# Patient Record
Sex: Female | Born: 1945 | Race: White | Hispanic: No | Marital: Married | Smoking: Former smoker
Health system: Southern US, Community
[De-identification: ages and names within clinical notes are randomized; demographics above are authoritative.]

## PROBLEM LIST (undated history)

## (undated) DIAGNOSIS — M1990 Unspecified osteoarthritis, unspecified site: Secondary | ICD-10-CM

## (undated) DIAGNOSIS — K5790 Diverticulosis of intestine, part unspecified, without perforation or abscess without bleeding: Secondary | ICD-10-CM

## (undated) DIAGNOSIS — J189 Pneumonia, unspecified organism: Secondary | ICD-10-CM

## (undated) DIAGNOSIS — Z972 Presence of dental prosthetic device (complete) (partial): Secondary | ICD-10-CM

## (undated) DIAGNOSIS — I5189 Other ill-defined heart diseases: Secondary | ICD-10-CM

## (undated) DIAGNOSIS — R7881 Bacteremia: Secondary | ICD-10-CM

## (undated) DIAGNOSIS — K219 Gastro-esophageal reflux disease without esophagitis: Secondary | ICD-10-CM

## (undated) DIAGNOSIS — E119 Type 2 diabetes mellitus without complications: Secondary | ICD-10-CM

## (undated) DIAGNOSIS — E785 Hyperlipidemia, unspecified: Secondary | ICD-10-CM

## (undated) DIAGNOSIS — I2510 Atherosclerotic heart disease of native coronary artery without angina pectoris: Secondary | ICD-10-CM

## (undated) DIAGNOSIS — N3281 Overactive bladder: Secondary | ICD-10-CM

## (undated) DIAGNOSIS — I493 Ventricular premature depolarization: Secondary | ICD-10-CM

## (undated) DIAGNOSIS — I6523 Occlusion and stenosis of bilateral carotid arteries: Secondary | ICD-10-CM

## (undated) DIAGNOSIS — Z8619 Personal history of other infectious and parasitic diseases: Secondary | ICD-10-CM

## (undated) DIAGNOSIS — Z9841 Cataract extraction status, right eye: Secondary | ICD-10-CM

## (undated) DIAGNOSIS — I679 Cerebrovascular disease, unspecified: Secondary | ICD-10-CM

## (undated) DIAGNOSIS — I700 Atherosclerosis of aorta: Secondary | ICD-10-CM

## (undated) DIAGNOSIS — H8110 Benign paroxysmal vertigo, unspecified ear: Secondary | ICD-10-CM

## (undated) DIAGNOSIS — E1140 Type 2 diabetes mellitus with diabetic neuropathy, unspecified: Secondary | ICD-10-CM

## (undated) DIAGNOSIS — I10 Essential (primary) hypertension: Secondary | ICD-10-CM

## (undated) DIAGNOSIS — F32A Depression, unspecified: Secondary | ICD-10-CM

## (undated) DIAGNOSIS — M51369 Other intervertebral disc degeneration, lumbar region without mention of lumbar back pain or lower extremity pain: Secondary | ICD-10-CM

## (undated) DIAGNOSIS — Z7982 Long term (current) use of aspirin: Secondary | ICD-10-CM

## (undated) DIAGNOSIS — B029 Zoster without complications: Secondary | ICD-10-CM

## (undated) DIAGNOSIS — R42 Dizziness and giddiness: Secondary | ICD-10-CM

## (undated) DIAGNOSIS — G9341 Metabolic encephalopathy: Secondary | ICD-10-CM

## (undated) HISTORY — PX: JOINT REPLACEMENT: SHX530

## (undated) HISTORY — PX: TONSILLECTOMY: SUR1361

## (undated) HISTORY — PX: TUBAL LIGATION: SHX77

## (undated) HISTORY — PX: APPENDECTOMY: SHX54

## (undated) HISTORY — PX: BACK SURGERY: SHX140

---

## 2004-05-02 ENCOUNTER — Ambulatory Visit: Payer: Self-pay | Admitting: Obstetrics and Gynecology

## 2004-05-16 ENCOUNTER — Ambulatory Visit: Payer: Self-pay | Admitting: Obstetrics and Gynecology

## 2004-11-20 ENCOUNTER — Ambulatory Visit: Payer: Self-pay | Admitting: Obstetrics and Gynecology

## 2005-05-06 ENCOUNTER — Ambulatory Visit: Payer: Self-pay | Admitting: Obstetrics and Gynecology

## 2006-06-17 ENCOUNTER — Ambulatory Visit: Payer: Self-pay | Admitting: Obstetrics and Gynecology

## 2006-11-24 ENCOUNTER — Ambulatory Visit: Payer: Self-pay | Admitting: Gastroenterology

## 2007-06-19 ENCOUNTER — Ambulatory Visit: Payer: Self-pay | Admitting: Obstetrics and Gynecology

## 2007-06-30 ENCOUNTER — Ambulatory Visit: Payer: Self-pay | Admitting: Obstetrics and Gynecology

## 2008-06-20 ENCOUNTER — Ambulatory Visit: Payer: Self-pay | Admitting: Obstetrics and Gynecology

## 2009-06-21 ENCOUNTER — Ambulatory Visit: Payer: Self-pay | Admitting: Obstetrics and Gynecology

## 2010-04-03 ENCOUNTER — Encounter: Payer: Self-pay | Admitting: Unknown Physician Specialty

## 2010-04-08 ENCOUNTER — Encounter: Payer: Self-pay | Admitting: Unknown Physician Specialty

## 2010-04-16 ENCOUNTER — Ambulatory Visit: Payer: Self-pay | Admitting: Gastroenterology

## 2010-05-08 ENCOUNTER — Encounter: Payer: Self-pay | Admitting: Unknown Physician Specialty

## 2010-06-08 ENCOUNTER — Encounter: Payer: Self-pay | Admitting: Unknown Physician Specialty

## 2010-07-30 ENCOUNTER — Ambulatory Visit: Payer: Self-pay | Admitting: Obstetrics and Gynecology

## 2010-09-19 ENCOUNTER — Emergency Department: Payer: Self-pay | Admitting: Emergency Medicine

## 2010-10-11 ENCOUNTER — Ambulatory Visit: Payer: Self-pay | Admitting: Internal Medicine

## 2010-11-26 ENCOUNTER — Ambulatory Visit: Payer: Self-pay | Admitting: Gastroenterology

## 2010-11-27 ENCOUNTER — Ambulatory Visit: Payer: Self-pay | Admitting: Gastroenterology

## 2011-01-21 ENCOUNTER — Ambulatory Visit: Payer: Self-pay | Admitting: Gastroenterology

## 2011-01-24 LAB — PATHOLOGY REPORT

## 2011-04-22 ENCOUNTER — Ambulatory Visit: Payer: Self-pay | Admitting: Unknown Physician Specialty

## 2011-04-22 LAB — PROTIME-INR: INR: 0.9

## 2011-04-22 LAB — URINALYSIS, COMPLETE
Bacteria: NONE SEEN
Glucose,UR: NEGATIVE mg/dL (ref 0–75)
Ketone: NEGATIVE
Nitrite: NEGATIVE
Protein: NEGATIVE
Specific Gravity: 1.006 (ref 1.003–1.030)
Squamous Epithelial: 1
WBC UR: 4 /HPF (ref 0–5)

## 2011-04-22 LAB — MRSA PCR SCREENING

## 2011-04-22 LAB — BASIC METABOLIC PANEL
Anion Gap: 9 (ref 7–16)
EGFR (Non-African Amer.): >60
Osmolality: 288 (ref 275–301)
Sodium: 143 mmol/L (ref 136–145)

## 2011-04-22 LAB — CBC
MCHC: 33.2 g/dL (ref 32.0–36.0)
RBC: 4.81 10*6/uL (ref 3.80–5.20)
RDW: 13 % (ref 11.5–14.5)

## 2011-04-22 LAB — APTT: Activated PTT: 25.9 secs (ref 23.6–35.9)

## 2011-05-06 ENCOUNTER — Inpatient Hospital Stay: Payer: Self-pay | Admitting: Unknown Physician Specialty

## 2011-05-06 LAB — ELECTROLYTE PANEL: Chloride: 110 mmol/L — ABNORMAL HIGH (ref 98–107)

## 2011-05-07 LAB — BASIC METABOLIC PANEL
Anion Gap: 9 (ref 7–16)
Calcium, Total: 8.1 mg/dL — ABNORMAL LOW (ref 8.5–10.1)
Co2: 26 mmol/L (ref 21–32)
Creatinine: 0.68 mg/dL (ref 0.60–1.30)
EGFR (African American): >60
EGFR (Non-African Amer.): >60
Glucose: 137 mg/dL — ABNORMAL HIGH (ref 65–99)
Osmolality: 276 (ref 275–301)
Potassium: 3.8 mmol/L (ref 3.5–5.1)

## 2011-05-07 LAB — HEMOGLOBIN: HGB: 12.4 g/dL (ref 12.0–16.0)

## 2011-05-08 LAB — HEMOGLOBIN: HGB: 11.1 g/dL — ABNORMAL LOW (ref 12.0–16.0)

## 2011-05-10 ENCOUNTER — Encounter: Payer: Self-pay | Admitting: Internal Medicine

## 2011-07-31 ENCOUNTER — Ambulatory Visit: Payer: Self-pay | Admitting: Obstetrics and Gynecology

## 2012-07-06 ENCOUNTER — Ambulatory Visit: Payer: Self-pay | Admitting: Physical Medicine and Rehabilitation

## 2012-07-31 ENCOUNTER — Ambulatory Visit: Payer: Self-pay | Admitting: Obstetrics and Gynecology

## 2013-05-28 DIAGNOSIS — I10 Essential (primary) hypertension: Secondary | ICD-10-CM | POA: Insufficient documentation

## 2013-08-02 ENCOUNTER — Ambulatory Visit: Payer: Self-pay | Admitting: Obstetrics and Gynecology

## 2013-08-13 DIAGNOSIS — R32 Unspecified urinary incontinence: Secondary | ICD-10-CM | POA: Insufficient documentation

## 2013-08-13 DIAGNOSIS — K219 Gastro-esophageal reflux disease without esophagitis: Secondary | ICD-10-CM | POA: Insufficient documentation

## 2014-05-01 NOTE — Op Note (Signed)
PATIENT NAME:  Madison Cuevas, Madison Cuevas MR#:  [id number] DATE OF BIRTH:  February 17, 1945  DATE OF PROCEDURE:  05/06/2011  PREOPERATIVE DIAGNOSIS: Degenerative arthritis, right knee.   POSTOPERATIVE DIAGNOSIS: Degenerative arthritis, right knee.   OPERATION: Stryker PS total knee replacement on the right.   SURGEON: Alda Berthold., MD   FIRST ASSISTANT: Thompson Grayer, PA-C   ANESTHESIA: Spinal.   HISTORY: The patient had a long history of symptomatic degenerative arthritis of the right knee. She had been refractory to conservative treatment. Due to persistent symptoms despite conservative treatment, she was brought in for total knee replacement on the right.   DESCRIPTION OF PROCEDURE: The patient was taken the Operating Room where satisfactory spinal anesthesia was achieved. A Foley was also inserted.   A tourniquet was applied to her right upper thigh. The right lower extremity was prepped and draped in the usual fashion for a procedure about the knee. The patient incidentally was given 2 grams of Kefzol IV prior to the start of the procedure.   Next, the right lower extremity was exsanguinated and the tourniquet was inflated. I then made a slightly curved longitudinal incision over the anterior aspect of the right knee joint.   Dissection was carried down through the subcutaneous tissue onto the extensor mechanism. I divided the vastus medialis in line with its fibers about a fingerbreadth above the superomedial pole of the patella. The joint was entered. Dissection was carried along the medial border of the patellar and patellar tendon. As the knee was flexed, the patella was everted and dislocated laterally.   Inspection of the knee joint revealed the patient had significant erosion of her medial and lateral femoral condyles and indentations over the medial and lateral tibial plateaus. There were osteophytes about the peripheral aspect of the distal femur and patella and then the  intercondylar notch.   We went in and removed the osteophytes. Meniscal remnants were excised. A 3/8-inch hole was made in the trochlear groove of the distal femur about a centimeter anterior to the insertion of the posterior cruciate ligament onto the distal femur. An intramedullary rod was inserted. On the distal end of the rod was an alignment jig and cutting block.   The distal femoral cutting block was affixed to the femur with smooth pins, and then the intramedullary rod and alignment jig were removed.   We used the +2 setting on the femoral cutting block to remove a little more bone than usual since the patient had about a 15-degree flexion deformity.   Next, we templated the distal femur for a #8 femoral component. We went ahead and made our starting holes for a #9 femoral component so as not to notch the femur anteriorly. Holes were placed in slight external rotation. After holes were made, the #8 femoral cutting block was impacted onto the distal femur, and anterior and posterior cuts were made along with the anterior and posterior chamfer cuts. The intercondylar area was notched out using the appropriate jig. The anterior cruciate ligament and posterior cruciate ligament were removed at this time.   The knee was hyperflexed. A 3/8-inch hole was made in the proximal tibia in the midline of the junction of the anterior one-third and posterior two-thirds. An intramedullary rod was inserted. On the proximal end of the rod was a 0-degree cutting block that was set for a cut 4 mm below the lowest portion of the lateral tibial plateau. After the cutting block was aligned, it was affixed to the  tibia with smooth pins, and then the intramedullary rod and jig were removed. A proximal tibial cut was made without difficulty. We templated the proximal tibia for a #9 tibial baseplate.   Trial components were inserted. We used the #9 tibial baseplate along with a #8 PS femoral component and an 8 mm spacer.    With the trial components in place, the knee almost extended fully. The tourniquet was released at this time. It had been up about an hour. While the tourniquet was down, I went ahead and removed 10 mm of bone from the retropatellar surface. We templated the retropatellar surface for a #9 Tibial component. Three holes were made for the pegs in the component. A trial resurfacing patellar button was inserted, and the patella seemed to track well.   After the tourniquet had been down about 10 minutes, the right lower extremity was re- exsanguinated and the tourniquet was inflated.  The trial components were removed except for the tibial baseplate. We left it in place to help with the alignment of our cruciate cut. The cruciate cut was made in the proximal tibia, and then jet lavage was used to remove blood from the cut cancellous surfaces. I did use the large elevator to slightly elevate the posterior capsular attachment to the distal femur.   The permanent total knee components were then inserted. The #9 tibial baseplate was impacted onto the proximal tibia with methylmethacrylate, and then the #8 PS femoral component was impacted onto the distal femur with methylmethacrylate. Excess glue was removed.   I then glued the #9 resurfacing retropatellar button to the retropatellar surface. Once again excess glue was removed. An 8mm polyethylene trial component was inserted, and then the knee was carried through a range of motion. It seemed to move well and was quite stable.   The trial 8 mm tibial spacer was removed, and then a permanent #9, 8 mm thick PS tibial insert was impacted onto the tibial baseplate. The knee was extended. The tourniquet was released. The tourniquet was up about 40 to 50 minutes the second time. Bleeding was controlled with coagulation cautery. The wound was irrigated again with GU irrigant. Two Autovac tubes were inserted in the depths of the wound. The extensor mechanism was closed  with #1 Ethibond and #1 Vicryl sutures. The subcutaneous was closed with 2-0 Vicryl and the skin with skin staples. Betadine was applied to the wound, followed by sterile dressing. I did apply four TENS pads about the knee. A Polar Care cooling pad was applied, followed by a knee immobilizer.   The patient was then transferred to her stretcher bed. She was taken to the recovery room in satisfactory condition. Blood loss was about 200 mL.   SUMMARY OF IMPLANTS USED:  We used a #8 Stryker PS femoral component, a #9 Stryker tibial baseplate, a #9 tibial polyethylene insert, and a #9 resurfacing patellar component.  ____________________________ Alda BertholdHarold B. Epiphany Seltzer Jr., MD hbk:cbb D: 05/06/2011 15:37:35 ET T: 05/06/2011 16:16:53 ET JOB#: [id number]  cc: Alda BertholdHarold B. Fredric Slabach Jr., MD, <Dictator> Randon GoldsmithHAROLD B Cable Fearn, JR MD ELECTRONICALLY SIGNED 05/11/2011 14:08

## 2014-05-01 NOTE — Discharge Summary (Signed)
PATIENT NAME:  Madison Cuevas, SHOFF MR#:  [id number] DATE OF BIRTH:  [birth year]  DATE OF ADMISSION:  05/06/2011 DATE OF DISCHARGE:  05/09/2011  ADMITTING DIAGNOSIS: Status post right total knee replacement for degenerative arthritis.   DISCHARGE DIAGNOSES:  1. Status post right total knee replacement for degenerative arthritis.  2. Hypoxia, improved. Chest x-ray negative.  3. Nausea, improved.  ATTENDING: Erin Sons, MD - [facility].   PROCEDURES: On 05/06/2011 the patient underwent right total knee replacement by Dr. Gavin Potters and assistant was Thompson Grayer, PA-C.   ANESTHESIA: Spinal.   ESTIMATED BLOOD LOSS: 200 mL.   OPERATIVE FINDINGS: Severe degenerative arthritis.   DRAINS: Autovac.  IMPLANTS: Stryker, Scorpio.   COMPLICATIONS: No complications occurred.   PATIENT HISTORY: Ms. Madison Cuevas is a 69 year old who has struggled with right knee pain for quite some time. She has had therapeutic injections and tried physical therapy. She continues to have knee pain and stairs are difficult for her. Synvisc I did not really help her significantly. She has been seeing [place] for around two years now concerning her right knee. X-rays of her knee revealed lateral joint space collapse with subchondral sclerosis and osteophyte formation. She also has significant osteophyte formation in the patellofemoral region.   ALLERGIES AND ADVERSE REACTIONS: None.   PAST MEDICAL HISTORY:  1. Hyperlipidemia. 2. Prediabetic.  3. Chickenpox.  4. Measles. 5. Mumps. 6. Arthritis.   PHYSICAL EXAMINATION: HEART: Regular rate and rhythm. LUNGS: Clear to auscultation. RIGHT KNEE: Slight flexion contracture with flexion to around 105 degrees. Collateral ligaments are stable. There is some valgus deformity to the knee. She has distal nerve vascular components of the leg grossly intact.   HOSPITAL COURSE: The patient underwent the aforementioned procedure by Dr. Gavin Potters on 05/06/2011 without  complication and was transferred to the postanesthesia care unit and then the orthopedic floor in stable condition. The patient at first did have some nausea but this seemed to improve. The patient was treated with Lovenox, TED hose, and compression boots for deep vein thrombosis prophylaxis. The patient did have some tachycardia while here but this was par for the course for her. On postoperative day two, her Foley catheter was removed but did have to be reinserted for some urinary recent retention. This would be removed again on postoperative day three and she was able to urinate then. She also passed some stool on postoperative day three. Her hemoglobin ended up being 11.1 and was not low enough to need any transfusion. She had undergone dressing change of her right knee on postoperative day three and was found to have staples intact and no purulent drainage. She did pass some stool on postoperative day three. At first she had to be on some nasal cannula oxygen because of low saturations and chest x-ray was checked, but it was read by Dr. Celesta Aver showing no acute changes. Her nasal cannula oxygen could be discontinued. She would work with physical therapy on multiple occasions while here and with assistance bend her knee to 103 degrees. She also had ambulated 95 feet on her last day here.   CONDITION AT DISCHARGE: Stable.   DISPOSITION: [facility].   DISCHARGE MEDICATIONS:  1. Dulcolax suppository 10 mg rectal daily as needed for constipation.  2. Milk of Magnesia 60 mL oral daily as needed for constipation.  3. Nucynta 75 mg oral every six hours as needed for pain.  4. Tylenol 1000 mg every six hours as needed for pain or fever.  5. Celebrex  200 mg per oral twice a day. When she goes home this can be discontinued and she can restart her meloxicam. 6. Progesterone 200 mg oral daily.  7. Lovenox 40 mg subcutaneous injection daily, stop in 14 days from 05/09/2011.  8. Zocor 20 mg oral at  bedtime. 9. Protonix 40 mg oral daily. 10. Detrol LA 4 mg oral daily. This may be held if she has any difficulty urinating. 11. Colace 100 mg oral twice a day.   DISCHARGE INSTRUCTIONS AND FOLLOW-UP:  1. Physical therapy and occupational therapy consult. The patient is weight-bearing as tolerated on the surgical leg.  2. TENS unit is to be applied to the right knee for pain relief. Also apply Polar Care unit.  3. She is to wear TED hose thigh-high during the day for a month after surgery.  4. Regular diet.  5. Change right knee dressing in sterile fashion as needed for drainage.  6. The patient is to follow up with Dr. Erin SonsHarold Kernodle on 05/21/2011 at 10:45 a.m. for right knee staple removal. Please call our office with any questions. ____________________________ Letta MoynahanJonathan R. MeridianPrentice, GeorgiaPA jrp:slb D: 05/09/2011 16:46:09 ET T: 05/09/2011 17:24:37 ET JOB#: [id number]  cc: Letta MoynahanJonathan R. Clyde CanterburyPrentice, GeorgiaPA, <Dictator> Letta MoynahanJONATHAN R Jojo Geving PA ELECTRONICALLY SIGNED 05/10/2011 7:59

## 2014-05-05 DIAGNOSIS — M51369 Other intervertebral disc degeneration, lumbar region without mention of lumbar back pain or lower extremity pain: Secondary | ICD-10-CM | POA: Insufficient documentation

## 2014-05-05 DIAGNOSIS — M5416 Radiculopathy, lumbar region: Secondary | ICD-10-CM | POA: Insufficient documentation

## 2014-08-18 ENCOUNTER — Other Ambulatory Visit: Payer: Self-pay | Admitting: Obstetrics and Gynecology

## 2014-08-18 DIAGNOSIS — Z1231 Encounter for screening mammogram for malignant neoplasm of breast: Secondary | ICD-10-CM

## 2014-08-19 ENCOUNTER — Other Ambulatory Visit: Payer: Self-pay | Admitting: Obstetrics and Gynecology

## 2014-08-19 ENCOUNTER — Ambulatory Visit
Admission: RE | Admit: 2014-08-19 | Discharge: 2014-08-19 | Disposition: A | Discharge status: Home / Self Care | Payer: Medicare PPO | Source: Ambulatory Visit | Attending: Obstetrics and Gynecology | Admitting: Obstetrics and Gynecology

## 2014-08-19 DIAGNOSIS — Z1231 Encounter for screening mammogram for malignant neoplasm of breast: Secondary | ICD-10-CM

## 2015-08-17 ENCOUNTER — Encounter: Payer: Self-pay | Admitting: *Deleted

## 2015-08-21 ENCOUNTER — Ambulatory Visit
Admission: RE | Admit: 2015-08-21 | Discharge: 2015-08-21 | Disposition: A | Discharge status: Home / Self Care | Payer: Medicare Other | Source: Ambulatory Visit | Attending: Gastroenterology | Admitting: Gastroenterology

## 2015-08-21 ENCOUNTER — Ambulatory Visit: Payer: Medicare Other | Admitting: Anesthesiology

## 2015-08-21 ENCOUNTER — Encounter: Payer: Self-pay | Admitting: *Deleted

## 2015-08-21 ENCOUNTER — Encounter
Admission: RE | Disposition: A | Discharge status: Home / Self Care | Payer: Self-pay | Source: Ambulatory Visit | Attending: Gastroenterology

## 2015-08-21 DIAGNOSIS — E119 Type 2 diabetes mellitus without complications: Secondary | ICD-10-CM | POA: Insufficient documentation

## 2015-08-21 DIAGNOSIS — E785 Hyperlipidemia, unspecified: Secondary | ICD-10-CM | POA: Insufficient documentation

## 2015-08-21 DIAGNOSIS — I10 Essential (primary) hypertension: Secondary | ICD-10-CM | POA: Diagnosis not present

## 2015-08-21 DIAGNOSIS — Z87891 Personal history of nicotine dependence: Secondary | ICD-10-CM | POA: Diagnosis not present

## 2015-08-21 DIAGNOSIS — K219 Gastro-esophageal reflux disease without esophagitis: Secondary | ICD-10-CM | POA: Diagnosis not present

## 2015-08-21 DIAGNOSIS — Z7984 Long term (current) use of oral hypoglycemic drugs: Secondary | ICD-10-CM | POA: Insufficient documentation

## 2015-08-21 DIAGNOSIS — M1990 Unspecified osteoarthritis, unspecified site: Secondary | ICD-10-CM | POA: Insufficient documentation

## 2015-08-21 DIAGNOSIS — Z79899 Other long term (current) drug therapy: Secondary | ICD-10-CM | POA: Insufficient documentation

## 2015-08-21 DIAGNOSIS — K5730 Diverticulosis of large intestine without perforation or abscess without bleeding: Secondary | ICD-10-CM | POA: Diagnosis not present

## 2015-08-21 DIAGNOSIS — Z86010 Personal history of colonic polyps: Secondary | ICD-10-CM | POA: Diagnosis present

## 2015-08-21 HISTORY — PX: COLONOSCOPY WITH PROPOFOL: SHX5780

## 2015-08-21 HISTORY — DX: Unspecified osteoarthritis, unspecified site: M19.90

## 2015-08-21 HISTORY — DX: Type 2 diabetes mellitus without complications: E11.9

## 2015-08-21 HISTORY — DX: Zoster without complications: B02.9

## 2015-08-21 HISTORY — DX: Hyperlipidemia, unspecified: E78.5

## 2015-08-21 HISTORY — DX: Essential (primary) hypertension: I10

## 2015-08-21 HISTORY — DX: Personal history of other infectious and parasitic diseases: Z86.19

## 2015-08-21 LAB — GLUCOSE, CAPILLARY: GLUCOSE-CAPILLARY: 219 mg/dL — AB (ref 65–99)

## 2015-08-21 SURGERY — COLONOSCOPY WITH PROPOFOL
Anesthesia: General

## 2015-08-21 MED ORDER — SODIUM CHLORIDE 0.9 % IV SOLN
INTRAVENOUS | Status: DC
  Administered 2015-08-21: 08:00:00 via INTRAVENOUS

## 2015-08-21 MED ORDER — MIDAZOLAM HCL 5 MG/5ML IJ SOLN
INTRAMUSCULAR | Status: DC | PRN
  Administered 2015-08-21: 1 mg via INTRAVENOUS

## 2015-08-21 MED ORDER — PROPOFOL 10 MG/ML IV BOLUS
INTRAVENOUS | Status: DC | PRN
  Administered 2015-08-21: 20 mg via INTRAVENOUS
  Administered 2015-08-21: 50 mg via INTRAVENOUS
  Administered 2015-08-21: 30 mg via INTRAVENOUS

## 2015-08-21 MED ORDER — SODIUM CHLORIDE 0.9 % IV SOLN
INTRAVENOUS | Status: DC
  Administered 2015-08-21: 1000 mL via INTRAVENOUS

## 2015-08-21 MED ORDER — PROPOFOL 500 MG/50ML IV EMUL
INTRAVENOUS | Status: DC | PRN
  Administered 2015-08-21: 140 ug/kg/min via INTRAVENOUS

## 2015-08-21 MED ORDER — FENTANYL CITRATE (PF) 100 MCG/2ML IJ SOLN
25.0000 ug | INTRAMUSCULAR | Status: DC | PRN
  Administered 2015-08-21: 50 ug via INTRAVENOUS

## 2015-08-21 MED ORDER — ONDANSETRON HCL 4 MG/2ML IJ SOLN: 4.0000 mg | Freq: Once | INTRAMUSCULAR | Status: DC | PRN

## 2015-08-21 NOTE — H&P (Signed)
Outpatient short stay form Pre-procedure 08/21/2015 7:40 AM Madison DeemMartin U Kallum Jorgensen MD  Primary Physician: Dr Kandyce RudMarcus Babaoff  Reason for visit:  Colonoscopy  History of present illness:  Patient is a 70 year old female presenting today for colonoscopy. She has personal history of adenomatous colon polyps. She tolerated her prep well. She takes no aspirin or blood thinning agents.    Current Facility-Administered Medications:  .  0.9 %  sodium chloride infusion, , Intravenous, Continuous, Madison DeemMartin U Youcef Klas, MD, Last Rate: 20 mL/hr at 08/21/15 0726, 1,000 mL at 08/21/15 0726 .  0.9 %  sodium chloride infusion, , Intravenous, Continuous, Madison DeemMartin U Merrin Mcvicker, MD .  fentaNYL (SUBLIMAZE) injection 25 mcg, 25 mcg, Intravenous, Q5 min PRN, Yves DillPaul Carroll, MD .  ondansetron [facility](ZOFRAN) injection 4 mg, 4 mg, Intravenous, Once PRN, Yves DillPaul Carroll, MD  Prescriptions Prior to Admission  Medication Sig Dispense Refill Last Dose  . Calcium-Vitamin D (CALTRATE 600 PLUS-VIT D PO) Take 1 tablet by mouth 2 (two) times daily.     Marland Kitchen. glipiZIDE (GLUCOTROL XL) 5 MG 24 hr tablet Take 5 mg by mouth daily with breakfast.     . glucosamine-chondroitin 500-400 MG tablet Take 1 tablet by mouth 3 (three) times daily.     Marland Kitchen. losartan (COZAAR) 50 MG tablet Take 50 mg by mouth daily.     . metFORMIN (GLUCOPHAGE) 500 MG tablet Take by mouth 2 (two) times daily with a meal.     . MULTIPLE VITAMIN PO Take 1 tablet by mouth daily.     Marland Kitchen. omega-3 fish oil (MAXEPA) 1000 MG CAPS capsule Take 1 capsule by mouth daily.     . pantoprazole (PROTONIX) 40 MG tablet Take 40 mg by mouth 2 (two) times daily.     . progesterone (PROMETRIUM) 100 MG capsule Take 100 mg by mouth daily.     . simvastatin (ZOCOR) 20 MG tablet Take 20 mg by mouth daily.     Marland Kitchen. tolterodine (DETROL LA) 4 MG 24 hr capsule Take 4 mg by mouth daily.     Marland Kitchen. venlafaxine (EFFEXOR) 75 MG tablet Take 75 mg by mouth daily.     . [DISCONTINUED] Triamcinolone Acetonide (TRIAMCINOLONE 0.1 %  CREAM : EUCERIN) CREA Apply 1 application topically 2 (two) times daily.        No Known Allergies   Past Medical History:  Diagnosis Date  . Diabetes mellitus without complication (HCC)   . Herpes zoster   . History of chicken pox   . Hyperlipidemia   . Hypertension   . Osteoarthrosis     Review of systems:      Physical Exam    Heart and lungs: Regular rate and rhythm without rub or gallop, lungs are bilaterally clear.    HEENT: Normocephalic atraumatic eyes are anicteric    Other:     Pertinant exam for procedure: Soft nontender nondistended bowel sounds positive normoactive. I have discussed the risks benefits and complications of procedures to include not limited to bleeding, infection, perforation and the risk of sedation and the patient wishes to proceed.    Planned proceedures: Colonoscopy and indicated procedures.    Madison DeemMartin U Destinie Thornsberry, MD Gastroenterology 08/21/2015  7:40 AM

## 2015-08-21 NOTE — Transfer of Care (Signed)
Immediate Anesthesia Transfer of Care Note  Patient: Madison CannerLinda G Cuevas  Procedure(s) Performed: Procedure(s): COLONOSCOPY WITH PROPOFOL (N/A)  Patient Location: PACU  Anesthesia Type:General  Level of Consciousness: sedated  Airway & Oxygen Therapy: Patient Spontanous Breathing and Patient connected to nasal cannula oxygen  Post-op Assessment: Report given to RN and Post -op Vital signs reviewed and stable  Post vital signs: Reviewed and stable  Last Vitals:  Vitals:   08/21/15 0709  BP: 138/78  Pulse: 88  Resp: 18  Temp: 36.4 C    Last Pain:  Vitals:   08/21/15 0709  TempSrc: Tympanic         Complications: No apparent anesthesia complications

## 2015-08-21 NOTE — Op Note (Signed)
[facility] Gastroenterology Patient Name: Madison RhineLinda Lao Procedure Date: 08/21/2015 7:44 AM MRN: [id number] Account #: [id number] Date of Birth: 1945-10-03 Admit Type: Outpatient Age: [id number] Room[facility] END[facility] 3 Gender: Female Note Status: Finalized Procedure:            Colonoscopy Indications:          Personal history of colonic polyps Providers:            Christena DeemMartin U. Khristina Janota, MD Referring MD:         Hassell HalimMarcus E. Babaoff MD (Referring MD) Medicines:            Monitored Anesthesia Care Complications:        No immediate complications. Procedure:            Pre-Anesthesia Assessment:                       - ASA Grade Assessment: II - A patient with mild                        systemic disease.                       After obtaining informed consent, the colonoscope was                        passed under direct vision. Throughout the procedure,                        the patient's blood pressure, pulse, and oxygen                        saturations were monitored continuously. The                        Colonoscope was introduced through the anus and                        advanced to the the cecum, identified by appendiceal                        orifice and ileocecal valve. The colonoscopy was                        performed with moderate difficulty. The quality of the                        bowel preparation was good. Findings:      Multiple small to medium-mouthed diverticula were found in the sigmoid       colon, descending colon, transverse colon and ascending colon.      The retroflexed view of the distal rectum and anal verge was normal and       showed no anal or rectal abnormalities.      The digital rectal exam was normal. Impression:           - Diverticulosis in the sigmoid colon, in the                        descending colon, in the transverse colon and in the  ascending colon.                       - The distal rectum and anal  verge are normal on                        retroflexion view.                       - No specimens collected. Recommendation:       - Discharge patient to home.                       - Repeat colonoscopy in 5 years for surveillance. Procedure Code(s):    --- Professional ---                       [phone], Colonoscopy, flexible; diagnostic, including                        collection of specimen(s) by brushing or washing, when                        performed (separate procedure) Diagnosis Code(s):    --- Professional ---                       Z86.010, Personal history of colonic polyps                       K57.30, Diverticulosis of large intestine without                        perforation or abscess without bleeding CPT copyright 2016 American Medical Association. All rights reserved. The codes documented in this report are preliminary and upon coder review may  be revised to meet current compliance requirements. Christena DeemMartin U Najma Bozarth, MD 08/21/2015 8:11:07 AM This report has been signed electronically. Number of Addenda: 0 Note Initiated On: 08/21/2015 7:44 AM Scope Withdrawal Time: 0 hours 8 minutes 4 seconds  Total Procedure Duration: 0 hours 17 minutes 26 seconds       [facility]

## 2015-08-21 NOTE — Anesthesia Preprocedure Evaluation (Addendum)
Anesthesia Evaluation  Patient identified by MRN, date of birth, ID band Patient awake    Reviewed: Allergy & Precautions, NPO status , Patient's Chart, lab work & pertinent test results  Airway Mallampati: III  TM Distance: <3 FB     Dental  (+) Partial Lower   Pulmonary former smoker,    Pulmonary exam normal        Cardiovascular hypertension, Pt. on medications Normal cardiovascular exam     Neuro/Psych negative neurological ROS  negative psych ROS   GI/Hepatic GERD  Medicated and Controlled,Hx of colon polyps   Endo/Other  diabetes, Well Controlled, Type 2, Oral Hypoglycemic Agents  Renal/GU   negative genitourinary   Musculoskeletal  (+) Arthritis ,   Abdominal Normal abdominal exam  (+)   Peds negative pediatric ROS (+)  Hematology negative hematology ROS (+)   Anesthesia Other Findings   Reproductive/Obstetrics                            Anesthesia Physical Anesthesia Plan  ASA: II  Anesthesia Plan: General   Post-op Pain Management:    Induction: Intravenous  Airway Management Planned: Nasal Cannula  Additional Equipment:   Intra-op Plan:   Post-operative Plan:   Informed Consent: I have reviewed the patients History and Physical, chart, labs and discussed the procedure including the risks, benefits and alternatives for the proposed anesthesia with the patient or authorized representative who has indicated his/her understanding and acceptance.   Dental advisory given  Plan Discussed with: CRNA and Surgeon  Anesthesia Plan Comments:         Anesthesia Quick Evaluation

## 2015-08-21 NOTE — Anesthesia Postprocedure Evaluation (Signed)
Anesthesia Post Note  Patient: Madison CannerLinda G Cuevas  Procedure(s) Performed: Procedure(s) (LRB): COLONOSCOPY WITH PROPOFOL (N/A)  Patient location during evaluation: PACU Anesthesia Type: General Level of consciousness: awake and alert and oriented Pain management: pain level controlled Vital Signs Assessment: post-procedure vital signs reviewed and stable Respiratory status: spontaneous breathing Cardiovascular status: blood pressure returned to baseline Anesthetic complications: no    Last Vitals:  Vitals:   08/21/15 0709 08/21/15 0817  BP: 138/78 108/60  Pulse: 88 80  Resp: 18 12  Temp: 36.4 C (!) 35.9 C    Last Pain:  Vitals:   08/21/15 0817  TempSrc: Tympanic                 Shelaine Frie

## 2015-08-22 ENCOUNTER — Encounter: Payer: Self-pay | Admitting: Gastroenterology

## 2015-09-12 ENCOUNTER — Other Ambulatory Visit: Payer: Self-pay | Admitting: Family Medicine

## 2015-09-12 DIAGNOSIS — F325 Major depressive disorder, single episode, in full remission: Secondary | ICD-10-CM | POA: Insufficient documentation

## 2015-09-12 DIAGNOSIS — Z1231 Encounter for screening mammogram for malignant neoplasm of breast: Secondary | ICD-10-CM

## 2015-09-28 ENCOUNTER — Ambulatory Visit
Admission: RE | Admit: 2015-09-28 | Discharge: 2015-09-28 | Disposition: A | Discharge status: Home / Self Care | Payer: Medicare Other | Source: Ambulatory Visit | Attending: Family Medicine | Admitting: Family Medicine

## 2015-09-28 DIAGNOSIS — Z1231 Encounter for screening mammogram for malignant neoplasm of breast: Secondary | ICD-10-CM

## 2016-08-21 ENCOUNTER — Other Ambulatory Visit: Payer: Self-pay | Admitting: Family Medicine

## 2016-08-21 DIAGNOSIS — Z1231 Encounter for screening mammogram for malignant neoplasm of breast: Secondary | ICD-10-CM

## 2016-10-08 ENCOUNTER — Ambulatory Visit
Admission: RE | Admit: 2016-10-08 | Discharge: 2016-10-08 | Disposition: A | Discharge status: Home / Self Care | Payer: Medicare Other | Source: Ambulatory Visit | Attending: Family Medicine | Admitting: Family Medicine

## 2016-10-08 DIAGNOSIS — Z1231 Encounter for screening mammogram for malignant neoplasm of breast: Secondary | ICD-10-CM | POA: Insufficient documentation

## 2017-03-18 DIAGNOSIS — E1142 Type 2 diabetes mellitus with diabetic polyneuropathy: Secondary | ICD-10-CM | POA: Insufficient documentation

## 2017-09-04 ENCOUNTER — Other Ambulatory Visit: Payer: Self-pay | Admitting: Family Medicine

## 2017-09-04 DIAGNOSIS — Z1231 Encounter for screening mammogram for malignant neoplasm of breast: Secondary | ICD-10-CM

## 2017-10-09 ENCOUNTER — Ambulatory Visit
Admission: RE | Admit: 2017-10-09 | Discharge: 2017-10-09 | Disposition: A | Discharge status: Home / Self Care | Payer: Medicare Other | Source: Ambulatory Visit | Attending: Family Medicine | Admitting: Family Medicine

## 2017-10-09 DIAGNOSIS — Z1231 Encounter for screening mammogram for malignant neoplasm of breast: Secondary | ICD-10-CM | POA: Insufficient documentation

## 2018-07-01 ENCOUNTER — Other Ambulatory Visit: Payer: Self-pay | Admitting: Family Medicine

## 2018-07-01 DIAGNOSIS — Z1231 Encounter for screening mammogram for malignant neoplasm of breast: Secondary | ICD-10-CM

## 2018-09-03 ENCOUNTER — Encounter: Payer: Self-pay | Admitting: *Deleted

## 2018-09-03 ENCOUNTER — Other Ambulatory Visit: Payer: Self-pay

## 2018-09-04 ENCOUNTER — Other Ambulatory Visit
Admission: RE | Admit: 2018-09-04 | Discharge: 2018-09-04 | Disposition: A | Discharge status: Home / Self Care | Payer: Medicare Other | Source: Ambulatory Visit | Attending: Ophthalmology | Admitting: Ophthalmology

## 2018-09-04 DIAGNOSIS — Z01812 Encounter for preprocedural laboratory examination: Secondary | ICD-10-CM | POA: Diagnosis present

## 2018-09-04 DIAGNOSIS — Z20828 Contact with and (suspected) exposure to other viral communicable diseases: Secondary | ICD-10-CM | POA: Diagnosis not present

## 2018-09-05 LAB — SARS CORONAVIRUS 2 (TAT 6-24 HRS): SARS Coronavirus 2: NEGATIVE

## 2018-09-07 NOTE — Discharge Instructions (Signed)

## 2018-09-09 ENCOUNTER — Ambulatory Visit
Admission: RE | Admit: 2018-09-09 | Discharge: 2018-09-09 | Disposition: A | Discharge status: Home / Self Care | Payer: Medicare Other | Attending: Ophthalmology | Admitting: Ophthalmology

## 2018-09-09 ENCOUNTER — Encounter
Admission: RE | Disposition: A | Discharge status: Home / Self Care | Payer: Self-pay | Source: Home / Self Care | Attending: Ophthalmology

## 2018-09-09 ENCOUNTER — Ambulatory Visit: Payer: Medicare Other | Admitting: Anesthesiology

## 2018-09-09 DIAGNOSIS — F419 Anxiety disorder, unspecified: Secondary | ICD-10-CM | POA: Diagnosis not present

## 2018-09-09 DIAGNOSIS — Z7984 Long term (current) use of oral hypoglycemic drugs: Secondary | ICD-10-CM | POA: Insufficient documentation

## 2018-09-09 DIAGNOSIS — E7800 Pure hypercholesterolemia, unspecified: Secondary | ICD-10-CM | POA: Diagnosis not present

## 2018-09-09 DIAGNOSIS — E1140 Type 2 diabetes mellitus with diabetic neuropathy, unspecified: Secondary | ICD-10-CM | POA: Diagnosis not present

## 2018-09-09 DIAGNOSIS — Z87891 Personal history of nicotine dependence: Secondary | ICD-10-CM | POA: Diagnosis not present

## 2018-09-09 DIAGNOSIS — Z96659 Presence of unspecified artificial knee joint: Secondary | ICD-10-CM | POA: Diagnosis not present

## 2018-09-09 DIAGNOSIS — Z79899 Other long term (current) drug therapy: Secondary | ICD-10-CM | POA: Insufficient documentation

## 2018-09-09 DIAGNOSIS — K219 Gastro-esophageal reflux disease without esophagitis: Secondary | ICD-10-CM | POA: Diagnosis not present

## 2018-09-09 DIAGNOSIS — H2511 Age-related nuclear cataract, right eye: Secondary | ICD-10-CM | POA: Insufficient documentation

## 2018-09-09 DIAGNOSIS — E1136 Type 2 diabetes mellitus with diabetic cataract: Secondary | ICD-10-CM | POA: Insufficient documentation

## 2018-09-09 DIAGNOSIS — I10 Essential (primary) hypertension: Secondary | ICD-10-CM | POA: Insufficient documentation

## 2018-09-09 HISTORY — DX: Presence of dental prosthetic device (complete) (partial): Z97.2

## 2018-09-09 HISTORY — PX: CATARACT EXTRACTION W/PHACO: SHX586

## 2018-09-09 HISTORY — DX: Gastro-esophageal reflux disease without esophagitis: K21.9

## 2018-09-09 LAB — GLUCOSE, CAPILLARY: Glucose-Capillary: 178 mg/dL — ABNORMAL HIGH (ref 70–99)

## 2018-09-09 SURGERY — PHACOEMULSIFICATION, CATARACT, WITH IOL INSERTION
Anesthesia: Monitor Anesthesia Care | Site: Eye | Laterality: Right

## 2018-09-09 MED ORDER — EPINEPHRINE PF 1 MG/ML IJ SOLN
INTRAOCULAR | Status: DC | PRN
  Administered 2018-09-09: 10:00:00 62 mL via OPHTHALMIC

## 2018-09-09 MED ORDER — TETRACAINE HCL 0.5 % OP SOLN
1.0000 [drp] | OPHTHALMIC | Status: DC | PRN
  Administered 2018-09-09 (×3): 1 [drp] via OPHTHALMIC

## 2018-09-09 MED ORDER — MOXIFLOXACIN HCL 0.5 % OP SOLN
1.0000 [drp] | OPHTHALMIC | Status: DC | PRN
  Administered 2018-09-09 (×3): 1 [drp] via OPHTHALMIC

## 2018-09-09 MED ORDER — LIDOCAINE HCL (PF) 2 % IJ SOLN
INTRAOCULAR | Status: DC | PRN
  Administered 2018-09-09: 1 mL

## 2018-09-09 MED ORDER — ONDANSETRON HCL 4 MG/2ML IJ SOLN
INTRAMUSCULAR | Status: DC | PRN
  Administered 2018-09-09: 4 mg via INTRAVENOUS

## 2018-09-09 MED ORDER — ARMC OPHTHALMIC DILATING DROPS
1.0000 "application " | OPHTHALMIC | Status: DC | PRN
  Administered 2018-09-09 (×3): 1 via OPHTHALMIC

## 2018-09-09 MED ORDER — BRIMONIDINE TARTRATE-TIMOLOL 0.2-0.5 % OP SOLN
OPHTHALMIC | Status: DC | PRN
  Administered 2018-09-09: 1 [drp] via OPHTHALMIC

## 2018-09-09 MED ORDER — FENTANYL CITRATE (PF) 100 MCG/2ML IJ SOLN
INTRAMUSCULAR | Status: DC | PRN
  Administered 2018-09-09 (×2): 50 ug via INTRAVENOUS

## 2018-09-09 MED ORDER — NA HYALUR & NA CHOND-NA HYALUR 0.4-0.35 ML IO KIT
PACK | INTRAOCULAR | Status: DC | PRN
  Administered 2018-09-09: 1 mL via INTRAOCULAR

## 2018-09-09 MED ORDER — CEFUROXIME OPHTHALMIC INJECTION 1 MG/0.1 ML
INJECTION | OPHTHALMIC | Status: DC | PRN
  Administered 2018-09-09: 0.1 mL via INTRACAMERAL

## 2018-09-09 MED ORDER — MIDAZOLAM HCL 2 MG/2ML IJ SOLN
INTRAMUSCULAR | Status: DC | PRN
  Administered 2018-09-09 (×2): 1 mg via INTRAVENOUS

## 2018-09-09 SURGICAL SUPPLY — 16 items
CANNULA ANT/CHMB 27G (MISCELLANEOUS) ×1 IMPLANT
CANNULA ANT/CHMB 27GA (MISCELLANEOUS) ×4 IMPLANT
GLOVE SURG LX 7.5 STRW (GLOVE) ×1
GLOVE SURG LX STRL 7.5 STRW (GLOVE) ×1 IMPLANT
GLOVE SURG TRIUMPH 8.0 PF LTX (GLOVE) ×2 IMPLANT
GOWN STRL REUS W/ TWL LRG LVL3 (GOWN DISPOSABLE) ×2 IMPLANT
GOWN STRL REUS W/TWL LRG LVL3 (GOWN DISPOSABLE) ×2
LENS IOL TECNIS ITEC 16.0 (Intraocular Lens) ×1 IMPLANT
MARKER SKIN DUAL TIP RULER LAB (MISCELLANEOUS) ×2 IMPLANT
PACK CATARACT BRASINGTON (MISCELLANEOUS) ×2 IMPLANT
PACK EYE AFTER SURG (MISCELLANEOUS) ×2 IMPLANT
PACK OPTHALMIC (MISCELLANEOUS) ×2 IMPLANT
SYR 3ML LL SCALE MARK (SYRINGE) ×2 IMPLANT
SYR TB 1ML LUER SLIP (SYRINGE) ×2 IMPLANT
WATER STERILE IRR 500ML POUR (IV SOLUTION) ×2 IMPLANT
WIPE NON LINTING 3.25X3.25 (MISCELLANEOUS) ×2 IMPLANT

## 2018-09-09 NOTE — H&P (Signed)
The History and Physical notes are on paper, have been signed, and are to be scanned. The patient remains stable.  She has recovered from a recent cellulitis of her leg treated with PO abx.  and unchanged from the H&P.   Previous H&P reviewed, patient examined, and there are no changes.  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  I attest that the following are true and accurate to the best of my knowledge: 1. The patient's impairment of visual function is believed not to be correctable with a tolerable change in glasses or contact lenses. 2. Cataract (in the operative eye) is believed to be significantly contributing to the patient's visual impairment. 3. The patient desires surgical correction; the risks, benefits, and alternatives have been explained, and questions have been answered to the patients satisfaction.  A reasonable expectation exists that cataract surgery will significantly improve both the visual and functional status of the patient.  Jorge Retz 09/09/2018 9:10 AM

## 2018-09-09 NOTE — Anesthesia Preprocedure Evaluation (Signed)
Anesthesia Evaluation  Patient identified by MRN, date of birth, ID band Patient awake    History of Anesthesia Complications Negative for: history of anesthetic complications  Airway Mallampati: I  TM Distance: >3 FB Neck ROM: Full    Dental no notable dental hx.    Pulmonary neg pulmonary ROS, former smoker,    Pulmonary exam normal        Cardiovascular hypertension, Pt. on medications Normal cardiovascular exam     Neuro/Psych negative neurological ROS     GI/Hepatic GERD  Medicated,  Endo/Other  diabetes, Type 2  Renal/GU      Musculoskeletal negative musculoskeletal ROS (+)   Abdominal   Peds  Hematology negative hematology ROS (+)   Anesthesia Other Findings   Reproductive/Obstetrics                             Anesthesia Physical Anesthesia Plan  ASA: II  Anesthesia Plan: MAC   Post-op Pain Management:    Induction: Intravenous  PONV Risk Score and Plan: 2 and TIVA and Midazolam  Airway Management Planned: Nasal Cannula  Additional Equipment:   Intra-op Plan:   Post-operative Plan:   Informed Consent: I have reviewed the patients History and Physical, chart, labs and discussed the procedure including the risks, benefits and alternatives for the proposed anesthesia with the patient or authorized representative who has indicated his/her understanding and acceptance.       Plan Discussed with:   Anesthesia Plan Comments:         Anesthesia Quick Evaluation

## 2018-09-09 NOTE — Transfer of Care (Signed)
Immediate Anesthesia Transfer of Care Note  Patient: Madison Cuevas  Procedure(s) Performed: CATARACT EXTRACTION PHACO AND INTRAOCULAR LENS PLACEMENT (IOC)  RIGHT DIABETIC  01:01.5  16.2%  9.99 (Right Eye)  Patient Location: PACU  Anesthesia Type: MAC  Level of Consciousness: awake, alert  and patient cooperative  Airway and Oxygen Therapy: Patient Spontanous Breathing and Patient connected to supplemental oxygen  Post-op Assessment: Post-op Vital signs reviewed, Patient's Cardiovascular Status Stable, Respiratory Function Stable, Patent Airway and No signs of Nausea or vomiting  Post-op Vital Signs: Reviewed and stable  Complications: No apparent anesthesia complications

## 2018-09-09 NOTE — Anesthesia Postprocedure Evaluation (Signed)
Anesthesia Post Note  Patient: Madison Cuevas  Procedure(s) Performed: CATARACT EXTRACTION PHACO AND INTRAOCULAR LENS PLACEMENT (IOC)  RIGHT DIABETIC  01:01.5  16.2%  9.99 (Right Eye)  Patient location during evaluation: PACU Anesthesia Type: MAC Level of consciousness: awake and alert Pain management: pain level controlled Vital Signs Assessment: post-procedure vital signs reviewed and stable Respiratory status: spontaneous breathing, nonlabored ventilation, respiratory function stable and patient connected to nasal cannula oxygen Cardiovascular status: stable and blood pressure returned to baseline Postop Assessment: no apparent nausea or vomiting Anesthetic complications: no    Adele Barthel Joaopedro Eschbach

## 2018-09-09 NOTE — Anesthesia Procedure Notes (Signed)
Procedure Name: MAC Performed by: Izetta Dakin, CRNA Pre-anesthesia Checklist: Timeout performed, Patient being monitored, Suction available, Emergency Drugs available and Patient identified Patient Re-evaluated:Patient Re-evaluated prior to induction Oxygen Delivery Method: Nasal cannula

## 2018-09-09 NOTE — Op Note (Signed)
LOCATION:  [place]   PREOPERATIVE DIAGNOSIS:    Nuclear sclerotic cataract right eye. H25.11   POSTOPERATIVE DIAGNOSIS:  Nuclear sclerotic cataract right eye.     PROCEDURE:  Phacoemusification with posterior chamber intraocular lens placement of the right eye   LENS:   Implant Name Type Inv. Item Serial No. Manufacturer Lot No. LRB No. Used Action  LENS IOL DIOP 16.0 - [id number] Intraocular Lens LENS IOL DIOP 16.0 [id number] AMO  Right 1 Implanted      ULTRASOUND TIME: 16 % of 1 minutes, 2 seconds.  CDE 10.0   SURGEON:  Wyonia Hough, MD   ANESTHESIA:  Topical with tetracaine drops and 2% Xylocaine jelly, augmented with 1% preservative-free intracameral lidocaine.    COMPLICATIONS:  None.   DESCRIPTION OF PROCEDURE:  The patient was identified in the holding room and transported to the operating room and placed in the supine position under the operating microscope.  The right eye was identified as the operative eye and it was prepped and draped in the usual sterile ophthalmic fashion.   A 1 millimeter clear-corneal paracentesis was made at the 12:00 position.  0.5 ml of preservative-free 1% lidocaine was injected into the anterior chamber. The anterior chamber was filled with Viscoat viscoelastic.  A 2.4 millimeter keratome was used to make a near-clear corneal incision at the 9:00 position.  A curvilinear capsulorrhexis was made with a cystotome and capsulorrhexis forceps.  Balanced salt solution was used to hydrodissect and hydrodelineate the nucleus.   Phacoemulsification was then used in stop and chop fashion to remove the lens nucleus and epinucleus.  The remaining cortex was then removed using the irrigation and aspiration handpiece. Provisc was then placed into the capsular bag to distend it for lens placement.  A lens was then injected into the capsular bag.  The remaining viscoelastic was aspirated.   Wounds were hydrated with balanced salt solution.   The anterior chamber was inflated to a physiologic pressure with balanced salt solution.  No wound leaks were noted. Cefuroxime 0.1 ml of a 10mg /ml solution was injected into the anterior chamber for a dose of 1 mg of intracameral antibiotic at the completion of the case.   Timolol and Brimonidine drops were applied to the eye.  The patient was taken to the recovery room in stable condition without complications of anesthesia or surgery.   Simren Popson 09/09/2018, 9:59 AM

## 2018-09-10 ENCOUNTER — Encounter: Payer: Self-pay | Admitting: Ophthalmology

## 2018-10-12 ENCOUNTER — Ambulatory Visit
Admission: RE | Admit: 2018-10-12 | Discharge: 2018-10-12 | Disposition: A | Discharge status: Home / Self Care | Payer: Medicare Other | Source: Ambulatory Visit | Attending: Family Medicine | Admitting: Family Medicine

## 2018-10-12 DIAGNOSIS — Z1231 Encounter for screening mammogram for malignant neoplasm of breast: Secondary | ICD-10-CM | POA: Diagnosis present

## 2018-12-18 ENCOUNTER — Other Ambulatory Visit: Payer: Self-pay

## 2018-12-18 DIAGNOSIS — Z20822 Contact with and (suspected) exposure to covid-19: Secondary | ICD-10-CM

## 2018-12-19 LAB — NOVEL CORONAVIRUS, NAA: SARS-CoV-2, NAA: NOT DETECTED

## 2018-12-22 ENCOUNTER — Telehealth: Payer: Self-pay | Admitting: General Practice

## 2018-12-22 NOTE — Telephone Encounter (Signed)
Pt called in for covid result.  Advised of negative result.

## 2019-01-28 ENCOUNTER — Ambulatory Visit: Payer: Medicare Other | Attending: Internal Medicine

## 2019-01-28 DIAGNOSIS — Z23 Encounter for immunization: Secondary | ICD-10-CM | POA: Insufficient documentation

## 2019-01-28 NOTE — Progress Notes (Signed)
   Covid-19 Vaccination Clinic  Name:  Madison Cuevas    MRN: [id number] DOB: 07-08-1945  01/28/2019  Madison Cuevas was observed post Covid-19 immunization for 15 minutes without incidence. She was provided with Vaccine Information Sheet and instruction to access the V-Safe system.   Madison Cuevas was instructed to call 911 with any severe reactions post vaccine: Marland Kitchen Difficulty breathing  . Swelling of your face and throat  . A fast heartbeat  . A bad rash all over your body  . Dizziness and weakness    Immunizations Administered    Name Date Dose VIS Date Route   Pfizer COVID-19 Vaccine 01/28/2019  5:50 PM 0.3 mL 12/18/2018 Intramuscular   Manufacturer: [employer], [employer]   Lot: [id number]   NDC: [address]-1

## 2019-02-18 ENCOUNTER — Ambulatory Visit: Payer: Medicare Other | Attending: Internal Medicine

## 2019-02-18 DIAGNOSIS — Z23 Encounter for immunization: Secondary | ICD-10-CM | POA: Insufficient documentation

## 2019-02-18 NOTE — Progress Notes (Signed)
   Covid-19 Vaccination Clinic  Name:  Madison Cuevas    MRN: [id number] DOB: [birth year]  02/18/2019  [name] was observed post Covid-19 immunization for 15 minutes without incidence. She was provided with Vaccine Information Sheet and instruction to access the V-Safe system.   [name] was instructed to call 911 with any severe reactions post vaccine: Marland Kitchen Difficulty breathing  . Swelling of your face and throat  . A fast heartbeat  . A bad rash all over your body  . Dizziness and weakness    Immunizations Administered    Name Date Dose VIS Date Route   Pfizer COVID-19 Vaccine 02/18/2019  9:12 AM 0.3 mL 12/18/2018 Intramuscular   Manufacturer: [employer], [employer]   Lot: [id number]   NDC: [address]-4

## 2019-09-08 ENCOUNTER — Other Ambulatory Visit: Payer: Self-pay | Admitting: Family Medicine

## 2019-09-08 DIAGNOSIS — Z1231 Encounter for screening mammogram for malignant neoplasm of breast: Secondary | ICD-10-CM

## 2019-10-15 ENCOUNTER — Other Ambulatory Visit: Payer: Self-pay

## 2019-10-15 ENCOUNTER — Ambulatory Visit
Admission: RE | Admit: 2019-10-15 | Discharge: 2019-10-15 | Disposition: A | Discharge status: Home / Self Care | Payer: Medicare PPO | Source: Ambulatory Visit | Attending: Family Medicine | Admitting: Family Medicine

## 2019-10-15 DIAGNOSIS — Z1231 Encounter for screening mammogram for malignant neoplasm of breast: Secondary | ICD-10-CM | POA: Insufficient documentation

## 2019-10-31 DIAGNOSIS — M1712 Unilateral primary osteoarthritis, left knee: Principal | ICD-10-CM | POA: Insufficient documentation

## 2020-05-23 ENCOUNTER — Emergency Department: Payer: Medicare PPO

## 2020-05-23 ENCOUNTER — Emergency Department
Admission: EM | Admit: 2020-05-23 | Discharge: 2020-05-23 | Disposition: A | Discharge status: Home / Self Care | Payer: Medicare PPO | Attending: Emergency Medicine | Admitting: Emergency Medicine

## 2020-05-23 ENCOUNTER — Other Ambulatory Visit: Payer: Self-pay

## 2020-05-23 DIAGNOSIS — R1010 Upper abdominal pain, unspecified: Secondary | ICD-10-CM | POA: Diagnosis not present

## 2020-05-23 DIAGNOSIS — E119 Type 2 diabetes mellitus without complications: Secondary | ICD-10-CM | POA: Diagnosis not present

## 2020-05-23 DIAGNOSIS — R112 Nausea with vomiting, unspecified: Secondary | ICD-10-CM | POA: Diagnosis not present

## 2020-05-23 DIAGNOSIS — Z87891 Personal history of nicotine dependence: Secondary | ICD-10-CM | POA: Diagnosis not present

## 2020-05-23 DIAGNOSIS — Z79899 Other long term (current) drug therapy: Secondary | ICD-10-CM | POA: Insufficient documentation

## 2020-05-23 DIAGNOSIS — Z7984 Long term (current) use of oral hypoglycemic drugs: Secondary | ICD-10-CM | POA: Diagnosis not present

## 2020-05-23 DIAGNOSIS — R197 Diarrhea, unspecified: Secondary | ICD-10-CM | POA: Diagnosis not present

## 2020-05-23 DIAGNOSIS — Z96651 Presence of right artificial knee joint: Secondary | ICD-10-CM | POA: Insufficient documentation

## 2020-05-23 DIAGNOSIS — I10 Essential (primary) hypertension: Secondary | ICD-10-CM | POA: Diagnosis not present

## 2020-05-23 LAB — COMPREHENSIVE METABOLIC PANEL
ALT: 10 U/L (ref 0–44)
AST: 20 U/L (ref 15–41)
Albumin: 3.6 g/dL (ref 3.5–5.0)
Alkaline Phosphatase: 42 U/L (ref 38–126)
Anion gap: 10 (ref 5–15)
BUN: 15 mg/dL (ref 8–23)
CO2: 25 mmol/L (ref 22–32)
Calcium: 9 mg/dL (ref 8.9–10.3)
Chloride: 102 mmol/L (ref 98–111)
Creatinine, Ser: 1.27 mg/dL — ABNORMAL HIGH (ref 0.44–1.00)
GFR, Estimated: 44 mL/min — ABNORMAL LOW (ref >60)
Glucose, Bld: 303 mg/dL — ABNORMAL HIGH (ref 70–99)
Potassium: 4 mmol/L (ref 3.5–5.1)
Sodium: 137 mmol/L (ref 135–145)
Total Bilirubin: 0.8 mg/dL (ref 0.3–1.2)
Total Protein: 6.6 g/dL (ref 6.5–8.1)

## 2020-05-23 LAB — URINALYSIS, COMPLETE (UACMP) WITH MICROSCOPIC
Bacteria, UA: NONE SEEN
Bilirubin Urine: NEGATIVE
Glucose, UA: >=500 mg/dL — AB
Hgb urine dipstick: NEGATIVE
Ketones, ur: NEGATIVE mg/dL
Nitrite: NEGATIVE
Protein, ur: NEGATIVE mg/dL
Specific Gravity, Urine: 1.01 (ref 1.005–1.030)
pH: 5 (ref 5.0–8.0)

## 2020-05-23 LAB — CBC
HCT: 45.7 % (ref 36.0–46.0)
Hemoglobin: 15.5 g/dL — ABNORMAL HIGH (ref 12.0–15.0)
MCH: 30.4 pg (ref 26.0–34.0)
MCHC: 33.9 g/dL (ref 30.0–36.0)
MCV: 89.6 fL (ref 80.0–100.0)
Platelets: 253 10*3/uL (ref 150–400)
RBC: 5.1 MIL/uL (ref 3.87–5.11)
RDW: 13.2 % (ref 11.5–15.5)
WBC: 7.8 10*3/uL (ref 4.0–10.5)
nRBC: 0 % (ref 0.0–0.2)

## 2020-05-23 LAB — LIPASE, BLOOD: Lipase: 24 U/L (ref 11–51)

## 2020-05-23 MED ORDER — SODIUM CHLORIDE 0.9 % IV BOLUS
1000.0000 mL | Freq: Once | INTRAVENOUS | Status: AC
  Administered 2020-05-23: 1000 mL via INTRAVENOUS

## 2020-05-23 MED ORDER — PANTOPRAZOLE SODIUM 40 MG PO TBEC: 40.0000 mg | DELAYED_RELEASE_TABLET | Freq: Every day | ORAL | 1 refills | Status: AC

## 2020-05-23 MED ORDER — ONDANSETRON HCL 4 MG/2ML IJ SOLN
4.0000 mg | Freq: Once | INTRAMUSCULAR | Status: AC
  Administered 2020-05-23: 4 mg via INTRAVENOUS
  Filled 2020-05-23: qty 2

## 2020-05-23 MED ORDER — ONDANSETRON 4 MG PO TBDP: 4.0000 mg | ORAL_TABLET | Freq: Three times a day (TID) | ORAL | 0 refills | Status: DC | PRN

## 2020-05-23 MED ORDER — KETOROLAC TROMETHAMINE 30 MG/ML IJ SOLN
15.0000 mg | Freq: Once | INTRAMUSCULAR | Status: AC
  Administered 2020-05-23: 15 mg via INTRAVENOUS
  Filled 2020-05-23: qty 1

## 2020-05-23 NOTE — Discharge Instructions (Signed)
Please take your medications as prescribed.  Please call the number provided for GI medicine to arrange a follow-up appointment.

## 2020-05-23 NOTE — ED Provider Notes (Signed)
[facility]  Time seen: 12:28 PM  I have reviewed the triage vital signs and the nursing notes.   HISTORY  Chief Complaint Abdominal Pain   HPI Madison Cuevas is a 75 y.o. female with a past medical history of diabetes, gastric reflux, hypertension, hyperlipidemia who presents to the emergency department for upper abdominal discomfort nausea vomiting diarrhea.  According to the patient over the past 2 days she has been nauseated frequent episodes of vomiting and diarrhea.  States she has not had much of an appetite due to the nausea.  Patient states mild discomfort across the upper abdomen.  States this is the third episode since March of similar symptoms.  She was told she had diminished function in her gallbladder but it was still functioning.  Denies any fever.   Past Medical History:  Diagnosis Date  . Diabetes mellitus without complication (HCC)   . GERD (gastroesophageal reflux disease)   . Herpes zoster   . History of chicken pox   . Hyperlipidemia   . Hypertension    pt denies.  meds are for kidney protection  . Osteoarthrosis   . Wears dentures    lower partial    There are no problems to display for this patient.   Past Surgical History:  Procedure Laterality Date  . APPENDECTOMY    . BACK SURGERY    . CATARACT EXTRACTION W/PHACO Right 09/09/2018   Procedure: CATARACT EXTRACTION PHACO AND INTRAOCULAR LENS PLACEMENT (IOC)  RIGHT DIABETIC  01:01.5  16.2%  9.99;  Surgeon: Lockie Mola, MD;  [facility];  Service: Ophthalmology;  Laterality: Right;  Diabetic - oral meds  . COLONOSCOPY WITH PROPOFOL N/A 08/21/2015   Procedure: COLONOSCOPY WITH PROPOFOL;  Surgeon: Christena Deem, MD;  Location: [facility] ENDOSCOPY;  Service: Endoscopy;  Laterality: N/A;  . JOINT REPLACEMENT Right    knee  . TONSILLECTOMY    . TUBAL LIGATION      Prior to Admission medications   Medication Sig  Start Date End Date Taking? Authorizing Provider  Calcium-Vitamin D (CALTRATE 600 PLUS-VIT D PO) Take 1 tablet by mouth 2 (two) times daily.    [provider]  CEPHALEXIN PO Take by mouth.    [provider]  glipiZIDE (GLUCOTROL XL) 5 MG 24 hr tablet Take 5 mg by mouth daily with breakfast.    [provider]  losartan (COZAAR) 50 MG tablet Take 50 mg by mouth daily.    [provider]  metFORMIN (GLUCOPHAGE) 500 MG tablet Take by mouth 2 (two) times daily with a meal.    [provider]  montelukast (SINGULAIR) 10 MG tablet Take 10 mg by mouth daily as needed.    [provider]  MULTIPLE VITAMIN PO Take 1 tablet by mouth daily.    [provider]  MUPIROCIN EX Apply topically.    [provider]  pantoprazole (PROTONIX) 40 MG tablet Take 40 mg by mouth 2 (two) times daily as needed.     [provider]  progesterone (PROMETRIUM) 100 MG capsule Take 100 mg by mouth daily.    [provider]  psyllium (METAMUCIL) 58.6 % packet Take 1 packet by mouth daily.    [provider]  simvastatin (ZOCOR) 20 MG tablet Take 20 mg by mouth daily.    [provider]  sitaGLIPtin (JANUVIA) 100 MG tablet Take 100 mg by mouth daily.    [provider]  tolterodine (DETROL LA) 4 MG 24 hr capsule Take 2 mg by mouth 2 (two) times daily.     [provider]  venlafaxine (EFFEXOR) 75 MG tablet Take 75 mg by mouth daily.    [provider]    Allergies  Allergen Reactions  . Adhesive [Tape] Rash    With extended use    Family History  Problem Relation Age of Onset  . Breast cancer Cousin     Social History Social History   Tobacco Use  . Smoking status: Former Smoker    Quit date: 1998    Years since quitting: 24.3  . Smokeless tobacco: Never Used  Vaping Use  . Vaping Use: Never used  Substance Use Topics  . Alcohol use: Yes    Comment: may have 1-2 drinks/month   . Drug use: No    Review of Systems Constitutional: Negative for fever. Cardiovascular: Negative for chest pain. Respiratory: Negative for shortness of breath. Gastrointestinal: Mild upper abdominal discomfort.  Positive for nausea vomiting and diarrhea. Genitourinary: Negative for urinary compaints Musculoskeletal: Negative for musculoskeletal complaints Neurological: Negative for headache All other ROS negative  ____________________________________________   PHYSICAL EXAM:  VITAL SIGNS: ED Triage Vitals  Enc Vitals Group     BP 05/23/20 1223 (!) 106/56     Pulse Rate 05/23/20 1223 (!) 109     Resp 05/23/20 1223 17     Temp 05/23/20 1223 97.6 F (36.4 C)     Temp Source 05/23/20 1223 Oral     SpO2 05/23/20 1223 93 %     Weight 05/23/20 1220 196 lb (88.9 kg)     Height 05/23/20 1220 5\' 7"  (1.702 m)     Head Circumference --      Peak Flow --      Pain Score 05/23/20 1220 4     Pain Loc --      Pain Edu? --      Excl. in GC? --     Constitutional: Alert and oriented. Well appearing and in no distress. Eyes: Normal exam ENT      Head: Normocephalic and atraumatic.      Mouth/Throat: Mucous membranes are moist. Cardiovascular: Normal rate, regular rhythm.  Respiratory: Normal respiratory effort without tachypnea nor retractions. Breath sounds are clear Gastrointestinal: Soft, mild epigastric discomfort.  No rebound guarding or distention. Musculoskeletal: Nontender with normal range of motion in all extremities.  Neurologic:  Normal speech and language. No gross focal neurologic deficits  Skin:  Skin is warm, dry and intact.  Psychiatric: Mood and affect are normal.   ____________________________________________   RADIOLOGY  CT shows no acute abnormality.  ____________________________________________   INITIAL IMPRESSION / ASSESSMENT AND PLAN / ED COURSE  Pertinent labs & imaging results that were available during my care of the patient were reviewed by  me and considered in my medical decision making (see chart for details).   Patient presents emergency department for upper abdominal discomfort nausea vomiting and diarrhea.  Overall the patient appears well, benign abdominal exam besides very slight epigastric discomfort.  We will check labs, IV hydrate, treat with nausea medication and reassess.  Patient agreeable to plan of care.  Patient's lab work is largely nonrevealing.  Reassuring lipase LFTs elevation.  Given the patient's recurrent nature of this pain we will proceed with an abdominal CT to further evaluate.  If CT is negative for acute abnormality anticipate likely discharge home with GI medicine follow-up.  Patient agreeable to plan of  care.  Patient's work-up is essentially negative including lab work and a CT scan of the abdomen.  Urinalysis is pending, we will have the patient follow-up with GI medicine as long as urinalysis appears well.  Patient care signed out to oncoming provider.  Madison Cuevas was evaluated in Emergency Department on 05/23/2020 for the symptoms described in the history of present illness. She was evaluated in the context of the global COVID-19 pandemic, which necessitated consideration that the patient might be at risk for infection with the SARS-CoV-2 virus that causes COVID-19. Institutional protocols and algorithms that pertain to the evaluation of patients at risk for COVID-19 are in a state of rapid change based on information released by regulatory bodies including the CDC and federal and state organizations. These policies and algorithms were followed during the patient's care in the ED.  ____________________________________________   FINAL CLINICAL IMPRESSION(S) / ED DIAGNOSES  Nausea vomiting diarrhea Upper abdominal discomfort   Minna Antis, MD 05/23/20 1525

## 2020-05-23 NOTE — ED Triage Notes (Signed)
Pt c/o upper abd pain with N/V/D since Sunday , states she has had 2 other episodes of same sx since march. Pt states she does have black stools but believes it is related to take pepto

## 2020-05-23 NOTE — ED Notes (Signed)
follow up gastro info provided rx for nausea and stomach all questions answered

## 2020-05-23 NOTE — ED Provider Notes (Signed)
Procedures     ----------------------------------------- 5:26 PM on 05/23/2020 ----------------------------------------- Vitals remain normal.  Urinalysis normal.  Work-up reassuring, CT scan unremarkable, stable for discharge home to follow-up with PCP and GI.     Sharman Cheek, MD 05/23/20 1726

## 2020-06-07 DIAGNOSIS — A4150 Gram-negative sepsis, unspecified: Secondary | ICD-10-CM

## 2020-06-07 HISTORY — DX: Gram-negative sepsis, unspecified: A41.50

## 2020-06-20 ENCOUNTER — Encounter: Payer: Self-pay | Admitting: Internal Medicine

## 2020-06-21 ENCOUNTER — Encounter: Payer: Self-pay | Admitting: Internal Medicine

## 2020-06-21 ENCOUNTER — Other Ambulatory Visit: Payer: Self-pay

## 2020-06-21 ENCOUNTER — Ambulatory Visit: Payer: Medicare PPO | Admitting: Anesthesiology

## 2020-06-21 ENCOUNTER — Encounter
Admission: RE | Disposition: A | Discharge status: Home / Self Care | Payer: Self-pay | Source: Home / Self Care | Attending: Internal Medicine

## 2020-06-21 ENCOUNTER — Ambulatory Visit
Admission: RE | Admit: 2020-06-21 | Discharge: 2020-06-21 | Disposition: A | Discharge status: Home / Self Care | Payer: Medicare PPO | Attending: Internal Medicine | Admitting: Internal Medicine

## 2020-06-21 DIAGNOSIS — Z8711 Personal history of peptic ulcer disease: Secondary | ICD-10-CM | POA: Insufficient documentation

## 2020-06-21 DIAGNOSIS — R1013 Epigastric pain: Secondary | ICD-10-CM | POA: Diagnosis not present

## 2020-06-21 DIAGNOSIS — Z7984 Long term (current) use of oral hypoglycemic drugs: Secondary | ICD-10-CM | POA: Insufficient documentation

## 2020-06-21 DIAGNOSIS — R112 Nausea with vomiting, unspecified: Secondary | ICD-10-CM | POA: Diagnosis not present

## 2020-06-21 DIAGNOSIS — K219 Gastro-esophageal reflux disease without esophagitis: Secondary | ICD-10-CM | POA: Diagnosis not present

## 2020-06-21 DIAGNOSIS — Z79899 Other long term (current) drug therapy: Secondary | ICD-10-CM | POA: Insufficient documentation

## 2020-06-21 DIAGNOSIS — K2970 Gastritis, unspecified, without bleeding: Secondary | ICD-10-CM | POA: Diagnosis not present

## 2020-06-21 DIAGNOSIS — Z91048 Other nonmedicinal substance allergy status: Secondary | ICD-10-CM | POA: Diagnosis not present

## 2020-06-21 HISTORY — PX: ESOPHAGOGASTRODUODENOSCOPY (EGD) WITH PROPOFOL: SHX5813

## 2020-06-21 SURGERY — ESOPHAGOGASTRODUODENOSCOPY (EGD) WITH PROPOFOL
Anesthesia: General

## 2020-06-21 MED ORDER — PROPOFOL 500 MG/50ML IV EMUL
INTRAVENOUS | Status: AC
  Filled 2020-06-21: qty 50

## 2020-06-21 MED ORDER — SODIUM CHLORIDE 0.9 % IV SOLN: INTRAVENOUS | Status: DC

## 2020-06-21 MED ORDER — PROPOFOL 10 MG/ML IV BOLUS
INTRAVENOUS | Status: DC | PRN
  Administered 2020-06-21: 80 mg via INTRAVENOUS

## 2020-06-21 MED ORDER — PROPOFOL 500 MG/50ML IV EMUL
INTRAVENOUS | Status: DC | PRN
  Administered 2020-06-21: 155 ug/kg/min via INTRAVENOUS

## 2020-06-21 NOTE — H&P (Signed)
Outpatient short stay form Pre-procedure 06/21/2020 2:09 PM Fernand Sorbello K. Norma Fredrickson, M.D.  Primary Physician: Kandyce Rud, M.D.  Reason for visit:  Epigastric pain, nausea and vomiting, GERD  History of present illness:  75 y/o female with hx of biliary dyskinesia and remote gastric ulcer presents with recurrent epigastric pain, nausea. CT hx showed no evidence of cholelithiasis. Bloodwork showed no evidence of anemia.    Current Facility-Administered Medications:    0.9 %  sodium chloride infusion, , Intravenous, Continuous, Kaniya Trueheart, Boykin Nearing, MD  Medications Prior to Admission  Medication Sig Dispense Refill Last Dose   Calcium-Vitamin D (CALTRATE 600 PLUS-VIT D PO) Take 1 tablet by mouth 2 (two) times daily.   06/20/2020   glipiZIDE (GLUCOTROL XL) 5 MG 24 hr tablet Take 5 mg by mouth daily with breakfast.   06/20/2020   losartan (COZAAR) 50 MG tablet Take 50 mg by mouth daily.   06/20/2020   metFORMIN (GLUCOPHAGE) 500 MG tablet Take by mouth 2 (two) times daily with a meal.   06/20/2020   montelukast (SINGULAIR) 10 MG tablet Take 10 mg by mouth daily as needed.   06/20/2020   MULTIPLE VITAMIN PO Take 1 tablet by mouth daily.   06/20/2020   ondansetron (ZOFRAN ODT) 4 MG disintegrating tablet Take 1 tablet (4 mg total) by mouth every 8 (eight) hours as needed for nausea or vomiting. 20 tablet 0 Past Week   pantoprazole (PROTONIX) 40 MG tablet Take 1 tablet (40 mg total) by mouth daily. 30 tablet 1 06/20/2020   progesterone (PROMETRIUM) 100 MG capsule Take 100 mg by mouth daily.   06/20/2020   psyllium (METAMUCIL) 58.6 % packet Take 1 packet by mouth daily.   06/20/2020   simvastatin (ZOCOR) 20 MG tablet Take 20 mg by mouth daily.   06/20/2020   sitaGLIPtin (JANUVIA) 100 MG tablet Take 100 mg by mouth daily.   06/20/2020   tolterodine (DETROL LA) 4 MG 24 hr capsule Take 2 mg by mouth 2 (two) times daily.    06/20/2020   venlafaxine (EFFEXOR) 75 MG tablet Take 75 mg by mouth daily.   06/20/2020    albuterol (VENTOLIN HFA) 108 (90 Base) MCG/ACT inhaler Inhale 1-2 puffs into the lungs every 6 (six) hours as needed for wheezing or shortness of breath. (Patient not taking: Reported on 06/21/2020)   Not Taking   azelastine (OPTIVAR) 0.05 % ophthalmic solution 1 drop 2 (two) times daily. (Patient not taking: Reported on 06/21/2020)   Not Taking   CEPHALEXIN PO Take by mouth. (Patient not taking: Reported on 06/21/2020)   Not Taking   MUPIROCIN EX Apply topically.        Allergies  Allergen Reactions   Adhesive [Tape] Rash    With extended use     Past Medical History:  Diagnosis Date   Diabetes mellitus without complication (HCC)    GERD (gastroesophageal reflux disease)    Herpes zoster    History of chicken pox    Hyperlipidemia    Hypertension    pt denies.  meds are for kidney protection   Osteoarthrosis    Wears dentures    lower partial    Review of systems:  Otherwise negative.    Physical Exam  Gen: Alert, oriented. Appears stated age.  HEENT: [place]/AT. PERRLA. Lungs: CTA, no wheezes. CV: RR nl S1, S2. Abd: soft, benign, no masses. BS+ Ext: No edema. Pulses 2+    Planned procedures: Proceed with EGD. The patient understands the nature of  the planned procedure, indications, risks, alternatives and potential complications including but not limited to bleeding, infection, perforation, damage to internal organs and possible oversedation/side effects from anesthesia. The patient agrees and gives consent to proceed.  Please refer to procedure notes for findings, recommendations and patient disposition/instructions.     Jaidan Stachnik K. Norma Fredrickson, M.D. Gastroenterology 06/21/2020  2:09 PM

## 2020-06-21 NOTE — Op Note (Signed)
[facility] Gastroenterology Patient Name: Madison Cuevas Procedure Date: 06/21/2020 2:11 PM MRN: [id number] Account #: [id number] Date of Birth: [birth year] Admit Type: Outpatient Age: 75 Room: [facility] ENDO ROOM 2 Gender: Female Note Status: Finalized Procedure:             Upper GI endoscopy Indications:           Epigastric abdominal pain, Gastro-esophageal reflux                         disease, Nausea with vomiting Providers:             Boykin Nearing. Adylynn Hertenstein MD, MD Medicines:             Propofol per Anesthesia Complications:         No immediate complications. Procedure:             Pre-Anesthesia Assessment:                        - The risks and benefits of the procedure and the                         sedation options and risks were discussed with the                         patient. All questions were answered and informed                         consent was obtained.                        - Patient identification and proposed procedure were                         verified prior to the procedure by the nurse. The                         procedure was verified in the procedure room.                        - ASA Grade Assessment: III - A patient with severe                         systemic disease.                        - After reviewing the risks and benefits, the patient                         was deemed in satisfactory condition to undergo the                         procedure.                        After obtaining informed consent, the endoscope was                         passed under direct vision. Throughout the procedure,  the patient's blood pressure, pulse, and oxygen                         saturations were monitored continuously. The Endoscope                         was introduced through the mouth, and advanced to the                         third part of duodenum. The upper GI endoscopy was                          accomplished without difficulty. The patient tolerated                         the procedure well. Findings:      The esophagus was normal.      Diffuse moderate inflammation characterized by congestion (edema),       erosions and erythema was found in the entire examined stomach. Biopsies       were taken with a cold forceps for Helicobacter pylori testing.      The examined duodenum was normal.      The exam was otherwise without abnormality. Impression:            - Normal esophagus.                        - Gastritis. Biopsied.                        - Normal examined duodenum.                        - The examination was otherwise normal. Recommendation:        - Patient has a contact number available for                         emergencies. The signs and symptoms of potential                         delayed complications were discussed with the patient.                         Return to normal activities tomorrow. Written                         discharge instructions were provided to the patient.                        - Resume previous diet.                        - Continue present medications.                        - Await pathology results.                        - Return to GI office in 3 months.                        -  The findings and recommendations were discussed with                         the patient. Procedure Code(s):     --- Professional ---                        [phone], Esophagogastroduodenoscopy, flexible,                         transoral; with biopsy, single or multiple Diagnosis Code(s):     --- Professional ---                        R11.2, Nausea with vomiting, unspecified                        K21.9, Gastro-esophageal reflux disease without                         esophagitis                        R10.13, Epigastric pain                        K29.70, Gastritis, unspecified, without bleeding CPT copyright 2019 American Medical Association. All rights  reserved. The codes documented in this report are preliminary and upon coder review may  be revised to meet current compliance requirements. Stanton Kidney MD, MD 06/21/2020 2:26:01 PM This report has been signed electronically. Number of Addenda: 0 Note Initiated On: 06/21/2020 2:11 PM Estimated Blood Loss:  Estimated blood loss: none.      [facility]

## 2020-06-21 NOTE — Anesthesia Preprocedure Evaluation (Signed)
Anesthesia Evaluation  Patient identified by MRN, date of birth, ID band Patient awake    Reviewed: Allergy & Precautions, NPO status , Patient's Chart, lab work & pertinent test results  History of Anesthesia Complications Negative for: history of anesthetic complications  Airway Mallampati: I  TM Distance: >3 FB Neck ROM: Full    Dental no notable dental hx.    Pulmonary neg pulmonary ROS, former smoker,    Pulmonary exam normal        Cardiovascular hypertension, Pt. on medications Normal cardiovascular exam     Neuro/Psych  Neuromuscular disease    GI/Hepatic GERD  Medicated,  Endo/Other  diabetes, Well Controlled, Type 2, Oral Hypoglycemic Agents  Renal/GU      Musculoskeletal  (+) Arthritis , Osteoarthritis,    Abdominal   Peds  Hematology negative hematology ROS (+)   Anesthesia Other Findings . Diabetic peripheral neuropathy (CMS-HCC)  . Diverticulosis 08/21/2015  . Herpes zoster  . History of chickenpox  . Hypertension  . Osteoarthrosis, unspecified whether generalized or localized, lower leg 05/28/2013  . Other and unspecified hyperlipidemia 05/28/2013  . Type II or unspecified type diabetes mellitus without mention of complication, uncontrolled 05/28/2013   Reproductive/Obstetrics                             Anesthesia Physical  Anesthesia Plan  ASA: 2  Anesthesia Plan: General   Post-op Pain Management:    Induction: Intravenous  PONV Risk Score and Plan: 2 and TIVA and Propofol infusion  Airway Management Planned: Nasal Cannula and Natural Airway  Additional Equipment:   Intra-op Plan:   Post-operative Plan:   Informed Consent: I have reviewed the patients History and Physical, chart, labs and discussed the procedure including the risks, benefits and alternatives for the proposed anesthesia with the patient or authorized representative who has indicated  his/her understanding and acceptance.       Plan Discussed with: CRNA, Anesthesiologist and Surgeon  Anesthesia Plan Comments:         Anesthesia Quick Evaluation

## 2020-06-21 NOTE — Interval H&P Note (Signed)
History and Physical Interval Note:  06/21/2020 2:10 PM  Madison Cuevas  has presented today for surgery, with the diagnosis of UPPER ABDOMEN PAIN GERD N&V LONG TERM NSAID USE.  The various methods of treatment have been discussed with the patient and family. After consideration of risks, benefits and other options for treatment, the patient has consented to  Procedure(s): ESOPHAGOGASTRODUODENOSCOPY (EGD) WITH PROPOFOL (N/A) as a surgical intervention.  The patient's history has been reviewed, patient examined, no change in status, stable for surgery.  I have reviewed the patient's chart and labs.  Questions were answered to the patient's satisfaction.     [place], [place]

## 2020-06-21 NOTE — Anesthesia Postprocedure Evaluation (Signed)
Anesthesia Post Note  Patient: WINOLA DRUM  Procedure(s) Performed: ESOPHAGOGASTRODUODENOSCOPY (EGD) WITH PROPOFOL  Patient location during evaluation: Phase II Anesthesia Type: General Level of consciousness: awake and alert, awake and oriented Pain management: pain level controlled Vital Signs Assessment: post-procedure vital signs reviewed and stable Respiratory status: spontaneous breathing, nonlabored ventilation and respiratory function stable Cardiovascular status: blood pressure returned to baseline and stable Postop Assessment: no apparent nausea or vomiting Anesthetic complications: no   No notable events documented.   Last Vitals:  Vitals:   06/21/20 1353  BP: 109/68  Pulse: (!) 106  Resp: 18  Temp: 36.5 C  SpO2: 95%    Last Pain:  Vitals:   06/21/20 1437  TempSrc:   PainSc: 0-No pain                 Manfred Arch

## 2020-06-21 NOTE — Transfer of Care (Signed)
Immediate Anesthesia Transfer of Care Note  Patient: Madison Cuevas  Procedure(s) Performed: ESOPHAGOGASTRODUODENOSCOPY (EGD) WITH PROPOFOL  Patient Location: PACU  Anesthesia Type:General  Level of Consciousness: awake and alert   Airway & Oxygen Therapy: Patient Spontanous Breathing and Patient connected to nasal cannula oxygen  Post-op Assessment: Report given to RN and Post -op Vital signs reviewed and stable  Post vital signs: Reviewed and stable  Last Vitals:  Vitals Value Taken Time  BP    Temp    Pulse 94 06/21/20 1427  Resp 20 06/21/20 1427  SpO2 92 % 06/21/20 1427  Vitals shown include unvalidated device data.  Last Pain:  Vitals:   06/21/20 1353  TempSrc: Temporal  PainSc: 0-No pain         Complications: No notable events documented.

## 2020-06-22 ENCOUNTER — Encounter: Payer: Self-pay | Admitting: Internal Medicine

## 2020-06-23 LAB — SURGICAL PATHOLOGY

## 2020-06-26 ENCOUNTER — Inpatient Hospital Stay
Admission: EM | Admit: 2020-06-26 | Discharge: 2020-07-01 | DRG: 871 | Disposition: A | Discharge status: Home / Self Care | Payer: Medicare PPO | Attending: Internal Medicine | Admitting: Internal Medicine

## 2020-06-26 ENCOUNTER — Emergency Department: Payer: Medicare PPO

## 2020-06-26 DIAGNOSIS — Z79899 Other long term (current) drug therapy: Secondary | ICD-10-CM

## 2020-06-26 DIAGNOSIS — Z87891 Personal history of nicotine dependence: Secondary | ICD-10-CM

## 2020-06-26 DIAGNOSIS — J189 Pneumonia, unspecified organism: Secondary | ICD-10-CM | POA: Diagnosis not present

## 2020-06-26 DIAGNOSIS — E785 Hyperlipidemia, unspecified: Secondary | ICD-10-CM | POA: Diagnosis present

## 2020-06-26 DIAGNOSIS — I2510 Atherosclerotic heart disease of native coronary artery without angina pectoris: Secondary | ICD-10-CM | POA: Diagnosis present

## 2020-06-26 DIAGNOSIS — Z794 Long term (current) use of insulin: Secondary | ICD-10-CM

## 2020-06-26 DIAGNOSIS — I82432 Acute embolism and thrombosis of left popliteal vein: Secondary | ICD-10-CM | POA: Diagnosis present

## 2020-06-26 DIAGNOSIS — A280 Pasteurellosis: Secondary | ICD-10-CM | POA: Diagnosis present

## 2020-06-26 DIAGNOSIS — I10 Essential (primary) hypertension: Secondary | ICD-10-CM | POA: Diagnosis present

## 2020-06-26 DIAGNOSIS — R350 Frequency of micturition: Secondary | ICD-10-CM | POA: Diagnosis present

## 2020-06-26 DIAGNOSIS — R6520 Severe sepsis without septic shock: Secondary | ICD-10-CM | POA: Diagnosis present

## 2020-06-26 DIAGNOSIS — Z6831 Body mass index (BMI) 31.0-31.9, adult: Secondary | ICD-10-CM

## 2020-06-26 DIAGNOSIS — E1165 Type 2 diabetes mellitus with hyperglycemia: Secondary | ICD-10-CM | POA: Diagnosis present

## 2020-06-26 DIAGNOSIS — Z7984 Long term (current) use of oral hypoglycemic drugs: Secondary | ICD-10-CM

## 2020-06-26 DIAGNOSIS — M79606 Pain in leg, unspecified: Secondary | ICD-10-CM

## 2020-06-26 DIAGNOSIS — K219 Gastro-esophageal reflux disease without esophagitis: Secondary | ICD-10-CM | POA: Diagnosis present

## 2020-06-26 DIAGNOSIS — A4159 Other Gram-negative sepsis: Principal | ICD-10-CM | POA: Diagnosis present

## 2020-06-26 DIAGNOSIS — G9341 Metabolic encephalopathy: Secondary | ICD-10-CM | POA: Diagnosis present

## 2020-06-26 DIAGNOSIS — K2960 Other gastritis without bleeding: Secondary | ICD-10-CM | POA: Diagnosis present

## 2020-06-26 DIAGNOSIS — I2699 Other pulmonary embolism without acute cor pulmonale: Secondary | ICD-10-CM | POA: Diagnosis present

## 2020-06-26 DIAGNOSIS — E876 Hypokalemia: Secondary | ICD-10-CM | POA: Diagnosis present

## 2020-06-26 DIAGNOSIS — F32A Depression, unspecified: Secondary | ICD-10-CM | POA: Diagnosis present

## 2020-06-26 DIAGNOSIS — Z20822 Contact with and (suspected) exposure to covid-19: Secondary | ICD-10-CM | POA: Diagnosis present

## 2020-06-26 DIAGNOSIS — Z803 Family history of malignant neoplasm of breast: Secondary | ICD-10-CM

## 2020-06-26 DIAGNOSIS — E669 Obesity, unspecified: Secondary | ICD-10-CM | POA: Diagnosis present

## 2020-06-26 DIAGNOSIS — R7881 Bacteremia: Secondary | ICD-10-CM | POA: Diagnosis present

## 2020-06-26 DIAGNOSIS — N3281 Overactive bladder: Secondary | ICD-10-CM | POA: Diagnosis present

## 2020-06-26 DIAGNOSIS — A419 Sepsis, unspecified organism: Secondary | ICD-10-CM | POA: Diagnosis present

## 2020-06-26 DIAGNOSIS — K2900 Acute gastritis without bleeding: Secondary | ICD-10-CM | POA: Diagnosis present

## 2020-06-26 HISTORY — DX: Pneumonia, unspecified organism: J18.9

## 2020-06-26 LAB — CBC WITH DIFFERENTIAL/PLATELET
Abs Immature Granulocytes: 0.07 10*3/uL (ref 0.00–0.07)
Basophils Absolute: 0 10*3/uL (ref 0.0–0.1)
Basophils Relative: 0 %
Eosinophils Absolute: 0.1 10*3/uL (ref 0.0–0.5)
Eosinophils Relative: 1 %
HCT: 41.3 % (ref 36.0–46.0)
Hemoglobin: 14.2 g/dL (ref 12.0–15.0)
Immature Granulocytes: 1 %
Lymphocytes Relative: 8 %
Lymphs Abs: 1.1 10*3/uL (ref 0.7–4.0)
MCH: 30.3 pg (ref 26.0–34.0)
MCHC: 34.4 g/dL (ref 30.0–36.0)
MCV: 88.1 fL (ref 80.0–100.0)
Monocytes Absolute: 0.5 10*3/uL (ref 0.1–1.0)
Monocytes Relative: 4 %
Neutro Abs: 11.1 10*3/uL — ABNORMAL HIGH (ref 1.7–7.7)
Neutrophils Relative %: 86 %
Platelets: 191 10*3/uL (ref 150–400)
RBC: 4.69 MIL/uL (ref 3.87–5.11)
RDW: 12.2 % (ref 11.5–15.5)
WBC: 12.9 10*3/uL — ABNORMAL HIGH (ref 4.0–10.5)
nRBC: 0 % (ref 0.0–0.2)

## 2020-06-26 LAB — COMPREHENSIVE METABOLIC PANEL
ALT: 13 U/L (ref 0–44)
AST: 22 U/L (ref 15–41)
Albumin: 4.3 g/dL (ref 3.5–5.0)
Alkaline Phosphatase: 46 U/L (ref 38–126)
Anion gap: 9 (ref 5–15)
BUN: 18 mg/dL (ref 8–23)
CO2: 23 mmol/L (ref 22–32)
Calcium: 9.1 mg/dL (ref 8.9–10.3)
Chloride: 103 mmol/L (ref 98–111)
Creatinine, Ser: 0.89 mg/dL (ref 0.44–1.00)
GFR, Estimated: >60 mL/min (ref >60)
Glucose, Bld: 258 mg/dL — ABNORMAL HIGH (ref 70–99)
Potassium: 4.1 mmol/L (ref 3.5–5.1)
Sodium: 135 mmol/L (ref 135–145)
Total Bilirubin: 0.6 mg/dL (ref 0.3–1.2)
Total Protein: 7.4 g/dL (ref 6.5–8.1)

## 2020-06-26 LAB — LACTIC ACID, PLASMA: Lactic Acid, Venous: 2.5 mmol/L (ref 0.5–1.9)

## 2020-06-26 LAB — PROTIME-INR
INR: 1 (ref 0.8–1.2)
Prothrombin Time: 12.9 seconds (ref 11.4–15.2)

## 2020-06-26 MED ORDER — ACETAMINOPHEN 500 MG PO TABS
1000.0000 mg | ORAL_TABLET | Freq: Once | ORAL | Status: AC
  Administered 2020-06-26: 1000 mg via ORAL
  Filled 2020-06-26: qty 2

## 2020-06-26 MED ORDER — LACTATED RINGERS IV BOLUS
30.0000 mL/kg | Freq: Once | INTRAVENOUS | Status: AC
  Administered 2020-06-26: 2187 mL via INTRAVENOUS

## 2020-06-26 NOTE — ED Provider Notes (Signed)
[facility] Emergency Department Provider Note  ____________________________________________  Time seen: Approximately 11:32 PM  I have reviewed the triage vital signs and the nursing notes.   HISTORY  Chief Complaint Code Sepsis and Cough  Level 5 caveat:  Portions of the history and physical were unable to be obtained due to AMS   HPI Madison Cuevas is a 75 y.o. female with a history of diabetes, GERD, herpes zoster, hypertension, hyperlipidemia who presents from home for evaluation of fever.  According to patient's husband she has had a productive cough for the last several days.  Has had progressively worsening confusion and fever.  No vomiting or diarrhea.  Patient is alert and oriented to self only.  Denies headache, chest pain, shortness of breath, abdominal pain, vomiting, diarrhea or cough.  According to husband patient has some confusion at baseline but she has been more confused today.   Past Medical History:  Diagnosis Date   Diabetes mellitus without complication (HCC)    GERD (gastroesophageal reflux disease)    Herpes zoster    History of chicken pox    Hyperlipidemia    Hypertension    pt denies.  meds are for kidney protection   Osteoarthrosis    Wears dentures    lower partial    Patient Active Problem List   Diagnosis Date Noted   Sepsis due to pneumonia ([place]) 06/27/2020     Past Surgical History:  Procedure Laterality Date   APPENDECTOMY     BACK SURGERY     CATARACT EXTRACTION W/PHACO Right 09/09/2018   Procedure: CATARACT EXTRACTION PHACO AND INTRAOCULAR LENS PLACEMENT (IOC)  RIGHT DIABETIC  01:01.5  16.2%  9.99;  Surgeon: Leandrew Koyanagi, MD;  Location: [place];  Service: Ophthalmology;  Laterality: Right;  Diabetic - oral meds   COLONOSCOPY WITH PROPOFOL N/A 08/21/2015   Procedure: COLONOSCOPY WITH PROPOFOL;  Surgeon: Lollie Sails, MD;  Location: [facility] ENDOSCOPY;  Service: Endoscopy;  Laterality: N/A;    ESOPHAGOGASTRODUODENOSCOPY (EGD) WITH PROPOFOL N/A 06/21/2020   Procedure: ESOPHAGOGASTRODUODENOSCOPY (EGD) WITH PROPOFOL;  Surgeon: [place], Benay Pike, MD;  Location: [facility] ENDOSCOPY;  Service: Gastroenterology;  Laterality: N/A;   JOINT REPLACEMENT Right    knee   TONSILLECTOMY     TUBAL LIGATION      Prior to Admission medications   Medication Sig Start Date End Date Taking? Authorizing Provider  Calcium-Vitamin D (CALTRATE 600 PLUS-VIT D PO) Take 1 tablet by mouth 2 (two) times daily.   Yes [provider]  glipiZIDE (GLUCOTROL XL) 5 MG 24 hr tablet Take 5 mg by mouth daily with breakfast.   Yes [provider]  losartan (COZAAR) 50 MG tablet Take 50 mg by mouth daily.   Yes [provider]  metFORMIN (GLUCOPHAGE) 500 MG tablet Take by mouth 2 (two) times daily with a meal.   Yes [provider]  montelukast (SINGULAIR) 10 MG tablet Take 10 mg by mouth daily as needed.   Yes [provider]  MULTIPLE VITAMIN PO Take 1 tablet by mouth daily.   Yes [provider]  MUPIROCIN EX Apply topically.   Yes [provider]  pantoprazole (PROTONIX) 40 MG tablet Take 1 tablet (40 mg total) by mouth daily. 05/23/20 05/23/21 Yes Paduchowski, Lennette Bihari, MD  progesterone (PROMETRIUM) 100 MG capsule Take 100 mg by mouth daily.   Yes [provider]  psyllium (METAMUCIL) 58.6 % packet Take 1 packet by mouth daily.   Yes [provider]  simvastatin (ZOCOR) 20 MG tablet Take 20 mg by mouth daily.   Yes [provider]  sitaGLIPtin (JANUVIA) 100 MG tablet Take 100 mg by mouth daily.   Yes [provider]  tolterodine (DETROL LA) 4 MG 24 hr capsule Take 2 mg by mouth 2 (two) times daily.    Yes [provider]  venlafaxine (EFFEXOR) 75 MG tablet Take 75 mg by mouth daily.   Yes [provider]  albuterol (VENTOLIN HFA) 108 (90 Base) MCG/ACT inhaler Inhale 1-2 puffs into the lungs every 6 (six) hours  as needed for wheezing or shortness of breath. Patient not taking: No sig reported    [provider]  azelastine (OPTIVAR) 0.05 % ophthalmic solution 1 drop 2 (two) times daily. Patient not taking: No sig reported    [provider]  CEPHALEXIN PO Take by mouth. Patient not taking: No sig reported    [provider]  ondansetron (ZOFRAN ODT) 4 MG disintegrating tablet Take 1 tablet (4 mg total) by mouth every 8 (eight) hours as needed for nausea or vomiting. 05/23/20   Harvest Dark, MD    Allergies Adhesive [tape]  Family History  Problem Relation Age of Onset   Breast cancer Cousin     Social History Social History   Tobacco Use   Smoking status: Former    Packs/day: 1.00    Years: 20.00    Pack years: 20.00    Types: Cigarettes    Quit date: 1999    Years since quitting: 23.4   Smokeless tobacco: Never  Vaping Use   Vaping Use: Never used  Substance Use Topics   Alcohol use: Yes    Comment: may have 1-2 drinks/month   Drug use: No    Review of Systems  Constitutional: + fever, confusion Eyes: Negative for visual changes. ENT: Negative for sore throat. Neck: No neck pain  Cardiovascular: Negative for chest pain. Respiratory: Negative for shortness of breath.+ cough Gastrointestinal: Negative for abdominal pain, vomiting or diarrhea. Genitourinary: Negative for dysuria. Musculoskeletal: Negative for back pain. Skin: Negative for rash. Neurological: Negative for headaches, weakness or numbness. Psych: No SI or HI  ____________________________________________   PHYSICAL EXAM:  VITAL SIGNS: ED Triage Vitals  Enc Vitals Group     BP 06/26/20 2311 (!) 163/73     Pulse Rate 06/26/20 2311 (!) 131     Resp 06/26/20 2311 (!) 24     Temp 06/26/20 2311 (!) 103.3 F (39.6 C)     Temp Source 06/26/20 2311 Oral     SpO2 06/26/20 2311 93 %     Weight --      Height --      Head Circumference --      Peak Flow --      Pain Score  06/26/20 2316 0     Pain Loc --      Pain Edu? --      Excl. in [place]? --     Constitutional: Alert and oriented to self only, in no apparent distress. HEENT:      Head: Normocephalic and atraumatic.         Eyes: Conjunctivae are normal. Sclera is non-icteric.       Mouth/Throat: Mucous membranes are moist.       Neck: Supple with no signs of meningismus. Cardiovascular: Tachycardic with regular rhythm.  Strong pulses in all 4 extremities. respiratory: Increased work of breathing, tachypneic, satting in the low 90s, patient has  decreased breath sounds on the left and crackles on the right.  Gastrointestinal: Soft, non tender, and non distended with positive bowel sounds. No rebound or guarding. Musculoskeletal:  No edema, cyanosis, or erythema of extremities. Neurologic: Normal speech and language. Face is symmetric. Moving all extremities. No gross focal neurologic deficits are appreciated. Skin: Skin is warm, dry and intact. No rash noted. Psychiatric: Mood and affect are normal. Speech and behavior are normal.  ____________________________________________   LABS (all labs ordered are listed, but only abnormal results are displayed)  Labs Reviewed  COMPREHENSIVE METABOLIC PANEL - Abnormal; Notable for the following components:      Result Value   Glucose, Bld 258 (*)    All other components within normal limits  LACTIC ACID, PLASMA - Abnormal; Notable for the following components:   Lactic Acid, Venous 2.5 (*)    All other components within normal limits  CBC WITH DIFFERENTIAL/PLATELET - Abnormal; Notable for the following components:   WBC 12.9 (*)    Neutro Abs 11.1 (*)    All other components within normal limits  RESP PANEL BY RT-PCR (FLU A&B, COVID) ARPGX2  CULTURE, BLOOD (ROUTINE X 2)  CULTURE, BLOOD (ROUTINE X 2)  URINE CULTURE  PROTIME-INR  PROCALCITONIN  LACTIC ACID, PLASMA  URINALYSIS, COMPLETE (UACMP) WITH MICROSCOPIC  PROCALCITONIN    ____________________________________________  EKG  ED ECG REPORT I, Rudene Re, the attending physician, personally viewed and interpreted this ECG.  Sinus tachycardia, rate of 133, right axis deviation, prolonged QTC, no ST elevation, diffuse mild ST depressions. ____________________________________________  RADIOLOGY  I have personally reviewed the images performed during this visit and I agree with the Radiologist's read.   Interpretation by Radiologist:  CT Angio Chest PE W and/or Wo Contrast  Result Date: 06/27/2020 CLINICAL DATA:  Chest pain.  Shortness of breath.  Cough and fever. EXAM: CT ANGIOGRAPHY CHEST WITH CONTRAST TECHNIQUE: Multidetector CT imaging of the chest was performed using the standard protocol during bolus administration of intravenous contrast. Multiplanar CT image reconstructions and MIPs were obtained to evaluate the vascular anatomy. CONTRAST:  80m OMNIPAQUE IOHEXOL 350 MG/ML SOLN COMPARISON:  None. FINDINGS: Cardiovascular: Satisfactory opacification of the pulmonary arteries to the segmental level. Motion artifact diminishes exam detail. There is a single tiny (3 mm) filling defect within the right lower lobar pulmonary artery, equivocal for clinically significant pulmonary embolus, image 53/4. Heart size is within normal limits. No pericardial effusion. Aortic atherosclerosis. Lad coronary artery calcifications. Mediastinum/Nodes: Normal appearance of the thyroid gland. The trachea appears patent and is midline. Normal appearance of the esophagus. No enlarged lymph nodes Lungs/Pleura: Rounded airspace consolidation with surrounding ground-glass attenuation is noted within the right middle lobe measuring approximately 3.8 cm. No pleural effusion or edema. 4 mm perifissural nodule is identified. Upper Abdomen: No acute abnormality Musculoskeletal: No chest wall abnormality. No acute or significant osseous findings. Review of the MIP images confirms the above  findings. IMPRESSION: 1. Motion artifact diminishes exam detail. 2. There is a single tiny (3 mm) filling defect within the right lower lobar pulmonary artery, equivocal for clinically significant pulmonary embolus. 3. Rounded airspace consolidation with surrounding ground-glass attenuation is identified within the right middle lobe. Findings are favored to represent an area of pneumonia. Follow-up imaging is advised to ensure resolution and exclude underlying malignancy. 4. Coronary artery calcifications noted. 5. Aortic atherosclerosis. Aortic Atherosclerosis (ICD10-I70.0). Electronically Signed   By: TKerby MoorsM.D.   On: 06/27/2020 01:42   [facility]  Result Date: 06/26/2020 CLINICAL DATA:  Fever. EXAM: PORTABLE CHEST 1 VIEW COMPARISON:  Chest x-ray 05/08/2011 FINDINGS: The heart size and mediastinal contours are unchanged. Aortic calcification. Interval development of a 4 x 3 cm density at the right costocardiac angle of unclear etiology. No pulmonary edema. No pleural effusion. No pneumothorax. No acute osseous abnormality. IMPRESSION: Interval development of a 4 x 3 cm density at the right costocardiac angle of unclear etiology. Recommend CT chest with intravenous contrast for further evaluation. Electronically Signed   By: Iven Finn M.D.   On: 06/26/2020 23:37     ____________________________________________   PROCEDURES  Procedure(s) performed:yes .1-3 Lead EKG Interpretation  Date/Time: 06/26/2020 11:35 PM Performed by: Rudene Re, MD Authorized by: Rudene Re, MD     Interpretation: abnormal     ECG rate assessment: tachycardic     Rhythm: sinus tachycardia     Ectopy: none     Conduction: abnormal     Critical Care performed: yes  CRITICAL CARE Performed by: Rudene Re  ?  Total critical care time: 30 min  Critical care time was exclusive of separately billable procedures and treating other patients.  Critical care was  necessary to treat or prevent imminent or life-threatening deterioration.  Critical care was time spent personally by me on the following activities: development of treatment plan with patient and/or surrogate as well as nursing, discussions with consultants, evaluation of patient's response to treatment, examination of patient, obtaining history from patient or surrogate, ordering and performing treatments and interventions, ordering and review of laboratory studies, ordering and review of radiographic studies, pulse oximetry and re-evaluation of patient's condition.  ____________________________________________   INITIAL IMPRESSION / ASSESSMENT AND PLAN / ED COURSE   75 y.o. female with a history of diabetes, GERD, herpes zoster, hypertension, hyperlipidemia who presents from home for evaluation of fever, cough and confusion.  Patient is alert and oriented to self only.  Met sepsis criteria on arrival to the emergency room with a temp of 103.73F, tachycardic with a pulse of 131, tachypneic with respiratory rate of 24.  According to husband she has been coughing at home.  She has diminished breath sounds on the left base and crackles on the right.  No pitting edema, no rashes, no meningeal signs.  Differential diagnosis including pneumonia versus COVID versus flu versus urosepsis versus myocarditis versus bacteremia.  Will initiate sepsis protocol with 30 cc/kg bolus, Tylenol, broad-spectrum antibiotics.  We will get COVID and flu, chest x-ray, blood work and cultures.  Patient placed on telemetry for monitoring of cardiorespiratory status.  Old medical records reviewed and had an endoscopy done 5 days ago, therefore will cover broadly with cefepime and Vanco.   _________________________ 1:47 AM on 06/27/2020 ----------------------------------------- COVID and flu negative.  Chest x-ray concerning for an abnormal finding in the right lower lobe and radiologist recommended CT of the chest which was  done.  The abnormal finding correlates with a right lower lobe consolidation pneumonia.  Radiology also reported a small filling defect on the lower branch of the pulmonary artery which is inconclusive for PE.  Discussed this finding with the hospitalist for possible need of anticoagulation.  Patient's vitals have improved after Tylenol and fluids.  Lactic slightly elevated and mild leukocytosis confirming sepsis.  Patient is admitted to the hospitalist service   _____________________________________________ Please note:  Patient was evaluated in Emergency Department today for the symptoms described in the history of present illness. Patient was evaluated in the context of the global  COVID-19 pandemic, which necessitated consideration that the patient might be at risk for infection with the SARS-CoV-2 virus that causes COVID-19. Institutional protocols and algorithms that pertain to the evaluation of patients at risk for COVID-19 are in a state of rapid change based on information released by regulatory bodies including the CDC and federal and state organizations. These policies and algorithms were followed during the patient's care in the ED.  Some ED evaluations and interventions may be delayed as a result of limited staffing during the pandemic.   [place] Controlled Substance Database was reviewed by me. ____________________________________________   FINAL CLINICAL IMPRESSION(S) / ED DIAGNOSES   Final diagnoses:  Sepsis with encephalopathy without septic shock, due to unspecified organism [facility])  Healthcare-associated pneumonia      NEW MEDICATIONS STARTED DURING THIS VISIT:  ED Discharge Orders     None        Note:  This document was prepared using Dragon voice recognition software and may include unintentional dictation errors.    Alfred Levins, [place], MD 06/27/20 [id number]

## 2020-06-26 NOTE — ED Notes (Signed)
ED Provider at bedside. 

## 2020-06-27 ENCOUNTER — Other Ambulatory Visit: Payer: Self-pay

## 2020-06-27 ENCOUNTER — Emergency Department: Payer: Medicare PPO

## 2020-06-27 ENCOUNTER — Inpatient Hospital Stay (HOSPITAL_COMMUNITY)
Admit: 2020-06-27 | Discharge: 2020-06-27 | Disposition: A | Discharge status: Home / Self Care | Payer: Medicare PPO | Attending: Family Medicine | Admitting: Family Medicine

## 2020-06-27 ENCOUNTER — Encounter: Payer: Self-pay | Admitting: Family Medicine

## 2020-06-27 DIAGNOSIS — A4159 Other Gram-negative sepsis: Secondary | ICD-10-CM | POA: Diagnosis present

## 2020-06-27 DIAGNOSIS — R7881 Bacteremia: Secondary | ICD-10-CM | POA: Diagnosis not present

## 2020-06-27 DIAGNOSIS — J181 Lobar pneumonia, unspecified organism: Secondary | ICD-10-CM | POA: Diagnosis not present

## 2020-06-27 DIAGNOSIS — I2699 Other pulmonary embolism without acute cor pulmonale: Secondary | ICD-10-CM | POA: Diagnosis present

## 2020-06-27 DIAGNOSIS — A4150 Gram-negative sepsis, unspecified: Secondary | ICD-10-CM | POA: Diagnosis not present

## 2020-06-27 DIAGNOSIS — I2510 Atherosclerotic heart disease of native coronary artery without angina pectoris: Secondary | ICD-10-CM | POA: Diagnosis present

## 2020-06-27 DIAGNOSIS — E876 Hypokalemia: Secondary | ICD-10-CM | POA: Diagnosis present

## 2020-06-27 DIAGNOSIS — G9340 Encephalopathy, unspecified: Secondary | ICD-10-CM | POA: Diagnosis not present

## 2020-06-27 DIAGNOSIS — J189 Pneumonia, unspecified organism: Secondary | ICD-10-CM | POA: Diagnosis present

## 2020-06-27 DIAGNOSIS — A280 Pasteurellosis: Secondary | ICD-10-CM | POA: Diagnosis present

## 2020-06-27 DIAGNOSIS — I4581 Long QT syndrome: Secondary | ICD-10-CM | POA: Diagnosis not present

## 2020-06-27 DIAGNOSIS — E1165 Type 2 diabetes mellitus with hyperglycemia: Secondary | ICD-10-CM | POA: Diagnosis present

## 2020-06-27 DIAGNOSIS — Z803 Family history of malignant neoplasm of breast: Secondary | ICD-10-CM | POA: Diagnosis not present

## 2020-06-27 DIAGNOSIS — Z6831 Body mass index (BMI) 31.0-31.9, adult: Secondary | ICD-10-CM | POA: Diagnosis not present

## 2020-06-27 DIAGNOSIS — Z20822 Contact with and (suspected) exposure to covid-19: Secondary | ICD-10-CM | POA: Diagnosis present

## 2020-06-27 DIAGNOSIS — Z79899 Other long term (current) drug therapy: Secondary | ICD-10-CM | POA: Diagnosis not present

## 2020-06-27 DIAGNOSIS — N3281 Overactive bladder: Secondary | ICD-10-CM | POA: Diagnosis present

## 2020-06-27 DIAGNOSIS — Z7984 Long term (current) use of oral hypoglycemic drugs: Secondary | ICD-10-CM | POA: Diagnosis not present

## 2020-06-27 DIAGNOSIS — K2900 Acute gastritis without bleeding: Secondary | ICD-10-CM | POA: Diagnosis present

## 2020-06-27 DIAGNOSIS — G9341 Metabolic encephalopathy: Secondary | ICD-10-CM | POA: Diagnosis present

## 2020-06-27 DIAGNOSIS — A419 Sepsis, unspecified organism: Secondary | ICD-10-CM | POA: Diagnosis present

## 2020-06-27 DIAGNOSIS — J690 Pneumonitis due to inhalation of food and vomit: Secondary | ICD-10-CM | POA: Diagnosis not present

## 2020-06-27 DIAGNOSIS — Z87891 Personal history of nicotine dependence: Secondary | ICD-10-CM | POA: Diagnosis not present

## 2020-06-27 DIAGNOSIS — K219 Gastro-esophageal reflux disease without esophagitis: Secondary | ICD-10-CM | POA: Diagnosis present

## 2020-06-27 DIAGNOSIS — R6520 Severe sepsis without septic shock: Secondary | ICD-10-CM | POA: Diagnosis present

## 2020-06-27 DIAGNOSIS — I2609 Other pulmonary embolism with acute cor pulmonale: Secondary | ICD-10-CM

## 2020-06-27 DIAGNOSIS — F32A Depression, unspecified: Secondary | ICD-10-CM | POA: Diagnosis present

## 2020-06-27 DIAGNOSIS — E669 Obesity, unspecified: Secondary | ICD-10-CM | POA: Diagnosis present

## 2020-06-27 DIAGNOSIS — I050 Rheumatic mitral stenosis: Secondary | ICD-10-CM

## 2020-06-27 DIAGNOSIS — I82432 Acute embolism and thrombosis of left popliteal vein: Secondary | ICD-10-CM | POA: Diagnosis present

## 2020-06-27 DIAGNOSIS — I10 Essential (primary) hypertension: Secondary | ICD-10-CM | POA: Diagnosis present

## 2020-06-27 DIAGNOSIS — E785 Hyperlipidemia, unspecified: Secondary | ICD-10-CM | POA: Diagnosis present

## 2020-06-27 HISTORY — DX: Rheumatic mitral stenosis: I05.0

## 2020-06-27 LAB — URINALYSIS, COMPLETE (UACMP) WITH MICROSCOPIC
Bacteria, UA: NONE SEEN
Bilirubin Urine: NEGATIVE
Glucose, UA: >=500 mg/dL — AB
Hgb urine dipstick: NEGATIVE
Ketones, ur: 5 mg/dL — AB
Leukocytes,Ua: NEGATIVE
Nitrite: NEGATIVE
Protein, ur: NEGATIVE mg/dL
Specific Gravity, Urine: 1.035 — ABNORMAL HIGH (ref 1.005–1.030)
pH: 5 (ref 5.0–8.0)

## 2020-06-27 LAB — CBC
HCT: 37.6 % (ref 36.0–46.0)
Hemoglobin: 12.8 g/dL (ref 12.0–15.0)
MCH: 29.9 pg (ref 26.0–34.0)
MCHC: 34 g/dL (ref 30.0–36.0)
MCV: 87.9 fL (ref 80.0–100.0)
Platelets: 171 10*3/uL (ref 150–400)
RBC: 4.28 MIL/uL (ref 3.87–5.11)
RDW: 12.2 % (ref 11.5–15.5)
WBC: 12.1 10*3/uL — ABNORMAL HIGH (ref 4.0–10.5)
nRBC: 0 % (ref 0.0–0.2)

## 2020-06-27 LAB — BLOOD CULTURE ID PANEL (REFLEXED) - BCID2

## 2020-06-27 LAB — ECHOCARDIOGRAM COMPLETE
AR max vel: 2.07 cm2
AV Area VTI: 2.03 cm2
AV Area mean vel: 2.16 cm2
AV Mean grad: 9 mmHg
AV Peak grad: 15.4 mmHg
Ao pk vel: 1.96 m/s
Area-P 1/2: 7.16 cm2
MV VTI: 2.56 cm2
S' Lateral: 3 cm

## 2020-06-27 LAB — BASIC METABOLIC PANEL
Anion gap: 5 (ref 5–15)
BUN: 13 mg/dL (ref 8–23)
CO2: 29 mmol/L (ref 22–32)
Calcium: 8.5 mg/dL — ABNORMAL LOW (ref 8.9–10.3)
Chloride: 103 mmol/L (ref 98–111)
Creatinine, Ser: 0.85 mg/dL (ref 0.44–1.00)
GFR, Estimated: >60 mL/min (ref >60)
Glucose, Bld: 224 mg/dL — ABNORMAL HIGH (ref 70–99)
Potassium: 4.1 mmol/L (ref 3.5–5.1)
Sodium: 137 mmol/L (ref 135–145)

## 2020-06-27 LAB — CBG MONITORING, ED
Glucose-Capillary: 202 mg/dL — ABNORMAL HIGH (ref 70–99)
Glucose-Capillary: 180 mg/dL — ABNORMAL HIGH (ref 70–99)
Glucose-Capillary: 168 mg/dL — ABNORMAL HIGH (ref 70–99)
Glucose-Capillary: 243 mg/dL — ABNORMAL HIGH (ref 70–99)
Glucose-Capillary: 174 mg/dL — ABNORMAL HIGH (ref 70–99)

## 2020-06-27 LAB — PROCALCITONIN
Procalcitonin: <0.1 ng/mL
Procalcitonin: 0.82 ng/mL

## 2020-06-27 LAB — RESP PANEL BY RT-PCR (FLU A&B, COVID) ARPGX2
Influenza A by PCR: NEGATIVE
Influenza B by PCR: NEGATIVE
SARS Coronavirus 2 by RT PCR: NEGATIVE

## 2020-06-27 LAB — LACTIC ACID, PLASMA: Lactic Acid, Venous: 1.7 mmol/L (ref 0.5–1.9)

## 2020-06-27 LAB — HIV ANTIBODY (ROUTINE TESTING W REFLEX): HIV Screen 4th Generation wRfx: NONREACTIVE

## 2020-06-27 LAB — STREP PNEUMONIAE URINARY ANTIGEN: Strep Pneumo Urinary Antigen: NEGATIVE

## 2020-06-27 MED ORDER — KETOROLAC TROMETHAMINE 30 MG/ML IJ SOLN: 15.0000 mg | Freq: Four times a day (QID) | INTRAMUSCULAR | Status: DC | PRN

## 2020-06-27 MED ORDER — IOHEXOL 350 MG/ML SOLN
75.0000 mL | Freq: Once | INTRAVENOUS | Status: AC | PRN
  Administered 2020-06-27: 75 mL via INTRAVENOUS

## 2020-06-27 MED ORDER — MAGNESIUM HYDROXIDE 400 MG/5ML PO SUSP: 30.0000 mL | Freq: Every day | ORAL | Status: DC | PRN

## 2020-06-27 MED ORDER — GLIPIZIDE ER 5 MG PO TB24: 5.0000 mg | ORAL_TABLET | Freq: Every day | ORAL | Status: DC

## 2020-06-27 MED ORDER — LACTATED RINGERS IV SOLN: INTRAVENOUS | Status: AC

## 2020-06-27 MED ORDER — ENOXAPARIN SODIUM 40 MG/0.4ML IJ SOSY: 40.0000 mg | PREFILLED_SYRINGE | INTRAMUSCULAR | Status: DC

## 2020-06-27 MED ORDER — ACETAMINOPHEN 325 MG PO TABS
650.0000 mg | ORAL_TABLET | Freq: Four times a day (QID) | ORAL | Status: DC | PRN
  Administered 2020-06-28: 650 mg via ORAL
  Filled 2020-06-27: qty 2

## 2020-06-27 MED ORDER — MONTELUKAST SODIUM 10 MG PO TABS
10.0000 mg | ORAL_TABLET | Freq: Every day | ORAL | Status: DC | PRN
  Filled 2020-06-27: qty 1

## 2020-06-27 MED ORDER — TRAZODONE HCL 50 MG PO TABS
25.0000 mg | ORAL_TABLET | Freq: Every evening | ORAL | Status: DC | PRN
  Administered 2020-06-30: 25 mg via ORAL
  Filled 2020-06-27: qty 1

## 2020-06-27 MED ORDER — APIXABAN 5 MG PO TABS: 5.0000 mg | ORAL_TABLET | Freq: Two times a day (BID) | ORAL | Status: DC

## 2020-06-27 MED ORDER — ADULT MULTIVITAMIN W/MINERALS CH
1.0000 | ORAL_TABLET | Freq: Every day | ORAL | Status: DC
  Administered 2020-06-27 – 2020-07-01 (×5): 1 via ORAL
  Filled 2020-06-27 (×5): qty 1

## 2020-06-27 MED ORDER — VANCOMYCIN HCL 2000 MG/400ML IV SOLN
2000.0000 mg | Freq: Once | INTRAVENOUS | Status: DC
  Filled 2020-06-27: qty 400

## 2020-06-27 MED ORDER — SIMVASTATIN 20 MG PO TABS
20.0000 mg | ORAL_TABLET | Freq: Every day | ORAL | Status: DC
  Administered 2020-06-27 – 2020-07-01 (×5): 20 mg via ORAL
  Filled 2020-06-27 (×2): qty 1
  Filled 2020-06-27: qty 2
  Filled 2020-06-27 (×2): qty 1

## 2020-06-27 MED ORDER — LINAGLIPTIN 5 MG PO TABS: 5.0000 mg | ORAL_TABLET | Freq: Every day | ORAL | Status: DC

## 2020-06-27 MED ORDER — PANTOPRAZOLE SODIUM 40 MG PO TBEC
40.0000 mg | DELAYED_RELEASE_TABLET | Freq: Every day | ORAL | Status: DC
  Administered 2020-06-27 – 2020-07-01 (×5): 40 mg via ORAL
  Filled 2020-06-27 (×5): qty 1

## 2020-06-27 MED ORDER — SODIUM CHLORIDE 0.9 % IV SOLN
2.0000 g | INTRAVENOUS | Status: DC
  Administered 2020-06-27: 2 g via INTRAVENOUS
  Filled 2020-06-27: qty 20

## 2020-06-27 MED ORDER — SODIUM CHLORIDE 0.9 % IV SOLN
500.0000 mg | INTRAVENOUS | Status: DC
  Administered 2020-06-27 – 2020-06-29 (×3): 500 mg via INTRAVENOUS
  Filled 2020-06-27 (×4): qty 500

## 2020-06-27 MED ORDER — INSULIN ASPART 100 UNIT/ML IJ SOLN
0.0000 [IU] | Freq: Three times a day (TID) | INTRAMUSCULAR | Status: DC
  Administered 2020-06-27: 5 [IU] via SUBCUTANEOUS
  Administered 2020-06-27: 3 [IU] via SUBCUTANEOUS
  Administered 2020-06-27: 5 [IU] via SUBCUTANEOUS
  Administered 2020-06-27 – 2020-06-28 (×3): 3 [IU] via SUBCUTANEOUS
  Administered 2020-06-28: 6 [IU] via SUBCUTANEOUS
  Administered 2020-06-28: 8 [IU] via SUBCUTANEOUS
  Administered 2020-06-29: 5 [IU] via SUBCUTANEOUS
  Administered 2020-06-29: 2 [IU] via SUBCUTANEOUS
  Administered 2020-06-29: 11 [IU] via SUBCUTANEOUS
  Administered 2020-06-29: 3 [IU] via SUBCUTANEOUS
  Administered 2020-06-30 (×2): 5 [IU] via SUBCUTANEOUS
  Administered 2020-06-30: 3 [IU] via SUBCUTANEOUS
  Administered 2020-06-30: 5 [IU] via SUBCUTANEOUS
  Administered 2020-07-01: 8 [IU] via SUBCUTANEOUS
  Filled 2020-06-27 (×17): qty 1

## 2020-06-27 MED ORDER — ONDANSETRON HCL 4 MG PO TABS: 4.0000 mg | ORAL_TABLET | Freq: Four times a day (QID) | ORAL | Status: DC | PRN

## 2020-06-27 MED ORDER — ONDANSETRON HCL 4 MG/2ML IJ SOLN: 4.0000 mg | Freq: Four times a day (QID) | INTRAMUSCULAR | Status: DC | PRN

## 2020-06-27 MED ORDER — SODIUM CHLORIDE 0.9 % IV SOLN
2.0000 g | Freq: Once | INTRAVENOUS | Status: AC
  Administered 2020-06-27: 2 g via INTRAVENOUS
  Filled 2020-06-27: qty 2

## 2020-06-27 MED ORDER — ONDANSETRON 4 MG PO TBDP: 4.0000 mg | ORAL_TABLET | Freq: Three times a day (TID) | ORAL | Status: DC | PRN

## 2020-06-27 MED ORDER — FUROSEMIDE 10 MG/ML IJ SOLN: 40.0000 mg | Freq: Two times a day (BID) | INTRAMUSCULAR | Status: DC

## 2020-06-27 MED ORDER — GUAIFENESIN ER 600 MG PO TB12
600.0000 mg | ORAL_TABLET | Freq: Two times a day (BID) | ORAL | Status: DC
  Administered 2020-06-27 – 2020-07-01 (×10): 600 mg via ORAL
  Filled 2020-06-27 (×10): qty 1

## 2020-06-27 MED ORDER — ACETAMINOPHEN 650 MG RE SUPP: 650.0000 mg | Freq: Four times a day (QID) | RECTAL | Status: DC | PRN

## 2020-06-27 MED ORDER — HYDROCOD POLST-CPM POLST ER 10-8 MG/5ML PO SUER
5.0000 mL | Freq: Two times a day (BID) | ORAL | Status: DC | PRN
  Administered 2020-06-27: 5 mL via ORAL
  Filled 2020-06-27: qty 5

## 2020-06-27 MED ORDER — LOSARTAN POTASSIUM 50 MG PO TABS
50.0000 mg | ORAL_TABLET | Freq: Every day | ORAL | Status: DC
  Administered 2020-06-27 – 2020-07-01 (×5): 50 mg via ORAL
  Filled 2020-06-27 (×5): qty 1

## 2020-06-27 MED ORDER — VENLAFAXINE HCL ER 75 MG PO CP24
75.0000 mg | ORAL_CAPSULE | Freq: Every day | ORAL | Status: DC
  Administered 2020-06-27 – 2020-07-01 (×5): 75 mg via ORAL
  Filled 2020-06-27 (×6): qty 1

## 2020-06-27 MED ORDER — PROGESTERONE MICRONIZED 100 MG PO CAPS
100.0000 mg | ORAL_CAPSULE | Freq: Every day | ORAL | Status: DC
  Administered 2020-06-27 – 2020-07-01 (×5): 100 mg via ORAL
  Filled 2020-06-27 (×5): qty 1

## 2020-06-27 MED ORDER — PSYLLIUM 95 % PO PACK
1.0000 | PACK | Freq: Every day | ORAL | Status: DC
  Administered 2020-06-28 – 2020-06-30 (×2): 1 via ORAL
  Filled 2020-06-27 (×5): qty 1

## 2020-06-27 MED ORDER — PIPERACILLIN-TAZOBACTAM 3.375 G IVPB
3.3750 g | Freq: Three times a day (TID) | INTRAVENOUS | Status: DC
  Administered 2020-06-27 – 2020-06-28 (×3): 3.375 g via INTRAVENOUS
  Filled 2020-06-27 (×3): qty 50

## 2020-06-27 MED ORDER — VANCOMYCIN HCL IN DEXTROSE 1-5 GM/200ML-% IV SOLN
1000.0000 mg | Freq: Once | INTRAVENOUS | Status: DC
  Administered 2020-06-27: 1000 mg via INTRAVENOUS
  Filled 2020-06-27: qty 200

## 2020-06-27 MED ORDER — FESOTERODINE FUMARATE ER 4 MG PO TB24
4.0000 mg | ORAL_TABLET | Freq: Every day | ORAL | Status: DC
  Administered 2020-06-27 – 2020-07-01 (×5): 4 mg via ORAL
  Filled 2020-06-27 (×5): qty 1

## 2020-06-27 MED ORDER — IPRATROPIUM-ALBUTEROL 0.5-2.5 (3) MG/3ML IN SOLN
3.0000 mL | Freq: Four times a day (QID) | RESPIRATORY_TRACT | Status: DC
  Administered 2020-06-27 – 2020-06-28 (×5): 3 mL via RESPIRATORY_TRACT
  Filled 2020-06-27 (×5): qty 3

## 2020-06-27 MED ORDER — VANCOMYCIN HCL IN DEXTROSE 1-5 GM/200ML-% IV SOLN
1000.0000 mg | Freq: Once | INTRAVENOUS | Status: AC
  Administered 2020-06-27: 1000 mg via INTRAVENOUS
  Filled 2020-06-27: qty 200

## 2020-06-27 MED ORDER — APIXABAN 5 MG PO TABS
10.0000 mg | ORAL_TABLET | Freq: Two times a day (BID) | ORAL | Status: DC
  Administered 2020-06-27 – 2020-07-01 (×9): 10 mg via ORAL
  Filled 2020-06-27 (×9): qty 2

## 2020-06-27 NOTE — Progress Notes (Addendum)
PHARMACY - PHYSICIAN COMMUNICATION CRITICAL VALUE ALERT - BLOOD CULTURE IDENTIFICATION (BCID)  Madison Cuevas is an 75 y.o. female who presented to [facility] on [facility]/26/2020 with a chief complaint of cough, fever, confusion  Assessment:  Blood cultures from 6/20 = GNR, no organisms detected on BCID.  Source remains unclear, ? PNA.  Underwent EGD 6/15 for N/V (EGD results - gastritis, neg H pylori). Per notes sounds like abdominal exam unrevealing.   Name of physician (or Provider) Contacted: Dr Mayford Knife  Current antibiotics: Ceftriaxone/azithromycin  Changes to prescribed antibiotics recommended:  Recommendations accepted by provider - broaden to pip/tazo for now  Results for orders placed or performed during the hospital encounter of 06/26/20  Blood Culture ID Panel (Reflexed) (Collected: 06/27/2020 12:25 AM)  Result Value Ref Range   Enterococcus faecalis NOT DETECTED NOT DETECTED   Enterococcus Faecium NOT DETECTED NOT DETECTED   Listeria monocytogenes NOT DETECTED NOT DETECTED   Staphylococcus species NOT DETECTED NOT DETECTED   Staphylococcus aureus (BCID) NOT DETECTED NOT DETECTED   Staphylococcus epidermidis NOT DETECTED NOT DETECTED   Staphylococcus lugdunensis NOT DETECTED NOT DETECTED   Streptococcus species NOT DETECTED NOT DETECTED   Streptococcus agalactiae NOT DETECTED NOT DETECTED   Streptococcus pneumoniae NOT DETECTED NOT DETECTED   Streptococcus pyogenes NOT DETECTED NOT DETECTED   A.calcoaceticus-baumannii NOT DETECTED NOT DETECTED   Bacteroides fragilis NOT DETECTED NOT DETECTED   Enterobacterales NOT DETECTED NOT DETECTED   Enterobacter cloacae complex NOT DETECTED NOT DETECTED   Escherichia coli NOT DETECTED NOT DETECTED   Klebsiella aerogenes NOT DETECTED NOT DETECTED   Klebsiella oxytoca NOT DETECTED NOT DETECTED   Klebsiella pneumoniae NOT DETECTED NOT DETECTED   Proteus species NOT DETECTED NOT DETECTED   Salmonella species NOT DETECTED NOT DETECTED    Serratia marcescens NOT DETECTED NOT DETECTED   Haemophilus influenzae NOT DETECTED NOT DETECTED   Neisseria meningitidis NOT DETECTED NOT DETECTED   Pseudomonas aeruginosa NOT DETECTED NOT DETECTED   Stenotrophomonas maltophilia NOT DETECTED NOT DETECTED   Candida albicans NOT DETECTED NOT DETECTED   Candida auris NOT DETECTED NOT DETECTED   Candida glabrata NOT DETECTED NOT DETECTED   Candida krusei NOT DETECTED NOT DETECTED   Candida parapsilosis NOT DETECTED NOT DETECTED   Candida tropicalis NOT DETECTED NOT DETECTED   Cryptococcus neoformans/gattii NOT DETECTED NOT DETECTED    Juliette Alcide, PharmD, BCPS.   Work Cell: [phone] 06/27/2020 12:35 PM

## 2020-06-27 NOTE — Progress Notes (Signed)
CODE SEPSIS - PHARMACY COMMUNICATION  **Broad Spectrum Antibiotics should be administered within 1 hour of Sepsis diagnosis**  Time Code Sepsis Called/Page Received:  6/21 @ 0025  Antibiotics Ordered: Vancomycin 2 gm IV X 1 and Cefepime 2 gm IV X 1    Time of 1st antibiotic administration: 6/21 @ 0045  Vancomycin 2 gm IV X 1   Additional action taken by pharmacy:   If necessary, Name of Provider/Nurse Contacted:     Larnie Heart D ,PharmD Clinical Pharmacist  06/27/2020  1:12 AM

## 2020-06-27 NOTE — Progress Notes (Addendum)
PROGRESS NOTE   HPI was taken from Dr. Sidney Ace: Madison Cuevas is a 75 y.o. female with medical history significant for type 2 diabetes mellitus, GERD, hypertension and dyslipidemia who presented to the emergency room with acute onset of productive cough and wheezing which have been going on for a week as well as fever today with associated chills and altered mental status noted by her husband.  She admitted to right posterior chest/scapular pain increasing with deep breathing.  No current nausea or vomiting or abdominal pain.  No dysuria, oliguria or hematuria or flank pain. 6 days ago the patient had an EGD by Dr. Alice Reichert that revealed acute gastritis  ED Course: Upon presentation to the emergency room, blood pressure was 163/73 with a heart rate of 131 and respiratory 24 temperature of 103.3 and pulse 93 and later 95 % on room air.  Labs revealed blood glucose of 258 with otherwise normal vital signs.  Lactic acid was 2.5 and later 1.7.  CBC showed leukocytosis 12.9 with neutrophilia.  Influenza antigens and COVID-19 PCR came back negative.  Urinalysis showed more than 500 glucose.  Urine and blood cultures were sent.   EKG as reviewed by me : Showed sinus tachycardia with a rate of 133 with probable left atrial enlargement, right axis deviation and prolonged QT interval with QTC of 558 MS Imaging: Chest x-ray showed interval development of 4X 3 cm density at the right costal cardiac angle of unclear etiology with recommendation for CT. Chest CTA revealed single tiny 3 mm filling defect within the right lower lobe pulmonary artery equivocal for clinical significant pulmonary embolus, rounded airspace consolidation with surrounding groundglass attenuation in the right middle lobe concerning for pneumonia with recommendation for follow-up.  The patient was given IV lactated Ringer bolus, 1 g of p.o. Tylenol as well as IV vancomycin and IV cefepime.  She will be admitted to a progressive unit bed for  further evaluation and management.  Madison Cuevas  GOT:[id number] DOB: 03/05/1945 DOA: 06/26/2020 PCP: Derinda Late, MD    Assessment & Plan:   Active Problems:   Sepsis due to pneumonia ([place])   Sepsis: present on admission, met criteria w/ fever, tachypnea, tachycardia, leukocytosis, elevated lactic acid and pneumonia & bacteremia. Continue on IV azithromycin & start zosyn. Continue on bronchodilators  Bacteremia: blood cxs growing gram neg rods, sens pending. Etiology unclear. Urine cx is pending. No vegetations noted on echo  Pneumonia: continue on IV rocephin, azithromycin, bronchodilators and encourage incentive spirometry. Legionella, strep ordered  Leukocytosis: likely secondary to above infections. Continue on abxs  Pulmonary embolism: continue on eliquis   DM2: likely poorly controlled. Continue on SSI w/ accuchecks  HTN: continue on home dose of losartan  Depression: severity unknown. Continue on home dose of effexor  HLD: continue on statin   Recent acute gastritis: continue on PPI  Overactive bladder: continue on home dose of fesoterodine    DVT prophylaxis: eliquis Code Status: full  Family Communication: discussed pt's care w/ pt's husband at bedside and answered his questions  Disposition Plan: depends on PT/OT recs (not consulted yet)  Level of care: Progressive Cardiac  Status is: Inpatient  Remains inpatient appropriate because:Ongoing diagnostic testing needed not appropriate for outpatient work up, IV treatments appropriate due to intensity of illness or inability to take PO, and Inpatient level of care appropriate due to severity of illness  Dispo: The patient is from: Home  Anticipated d/c is to: Home              Patient currently is not medically stable to d/c.   Difficult to place patient : unclear        Consultants:    Procedures:   Antimicrobials: azithromycin, zosyn    Subjective: Pt c/o productive cough    Objective: Vitals:   06/27/20 0423 06/27/20 0430 06/27/20 0500 06/27/20 0700  BP:  135/75 (!) 146/63 (!) 144/61  Pulse:  (!) 117 (!) 110 (!) 109  Resp:  (!) 21 (!) 27 20  Temp: 99.6 F (37.6 C)     TempSrc: Oral     SpO2:  97% 97% 94%    Intake/Output Summary (Last 24 hours) at 06/27/2020 0756 Last data filed at 06/27/2020 0700 Gross per 24 hour  Intake 750 ml  Output 15 ml  Net 735 ml   There were no vitals filed for this visit.  Examination:  General exam: Appears calm and comfortable  Respiratory system: diminished breath sounds b/l Cardiovascular system: S1 & S2 +. No  rubs, gallops or clicks.  Gastrointestinal system: Abdomen is nondistended, soft and nontender.Normal bowel sounds heard. Central nervous system: Alert and oriented. Moves all extremities  Psychiatry: Judgement and insight appear normal. Flat mood and affect    Data Reviewed: I have personally reviewed following labs and imaging studies  CBC: Recent Labs  Lab 06/26/20 2317 06/27/20 0504  WBC 12.9* 12.1*  NEUTROABS 11.1*  --   HGB 14.2 12.8  HCT 41.3 37.6  MCV 88.1 87.9  PLT 191 409   Basic Metabolic Panel: Recent Labs  Lab 06/26/20 2317 06/27/20 0504  NA 135 137  K 4.1 4.1  CL 103 103  CO2 23 29  GLUCOSE 258* 224*  BUN 18 13  CREATININE 0.89 0.85  CALCIUM 9.1 8.5*   GFR: Estimated Creatinine Clearance: 64.8 mL/min (by C-G formula based on SCr of 0.85 mg/dL). Liver Function Tests: Recent Labs  Lab 06/26/20 2317  AST 22  ALT 13  ALKPHOS 46  BILITOT 0.6  PROT 7.4  ALBUMIN 4.3   No results for input(s): LIPASE, AMYLASE in the last 168 hours. No results for input(s): AMMONIA in the last 168 hours. Coagulation Profile: Recent Labs  Lab 06/26/20 2317  INR 1.0   Cardiac Enzymes: No results for input(s): CKTOTAL, CKMB, CKMBINDEX, TROPONINI in the last 168 hours. BNP (last 3 results) No results for input(s): PROBNP in the last 8760 hours. HbA1C: No results for input(s):  HGBA1C in the last 72 hours. CBG: No results for input(s): GLUCAP in the last 168 hours. Lipid Profile: No results for input(s): CHOL, HDL, LDLCALC, TRIG, CHOLHDL, LDLDIRECT in the last 72 hours. Thyroid Function Tests: No results for input(s): TSH, T4TOTAL, FREET4, T3FREE, THYROIDAB in the last 72 hours. Anemia Panel: No results for input(s): VITAMINB12, FOLATE, FERRITIN, TIBC, IRON, RETICCTPCT in the last 72 hours. Sepsis Labs: Recent Labs  Lab 06/26/20 2317 06/27/20 0200 06/27/20 0504  PROCALCITON <0.10  --  0.82  LATICACIDVEN 2.5* 1.7  --     Recent Results (from the past 240 hour(s))  Culture, blood (Routine x 2)     Status: None (Preliminary result)   Collection Time: 06/26/20 11:17 PM   Specimen: BLOOD  Result Value Ref Range Status   Specimen Description BLOOD RIGHT FOREARM  Final   Special Requests   Final    BOTTLES DRAWN AEROBIC AND ANAEROBIC Blood Culture results may not be optimal due  to an excessive volume of blood received in culture bottles   Culture   Final    NO GROWTH < 12 HOURS Performed at [facility], [address] [place], [place] [address]    Report Status PENDING  Incomplete  Resp Panel by RT-PCR (Flu A&B, Covid) Nasopharyngeal Swab     Status: None   Collection Time: 06/26/20 11:18 PM   Specimen: Nasopharyngeal Swab; Nasopharyngeal(NP) swabs in vial transport medium  Result Value Ref Range Status   SARS Coronavirus 2 by RT PCR NEGATIVE NEGATIVE Final    Comment: (NOTE) SARS-CoV-2 target nucleic acids are NOT DETECTED.  The SARS-CoV-2 RNA is generally detectable in upper respiratory specimens during the acute phase of infection. The lowest concentration of SARS-CoV-2 viral copies this assay can detect is 138 copies/mL. A negative result does not preclude SARS-Cov-2 infection and should not be used as the sole basis for treatment or other patient management decisions. A negative result may occur with  improper specimen  collection/handling, submission of specimen other than nasopharyngeal swab, presence of viral mutation(s) within the areas targeted by this assay, and inadequate number of viral copies(<138 copies/mL). A negative result must be combined with clinical observations, patient history, and epidemiological information. The expected result is Negative.  Fact Sheet for Patients:  EntrepreneurPulse.com.au  Fact Sheet for Healthcare Providers:  IncredibleEmployment.be  This test is no t yet approved or cleared by the [place] FDA and  has been authorized for detection and/or diagnosis of SARS-CoV-2 by FDA under an Emergency Use Authorization (EUA). This EUA will remain  in effect (meaning this test can be used) for the duration of the COVID-19 declaration under Section 564(b)(1) of the Act, 21 U.S.C.section 360bbb-3(b)(1), unless the authorization is terminated  or revoked sooner.       Influenza A by PCR NEGATIVE NEGATIVE Final   Influenza B by PCR NEGATIVE NEGATIVE Final    Comment: (NOTE) The Xpert Xpress SARS-CoV-2/FLU/RSV plus assay is intended as an aid in the diagnosis of influenza from Nasopharyngeal swab specimens and should not be used as a sole basis for treatment. Nasal washings and aspirates are unacceptable for Xpert Xpress SARS-CoV-2/FLU/RSV testing.  Fact Sheet for Patients: EntrepreneurPulse.com.au  Fact Sheet for Healthcare Providers: IncredibleEmployment.be  This test is not yet approved or cleared by the [place] FDA and has been authorized for detection and/or diagnosis of SARS-CoV-2 by FDA under an Emergency Use Authorization (EUA). This EUA will remain in effect (meaning this test can be used) for the duration of the COVID-19 declaration under Section 564(b)(1) of the Act, 21 U.S.C. section 360bbb-3(b)(1), unless the authorization is terminated or revoked.  Performed at [facility], [address] [place], [place] [address]   Culture, blood (Routine x 2)     Status: None (Preliminary result)   Collection Time: 06/27/20 12:25 AM   Specimen: BLOOD  Result Value Ref Range Status   Specimen Description BLOOD LEFT ASSIST CONTROL  Final   Special Requests   Final    BOTTLES DRAWN AEROBIC AND ANAEROBIC Blood Culture adequate volume   Culture   Final    NO GROWTH < 12 HOURS Performed at [facility], [address], [place],  [address]    Report Status PENDING  Incomplete         Radiology Studies: CT Angio Chest PE W and/or Wo Contrast  Result Date: 06/27/2020 CLINICAL DATA:  Chest pain.  Shortness of breath.  Cough and fever. EXAM: CT  ANGIOGRAPHY CHEST WITH CONTRAST TECHNIQUE: Multidetector CT imaging of the chest was performed using the standard protocol during bolus administration of intravenous contrast. Multiplanar CT image reconstructions and MIPs were obtained to evaluate the vascular anatomy. CONTRAST:  28m OMNIPAQUE IOHEXOL 350 MG/ML SOLN COMPARISON:  None. FINDINGS: Cardiovascular: Satisfactory opacification of the pulmonary arteries to the segmental level. Motion artifact diminishes exam detail. There is a single tiny (3 mm) filling defect within the right lower lobar pulmonary artery, equivocal for clinically significant pulmonary embolus, image 53/4. Heart size is within normal limits. No pericardial effusion. Aortic atherosclerosis. Lad coronary artery calcifications. Mediastinum/Nodes: Normal appearance of the thyroid gland. The trachea appears patent and is midline. Normal appearance of the esophagus. No enlarged lymph nodes Lungs/Pleura: Rounded airspace consolidation with surrounding ground-glass attenuation is noted within the right middle lobe measuring approximately 3.8 cm. No pleural effusion or edema. 4 mm perifissural nodule is identified. Upper Abdomen: No acute abnormality Musculoskeletal: No chest wall abnormality. No  acute or significant osseous findings. Review of the MIP images confirms the above findings. IMPRESSION: 1. Motion artifact diminishes exam detail. 2. There is a single tiny (3 mm) filling defect within the right lower lobar pulmonary artery, equivocal for clinically significant pulmonary embolus. 3. Rounded airspace consolidation with surrounding ground-glass attenuation is identified within the right middle lobe. Findings are favored to represent an area of pneumonia. Follow-up imaging is advised to ensure resolution and exclude underlying malignancy. 4. Coronary artery calcifications noted. 5. Aortic atherosclerosis. Aortic Atherosclerosis (ICD10-I70.0). Electronically Signed   By: TKerby MoorsM.D.   On: 06/27/2020 01:42   DG Chest Port 1 View  Result Date: 06/26/2020 CLINICAL DATA:  Fever. EXAM: PORTABLE CHEST 1 VIEW COMPARISON:  Chest x-ray 05/08/2011 FINDINGS: The heart size and mediastinal contours are unchanged. Aortic calcification. Interval development of a 4 x 3 cm density at the right costocardiac angle of unclear etiology. No pulmonary edema. No pleural effusion. No pneumothorax. No acute osseous abnormality. IMPRESSION: Interval development of a 4 x 3 cm density at the right costocardiac angle of unclear etiology. Recommend CT chest with intravenous contrast for further evaluation. Electronically Signed   By: MIven FinnM.D.   On: 06/26/2020 23:37        Scheduled Meds:  apixaban  10 mg Oral BID   fesoterodine  4 mg Oral Daily   glipiZIDE  5 mg Oral Q breakfast   guaiFENesin  600 mg Oral BID   insulin aspart  0-15 Units Subcutaneous TID AC & HS   ipratropium-albuterol  3 mL Nebulization QID   linagliptin  5 mg Oral Daily   losartan  50 mg Oral Daily   multivitamin with minerals  1 tablet Oral Daily   pantoprazole  40 mg Oral Daily   progesterone  100 mg Oral Daily   psyllium  1 packet Oral Daily   simvastatin  20 mg Oral Daily   venlafaxine  75 mg Oral Daily    Continuous Infusions:  azithromycin Stopped (06/27/20 0451)   cefTRIAXone (ROCEPHIN)  IV Stopped (06/27/20 0700)   lactated ringers 150 mL/hr at 06/27/20 0503     LOS: 0 days    Time spent:33 mins     JWyvonnia Dusky MD [facility] Pager [phone]  If 7PM-7AM, please contact night-coverage 06/27/2020, 7:56 AM

## 2020-06-27 NOTE — ED Notes (Signed)
Hospitalist at bedside 

## 2020-06-27 NOTE — ED Triage Notes (Signed)
Pt presents via EMS from home for c/o cough with fever and increased confusion today. Per EMS, husband states the patient is normally confused but is worse than normal and appeared to have unsteady gait. Also noted the patient's urine has been dark. Denied N/V/D. Pt pleasantly confused upon arrival.

## 2020-06-27 NOTE — ED Notes (Signed)
Pt given breakfast.

## 2020-06-27 NOTE — Progress Notes (Signed)
PHARMACY -  BRIEF ANTIBIOTIC NOTE   Pharmacy has received consult(s) for Vancomycin, Cefepime  from an ED provider.  The patient's profile has been reviewed for ht/wt/allergies/indication/available labs.    One time order(s) placed for Cefepime 2 gm IV x 1 and Vancomycin 2 gm IV X 1.   Further antibiotics/pharmacy consults should be ordered by admitting physician if indicated.                       Thank you, Ghazal Pevey D 06/27/2020  12:44 AM

## 2020-06-27 NOTE — Progress Notes (Signed)
*  PRELIMINARY RESULTS* Echocardiogram 2D Echocardiogram has been performed.  Joanette Gula Gilbert [place] 06/27/2020, 9:18 AM

## 2020-06-27 NOTE — ED Notes (Signed)
Pt drank peach milkshake provided by family member. Discussed low carb diet per orders with pt and family and they verbalized understanding.

## 2020-06-27 NOTE — ED Notes (Signed)
Verified with lab that UA was received.

## 2020-06-27 NOTE — H&P (Signed)
[place]   PATIENT NAME: Madison Cuevas    MR#:  [id number]  DATE OF BIRTH:  [birth year] C[birth year]  DATE OF ADMISSION:  06/26/2020  PRIMARY CARE PHYSICIAN: Kandyce Rud, MD   Patient is coming from: Home  REQUESTING/REFERRING PHYSICIAN: Nita Sickle, MD  CHIEF COMPLAINT:   Chief Complaint  Patient presents with  . Code Sepsis  . Cough    HISTORY OF PRESENT ILLNESS:  Madison Cuevas is a 75 y.o. female with medical history significant for type 2 diabetes mellitus, GERD, hypertension and dyslipidemia who presented to the emergency room with acute onset of productive cough and wheezing which have been going on for a week as well as fever today with associated chills and altered mental status noted by her husband.  She admitted to right posterior chest/scapular pain increasing with deep breathing.  No current nausea or vomiting or abdominal pain.  No dysuria, oliguria or hematuria or flank pain. 6 days ago the patient had an EGD by Dr. Norma Fredrickson that revealed acute gastritis  ED Course: Upon presentation to the emergency room, blood pressure was 163/73 with a heart rate of 131 and respiratory 24 temperature of 103.3 and pulse 93 and later 95 % on room air.  Labs revealed blood glucose of 258 with otherwise normal vital signs.  Lactic acid was 2.5 and later 1.7.  CBC showed leukocytosis 12.9 with neutrophilia.  Influenza antigens and COVID-19 PCR came back negative.  Urinalysis showed more than 500 glucose.  Urine and blood cultures were sent.  EKG as reviewed by me : Showed sinus tachycardia with a rate of 133 with probable left atrial enlargement, right axis deviation and prolonged QT interval with QTC of 558 MS Imaging: Chest x-ray showed interval development of 4X 3 cm density at the right costal cardiac angle of unclear etiology with recommendation for CT. Chest CTA revealed single tiny 3 mm filling defect within the right lower lobe pulmonary artery equivocal for clinical significant  pulmonary embolus, rounded airspace consolidation with surrounding groundglass attenuation in the right middle lobe concerning for pneumonia with recommendation for follow-up.  The patient was given IV lactated Ringer bolus, 1 g of p.o. Tylenol as well as IV vancomycin and IV cefepime.  She will be admitted to a progressive unit bed for further evaluation and management. PAST MEDICAL HISTORY:   Past Medical History:  Diagnosis Date  . Diabetes mellitus without complication (HCC)   . GERD (gastroesophageal reflux disease)   . Herpes zoster   . History of chicken pox   . Hyperlipidemia   . Hypertension    pt denies.  meds are for kidney protection  . Osteoarthrosis   . Wears dentures    lower partial    PAST SURGICAL HISTORY:   Past Surgical History:  Procedure Laterality Date  . APPENDECTOMY    . BACK SURGERY    . CATARACT EXTRACTION W/PHACO Right 09/09/2018   Procedure: CATARACT EXTRACTION PHACO AND INTRAOCULAR LENS PLACEMENT (IOC)  RIGHT DIABETIC  01:01.5  16.2%  9.99;  Surgeon: Lockie Mola, MD;  Location: [facility];  Service: Ophthalmology;  Laterality: Right;  Diabetic - oral meds  . COLONOSCOPY WITH PROPOFOL N/A 08/21/2015   Procedure: COLONOSCOPY WITH PROPOFOL;  Surgeon: Christena Deem, MD;  Location: [facility] END[facility];  Service: Endoscopy;  Laterality: N/A;  . ESOPHAGOGASTRODUODENOSCOPY (EGD) WITH PROPOFOL N/A 06/21/2020   Procedure: ESOPHAGOGASTRODUODENOSCOPY (EGD) WITH PROPOFOL;  Surgeon: Toledo, Boykin Nearing, MD;  Location: [facility] ENDOSCOPY;  Service: Gastroenterology;  Laterality: N/A;  . JOINT REPLACEMENT Right    knee  . TONSILLECTOMY    . TUBAL LIGATION      SOCIAL HISTORY:   Social History   Tobacco Use  . Smoking status: Former    Packs/day: 1.00    Years: 20.00    Pack years: 20.00    Types: Cigarettes    Quit date: 1999    Years since quitting: 23.4  . Smokeless tobacco: Never  Substance Use Topics  . Alcohol use: Yes    Comment: may have  1-2 drinks/month    FAMILY HISTORY:   Family History  Problem Relation Age of Onset  . Breast cancer Cousin     DRUG ALLERGIES:   Allergies  Allergen Reactions  . Adhesive [Tape] Rash    With extended use    REVIEW OF SYSTEMS:   ROS As per history of present illness. All pertinent systems were reviewed above. Constitutional, HEENT, cardiovascular, respiratory, GI, GU, musculoskeletal, neuro, psychiatric, endocrine, integumentary and hematologic systems were reviewed and are otherwise negative/unremarkable except for positive findings mentioned above in the HPI.   MEDICATIONS AT HOME:   Prior to Admission medications   Medication Sig Start Date End Date Taking? Authorizing Provider  Calcium-Vitamin D (CALTRATE 600 PLUS-VIT D PO) Take 1 tablet by mouth 2 (two) times daily.   Yes [provider]  glipiZIDE (GLUCOTROL XL) 5 MG 24 hr tablet Take 5 mg by mouth daily with breakfast.   Yes [provider]  losartan (COZAAR) 50 MG tablet Take 50 mg by mouth daily.   Yes [provider]  metFORMIN (GLUCOPHAGE) 500 MG tablet Take by mouth 2 (two) times daily with a meal.   Yes [provider]  montelukast (SINGULAIR) 10 MG tablet Take 10 mg by mouth daily as needed.   Yes [provider]  MULTIPLE VITAMIN PO Take 1 tablet by mouth daily.   Yes [provider]  MUPIROCIN EX Apply topically.   Yes [provider]  pantoprazole (PROTONIX) 40 MG tablet Take 1 tablet (40 mg total) by mouth daily. 05/23/20 05/23/21 Yes Paduchowski, Caryn BeeKevin, MD  progesterone (PROMETRIUM) 100 MG capsule Take 100 mg by mouth daily.   Yes [provider]  psyllium (METAMUCIL) 58.6 % packet Take 1 packet by mouth daily.   Yes [provider]  simvastatin (ZOCOR) 20 MG tablet Take 20 mg by mouth daily.   Yes [provider]  sitaGLIPtin (JANUVIA) 100 MG tablet Take 100 mg by mouth daily.   Yes [provider]  tolterodine  (DETROL LA) 4 MG 24 hr capsule Take 2 mg by mouth 2 (two) times daily.    Yes [provider]  venlafaxine (EFFEXOR) 75 MG tablet Take 75 mg by mouth daily.   Yes [provider]  albuterol (VENTOLIN HFA) 108 (90 Base) MCG/ACT inhaler Inhale 1-2 puffs into the lungs every 6 (six) hours as needed for wheezing or shortness of breath. Patient not taking: No sig reported    [provider]  azelastine (OPTIVAR) 0.05 % ophthalmic solution 1 drop 2 (two) times daily. Patient not taking: No sig reported    [provider]  CEPHALEXIN PO Take by mouth. Patient not taking: No sig reported    [provider]  ondansetron (ZOFRAN ODT) 4 MG disintegrating tablet Take 1 tablet (4 mg total) by mouth every 8 (eight) hours as needed for nausea or vomiting. 05/23/20   Minna AntisPaduchowski, Kevin, MD  VITAL SIGNS:  Blood pressure 128/70, pulse (!) 112, temperature 100.3 F (37.9 C), temperature source Oral, resp. rate 20, SpO2 93 %.  PHYSICAL EXAMINATION:  Physical Exam  GENERAL:  75 y.o.-year-old patient lying in the bed with no acute distress.  EYES: Pupils equal, round, reactive to light and accommodation. No scleral icterus. Extraocular muscles intact.  HEENT: Head atraumatic, normocephalic. Oropharynx and nasopharynx clear.  NECK:  Supple, no jugular venous distention. No thyroid enlargement, no tenderness.  LUNGS: Normal breath sounds bilaterally, no wheezing, rales,rhonchi or crepitation. No use of accessory muscles of respiration.  CARDIOVASCULAR: Regular rate and rhythm, S1, S2 normal. No murmurs, rubs, or gallops.  ABDOMEN: Soft, nondistended, nontender. Bowel sounds present. No organomegaly or mass.  EXTREMITIES: No pedal edema, cyanosis, or clubbing.  NEUROLOGIC: Cranial nerves II through XII are intact. Muscle strength 5/5 in all extremities. Sensation intact. Gait not checked.  PSYCHIATRIC: The patient is alert and oriented x 3.  Normal affect and good  eye contact. SKIN: No obvious rash, lesion, or ulcer.   LABORATORY PANEL:   CBC Recent Labs  Lab 06/26/20 2317  WBC 12.9*  HGB 14.2  HCT 41.3  PLT 191   ------------------------------------------------------------------------------------------------------------------  Chemistries  Recent Labs  Lab 06/26/20 2317  NA 135  K 4.1  CL 103  CO2 23  GLUCOSE 258*  BUN 18  CREATININE 0.89  CALCIUM 9.1  AST 22  ALT 13  ALKPHOS 46  BILITOT 0.6   ------------------------------------------------------------------------------------------------------------------  Cardiac Enzymes No results for input(s): TROPONINI in the last 168 hours. ------------------------------------------------------------------------------------------------------------------  RADIOLOGY:  CT Angio Chest PE W and/or Wo Contrast  Result Date: 06/27/2020 CLINICAL DATA:  Chest pain.  Shortness of breath.  Cough and fever. EXAM: CT ANGIOGRAPHY CHEST WITH CONTRAST TECHNIQUE: Multidetector CT imaging of the chest was performed using the standard protocol during bolus administration of intravenous contrast. Multiplanar CT image reconstructions and MIPs were obtained to evaluate the vascular anatomy. CONTRAST:  84mL OMNIPAQUE IOHEXOL 350 MG/ML SOLN COMPARISON:  None. FINDINGS: Cardiovascular: Satisfactory opacification of the pulmonary arteries to the segmental level. Motion artifact diminishes exam detail. There is a single tiny (3 mm) filling defect within the right lower lobar pulmonary artery, equivocal for clinically significant pulmonary embolus, image 53/4. Heart size is within normal limits. No pericardial effusion. Aortic atherosclerosis. Lad coronary artery calcifications. Mediastinum/Nodes: Normal appearance of the thyroid gland. The trachea appears patent and is midline. Normal appearance of the esophagus. No enlarged lymph nodes Lungs/Pleura: Rounded airspace consolidation with surrounding ground-glass  attenuation is noted within the right middle lobe measuring approximately 3.8 cm. No pleural effusion or edema. 4 mm perifissural nodule is identified. Upper Abdomen: No acute abnormality Musculoskeletal: No chest wall abnormality. No acute or significant osseous findings. Review of the MIP images confirms the above findings. IMPRESSION: 1. Motion artifact diminishes exam detail. 2. There is a single tiny (3 mm) filling defect within the right lower lobar pulmonary artery, equivocal for clinically significant pulmonary embolus. 3. Rounded airspace consolidation with surrounding ground-glass attenuation is identified within the right middle lobe. Findings are favored to represent an area of pneumonia. Follow-up imaging is advised to ensure resolution and exclude underlying malignancy. 4. Coronary artery calcifications noted. 5. Aortic atherosclerosis. Aortic Atherosclerosis (ICD10-I70.0). Electronically Signed   By: Signa Kell M.D.   On: 06/27/2020 01:42   DG Chest Port 1 View  Result Date: 06/26/2020 CLINICAL DATA:  Fever. EXAM: PORTABLE CHEST 1 VIEW COMPARISON:  Chest x-ray 05/08/2011 FINDINGS: The heart size and  mediastinal contours are unchanged. Aortic calcification. Interval development of a 4 x 3 cm density at the right costocardiac angle of unclear etiology. No pulmonary edema. No pleural effusion. No pneumothorax. No acute osseous abnormality. IMPRESSION: Interval development of a 4 x 3 cm density at the right costocardiac angle of unclear etiology. Recommend CT chest with intravenous contrast for further evaluation. Electronically Signed   By: Tish Frederickson M.D.   On: 06/26/2020 23:37      IMPRESSION AND PLAN:  Active Problems:   Sepsis due to pneumonia (HCC)  1.  Sepsis secondary to pneumonia, possibly bacterial with differential diagnosis including aspiration pneumonia given recent procedure.  Sepsis is manifested by fever, tachypnea, tachycardia and leukocytosis.  The patient meets  criteria for severe sepsis given lactic acid of 2.5 - The patient will be admitted to a progressive unit bed. - We will continue antibiotic therapy with IV Rocephin and Zithromax. - Mucolytic therapy will be provided. - We will follow blood and sputum culture. - Urine pneumonia antigens will be obtained. - The patient will be hydrated with IV normal saline. - We will follow lactic acid level.  2.  Suspected acute pulmonary embolism with a small 3 mm right lower lobe embolus with pleuritic chest pain. - The patient will be placed on p.o. Eliquis for now. - Pain management with as needed IV Toradol.  3.  Type 2 diabetes mellitus with hyperglycemia. - The patient will be placed on supplement coverage with NovoLog. - We will continue glipizide, Januvia and hold off metformin.  4.  Essential hypertension. - We will continue Cozaar.  5.  Depression. - We will continue Effexor XR.  6.  Overactive bladder. - We will continue Detrol LA.  7.  Dyslipidemia. - We will continue statin therapy.  8.  Recent acute gastritis. - We will place the patient on PPI therapy.   DVT prophylaxis: Eliquis. Code Status: full code. Family Communication:  The plan of care was discussed in details with the patient (and her husband who was with her in the room). I answered all questions. The patient agreed to proceed with the above mentioned plan. Further management will depend upon hospital course. Disposition Plan: Back to previous home environment Consults called: none. All the records are reviewed and case discussed with ED provider.  Status is: Inpatient  Remains inpatient appropriate because:Altered mental status, Ongoing diagnostic testing needed not appropriate for outpatient work up, Unsafe d/c plan, IV treatments appropriate due to intensity of illness or inability to take PO, and Inpatient level of care appropriate due to severity of illness  Dispo: The patient is from: Home               Anticipated d/c is to: Home              Patient currently is not medically stable to d/c.   Difficult to place patient No   TOTAL TIME TAKING CARE OF THIS PATIENT: 55 minutes.    Hannah Beat M.D on 06/27/2020 at 2:29 AM  [facility]   From 7 PM-7 AM, contact night-coverage [email or web address]  CC: Primary care physician; Kandyce Rud, MD to ensure resolution and exclude malignancy.  It showed coronary artery calcifications and aortic atherosclerosis.

## 2020-06-27 NOTE — ED Notes (Signed)
Ate banana and coffee for breakfast.

## 2020-06-27 NOTE — Progress Notes (Signed)
[facility]link following for sepsis protocol. 

## 2020-06-28 ENCOUNTER — Encounter: Payer: Self-pay | Admitting: Family Medicine

## 2020-06-28 DIAGNOSIS — R7881 Bacteremia: Secondary | ICD-10-CM

## 2020-06-28 DIAGNOSIS — G9340 Encephalopathy, unspecified: Secondary | ICD-10-CM

## 2020-06-28 DIAGNOSIS — A4150 Gram-negative sepsis, unspecified: Secondary | ICD-10-CM

## 2020-06-28 DIAGNOSIS — J181 Lobar pneumonia, unspecified organism: Secondary | ICD-10-CM

## 2020-06-28 DIAGNOSIS — J189 Pneumonia, unspecified organism: Secondary | ICD-10-CM

## 2020-06-28 LAB — CBC
HCT: 36 % (ref 36.0–46.0)
Hemoglobin: 12.3 g/dL (ref 12.0–15.0)
MCH: 30.3 pg (ref 26.0–34.0)
MCHC: 34.2 g/dL (ref 30.0–36.0)
MCV: 88.7 fL (ref 80.0–100.0)
Platelets: 167 10*3/uL (ref 150–400)
RBC: 4.06 MIL/uL (ref 3.87–5.11)
RDW: 12.2 % (ref 11.5–15.5)
WBC: 9.1 10*3/uL (ref 4.0–10.5)
nRBC: 0 % (ref 0.0–0.2)

## 2020-06-28 LAB — GLUCOSE, CAPILLARY
Glucose-Capillary: 200 mg/dL — ABNORMAL HIGH (ref 70–99)
Glucose-Capillary: 290 mg/dL — ABNORMAL HIGH (ref 70–99)
Glucose-Capillary: 158 mg/dL — ABNORMAL HIGH (ref 70–99)
Glucose-Capillary: 206 mg/dL — ABNORMAL HIGH (ref 70–99)

## 2020-06-28 LAB — BASIC METABOLIC PANEL
Anion gap: 4 — ABNORMAL LOW (ref 5–15)
BUN: 9 mg/dL (ref 8–23)
CO2: 28 mmol/L (ref 22–32)
Calcium: 8.5 mg/dL — ABNORMAL LOW (ref 8.9–10.3)
Chloride: 102 mmol/L (ref 98–111)
Creatinine, Ser: 0.76 mg/dL (ref 0.44–1.00)
GFR, Estimated: >60 mL/min (ref >60)
Glucose, Bld: 193 mg/dL — ABNORMAL HIGH (ref 70–99)
Potassium: 3.2 mmol/L — ABNORMAL LOW (ref 3.5–5.1)
Sodium: 134 mmol/L — ABNORMAL LOW (ref 135–145)

## 2020-06-28 LAB — URINE CULTURE: Culture: NO GROWTH

## 2020-06-28 LAB — HEMOGLOBIN A1C
Hgb A1c MFr Bld: 7.9 % — ABNORMAL HIGH (ref 4.8–5.6)
Mean Plasma Glucose: 180 mg/dL

## 2020-06-28 LAB — PROCALCITONIN: Procalcitonin: 0.83 ng/mL

## 2020-06-28 LAB — MAGNESIUM: Magnesium: 1.6 mg/dL — ABNORMAL LOW (ref 1.7–2.4)

## 2020-06-28 LAB — PHOSPHORUS: Phosphorus: 3.4 mg/dL (ref 2.5–4.6)

## 2020-06-28 MED ORDER — SODIUM CHLORIDE 0.9 % IV SOLN: INTRAVENOUS | Status: DC

## 2020-06-28 MED ORDER — MAGNESIUM SULFATE 2 GM/50ML IV SOLN
2.0000 g | Freq: Once | INTRAVENOUS | Status: AC
  Administered 2020-06-28: 2 g via INTRAVENOUS
  Filled 2020-06-28: qty 50

## 2020-06-28 MED ORDER — IPRATROPIUM-ALBUTEROL 0.5-2.5 (3) MG/3ML IN SOLN
3.0000 mL | Freq: Three times a day (TID) | RESPIRATORY_TRACT | Status: DC
  Administered 2020-06-28 – 2020-06-29 (×3): 3 mL via RESPIRATORY_TRACT
  Filled 2020-06-28 (×3): qty 3

## 2020-06-28 MED ORDER — POTASSIUM CHLORIDE CRYS ER 20 MEQ PO TBCR
40.0000 meq | EXTENDED_RELEASE_TABLET | Freq: Once | ORAL | Status: AC
  Administered 2020-06-28: 40 meq via ORAL
  Filled 2020-06-28: qty 2

## 2020-06-28 MED ORDER — INSULIN GLARGINE 100 UNIT/ML ~~LOC~~ SOLN
15.0000 [IU] | Freq: Every day | SUBCUTANEOUS | Status: DC
  Administered 2020-06-29 – 2020-07-01 (×3): 15 [IU] via SUBCUTANEOUS
  Filled 2020-06-28 (×4): qty 0.15

## 2020-06-28 MED ORDER — SODIUM CHLORIDE 0.9 % IV SOLN
3.0000 g | Freq: Four times a day (QID) | INTRAVENOUS | Status: AC
Start: 2020-06-28 — End: 2020-07-01
  Administered 2020-06-28 – 2020-07-01 (×11): 3 g via INTRAVENOUS
  Filled 2020-06-28 (×4): qty 3
  Filled 2020-06-28: qty 8
  Filled 2020-06-28 (×7): qty 3

## 2020-06-28 MED ORDER — POTASSIUM CHLORIDE CRYS ER 20 MEQ PO TBCR
40.0000 meq | EXTENDED_RELEASE_TABLET | Freq: Once | ORAL | Status: AC
  Administered 2020-06-28: 40 meq via ORAL
  Filled 2020-06-28 (×2): qty 2

## 2020-06-28 NOTE — Progress Notes (Addendum)
Progress Note    Madison Cuevas  OVF:[id number] DOB: 10-06-1945  DOA: 06/26/2020 PCP: Kandyce Rud, MD      Brief Narrative:    Medical records reviewed and are as summarized below:  DERENDA Cuevas is a 75 y.o. female with medical history significant for type II DM, hypertension, CAD, dyslipidemia, recent EGD about a week prior to admission which showed acute gastritis.  She presented to the hospital with productive cough, wheezing and fever.  She was admitted to the hospital for sepsis secondary to pneumonia.  She was treated with empiric IV antibiotics and IV fluids.  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  CTA also showed acute pulmonary embolism    Assessment/Plan:   Active Problems:   Sepsis due to pneumonia (HCC)    Body mass index is 31.32 kg/m.  (Obesity)   Sepsis secondary to community-acquired pneumonia, Pasteurella multocida bacteremia: Continue empiric IV antibiotics.  Consulted ID to assist with management.  No vegetations on 2D echo.  EF was estimated at 60 to 65%.  Grade 1 diastolic dysfunction, mild to moderate mitral stenosis.  ID recommended TEE.  Consulted cardiologist for TEE.  Acute pulmonary embolism: Continue Eliquis.  Obtain venous duplex of the lower extremities to check for DVT  Hypokalemia and hypomagnesemia: Replete potassium and magnesium respectively.  Monitor levels.  Type II DM with hyperglycemia: Start Lantus 15 units daily.  NovoLog as needed for hyperglycemia.  Home glipizide and Sitagliptin have been held.  Acute gastritis: Continue Protonix  Other comorbidities include depression, hypertension, hyperlipidemia, overactive bladder  Diet Order             Diet Carb Modified Fluid consistency: Thin; Room service appropriate? Yes  Diet effective now                      Consultants: Infectious disease  Procedures: None    Medications:    apixaban  10 mg Oral BID   [START ON 07/04/2020]  apixaban  5 mg Oral BID   fesoterodine  4 mg Oral Daily   guaiFENesin  600 mg Oral BID   insulin aspart  0-15 Units Subcutaneous TID AC & HS   ipratropium-albuterol  3 mL Nebulization TID   losartan  50 mg Oral Daily   multivitamin with minerals  1 tablet Oral Daily   pantoprazole  40 mg Oral Daily   progesterone  100 mg Oral Daily   psyllium  1 packet Oral Daily   simvastatin  20 mg Oral Daily   venlafaxine XR  75 mg Oral Daily   Continuous Infusions:  ampicillin-sulbactam (UNASYN) IV     azithromycin Stopped (06/28/20 0240)     Anti-infectives (From admission, onward)    Start     Dose/Rate Route Frequency Ordered Stop   06/28/20 1500  Ampicillin-Sulbactam (UNASYN) 3 g in sodium chloride 0.9 % 100 mL IVPB        3 g 200 mL/hr over 30 Minutes Intravenous Every 6 hours 06/28/20 1358     06/27/20 1400  piperacillin-tazobactam (ZOSYN) IVPB 3.375 g  Status:  Discontinued        3.375 g 12.5 mL/hr over 240 Minutes Intravenous Every 8 hours 06/27/20 1226 06/28/20 1358   06/27/20 0700  cefTRIAXone (ROCEPHIN) 2 g in sodium chloride 0.9 % 100 mL IVPB  Status:  Discontinued        2 g 200 mL/hr over 30 Minutes Intravenous Every 24 hours 06/27/20  0234 06/27/20 1226   06/27/20 0245  azithromycin (ZITHROMAX) 500 mg in sodium chloride 0.9 % 250 mL IVPB        500 mg 250 mL/hr over 60 Minutes Intravenous Every 24 hours 06/27/20 0234 07/02/20 0244   06/27/20 0100  vancomycin (VANCOCIN) IVPB 1000 mg/200 mL premix        1,000 mg 200 mL/hr over 60 Minutes Intravenous  Once 06/27/20 0050 06/27/20 0451   06/27/20 0045  vancomycin (VANCOREADY) IVPB 2000 mg/400 mL  Status:  Discontinued        2,000 mg 200 mL/hr over 120 Minutes Intravenous  Once 06/27/20 0044 06/27/20 0050   06/27/20 0030  vancomycin (VANCOCIN) IVPB 1000 mg/200 mL premix  Status:  Discontinued        1,000 mg 200 mL/hr over 60 Minutes Intravenous  Once 06/27/20 0025 06/27/20 0312   06/27/20 0030  ceFEPIme (MAXIPIME) 2 g in  sodium chloride 0.9 % 100 mL IVPB        2 g 200 mL/hr over 30 Minutes Intravenous  Once 06/27/20 0025 06/27/20 0132              Family Communication/Anticipated D/C date and plan/Code Status   DVT prophylaxis:  apixaban (ELIQUIS) tablet 10 mg  apixaban (ELIQUIS) tablet 5 mg     Code Status: Full Code  Family Communication: Husband at the bedside Disposition Plan:    Status is: Inpatient  Remains inpatient appropriate because:IV treatments appropriate due to intensity of illness or inability to take PO and Inpatient level of care appropriate due to severity of illness  Dispo: The patient is from: Home              Anticipated d/c is to: Home              Patient currently is not medically stable to d/c.   Difficult to place patient No           Subjective:   Interval events noted. C/o cough. No shortness of breath or chest pain.  Objective:    Vitals:   06/28/20 0400 06/28/20 0500 06/28/20 0745 06/28/20 1200  BP: 128/68  (!) 153/76 131/71  Pulse: (!) 103  (!) 101 95  Resp: 18  16   Temp: 99.1 F (37.3 C)  98.9 F (37.2 C)   TempSrc: Oral  Oral   SpO2: 95%  94% 95%  Weight:  90.7 kg    Height:       No data found.   Intake/Output Summary (Last 24 hours) at 06/28/2020 1409 Last data filed at 06/28/2020 1354 Gross per 24 hour  Intake 951.84 ml  Output 1200 ml  Net -248.16 ml   Filed Weights   06/27/20 2341 06/28/20 0500  Weight: 90.9 kg 90.7 kg    Exam:  GEN: NAD SKIN: No rash EYES: EOMI ENT: MMM CV: RRR PULM: CTA B ABD: soft, ND, NT, +BS CNS: AAO x 3, non focal EXT: No edema or tenderness        Data Reviewed:   I have personally reviewed following labs and imaging studies:  Labs: Labs show the following:   Basic Metabolic Panel: Recent Labs  Lab 06/26/20 2317 06/27/20 0504 06/28/20 0435  NA 135 137 134*  K 4.1 4.1 3.2*  CL 103 103 102  CO2 23 29 28   GLUCOSE 258* 224* 193*  BUN 18 13 9   CREATININE 0.89  0.85 0.76  CALCIUM 9.1 8.5* 8.5*  MG  --   --  1.6*  PHOS  --   --  3.4   GFR Estimated Creatinine Clearance: 69.1 mL/min (by C-G formula based on SCr of 0.76 mg/dL). Liver Function Tests: Recent Labs  Lab 06/26/20 2317  AST 22  ALT 13  ALKPHOS 46  BILITOT 0.6  PROT 7.4  ALBUMIN 4.3   No results for input(s): LIPASE, AMYLASE in the last 168 hours. No results for input(s): AMMONIA in the last 168 hours. Coagulation profile Recent Labs  Lab 06/26/20 2317  INR 1.0    CBC: Recent Labs  Lab 06/26/20 2317 06/27/20 0504 06/28/20 0435  WBC 12.9* 12.1* 9.1  NEUTROABS 11.1*  --   --   HGB 14.2 12.8 12.3  HCT 41.3 37.6 36.0  MCV 88.1 87.9 88.7  PLT 191 171 167   Cardiac Enzymes: No results for input(s): CKTOTAL, CKMB, CKMBINDEX, TROPONINI in the last 168 hours. BNP (last 3 results) No results for input(s): PROBNP in the last 8760 hours. CBG: Recent Labs  Lab 06/27/20 1408 06/27/20 1756 06/27/20 2225 06/28/20 0731 06/28/20 1134  GLUCAP 168* 243* 174* 200* 290*   D-Dimer: No results for input(s): DDIMER in the last 72 hours. Hgb A1c: Recent Labs    06/27/20 0504  HGBA1C 7.9*   Lipid Profile: No results for input(s): CHOL, HDL, LDLCALC, TRIG, CHOLHDL, LDLDIRECT in the last 72 hours. Thyroid function studies: No results for input(s): TSH, T4TOTAL, T3FREE, THYROIDAB in the last 72 hours.  Invalid input(s): FREET3 Anemia work up: No results for input(s): VITAMINB12, FOLATE, FERRITIN, TIBC, IRON, RETICCTPCT in the last 72 hours. Sepsis Labs: Recent Labs  Lab 06/26/20 2317 06/27/20 0200 06/27/20 0504 06/28/20 0435  PROCALCITON <0.10  --  0.82 0.83  WBC 12.9*  --  12.1* 9.1  LATICACIDVEN 2.5* 1.7  --   --     Microbiology Recent Results (from the past 240 hour(s))  Culture, blood (Routine x 2)     Status: Abnormal (Preliminary result)   Collection Time: 06/26/20 11:17 PM   Specimen: BLOOD  Result Value Ref Range Status   Specimen Description   Final     BLOOD RIGHT FOREARM Performed at [facility], [address], [place], [place] [address]    Special Requests   Final    BOTTLES DRAWN AEROBIC AND ANAEROBIC Blood Culture results may not be optimal due to an excessive volume of blood received in culture bottles Performed at [facility], [address] [place], [place] [address]    Culture  Setup Time   Final    GRAM NEGATIVE RODS IN BOTH AEROBIC AND ANAEROBIC BOTTLES CRITICAL VALUE NOTED.  VALUE IS CONSISTENT WITH PREVIOUSLY REPORTED AND CALLED VALUE. Performed at [facility], [address] [place], [place] [address]    Culture (A)  Final    PASTEURELLA MULTOCIDA Usually susceptible to penicillin and other beta lactam agents,quinolones,macrolides and tetracyclines. Performed at [facility], [address] [place], [place] [address]    Report Status PENDING  Incomplete  Resp Panel by RT-PCR (Flu A&B, Covid) Nasopharyngeal Swab     Status: None   Collection Time: 06/26/20 11:18 PM   Specimen: Nasopharyngeal Swab; Nasopharyngeal(NP) swabs in vial transport medium  Result Value Ref Range Status   SARS Coronavirus 2 by RT PCR NEGATIVE NEGATIVE Final    Comment: (NOTE) SARS-CoV-2 target nucleic acids are NOT DETECTED.  The SARS-CoV-2 RNA is generally detectable in upper respiratory specimens during the acute phase of infection. The lowest concentration of  SARS-CoV-2 viral copies this assay can detect is 138 copies/mL. A negative result does not preclude SARS-Cov-2 infection and should not be used as the sole basis for treatment or other patient management decisions. A negative result may occur with  improper specimen collection/handling, submission of specimen other than nasopharyngeal swab, presence of viral mutation(s) within the areas targeted by this assay, and inadequate number of viral copies(<138 copies/mL). A negative result must be combined with clinical observations, patient  history, and epidemiological information. The expected result is Negative.  Fact Sheet for Patients:  BloggerCourse.comhttps://www.fda.gov/media/152166/download  Fact Sheet for Healthcare Providers:  SeriousBroker.ithttps://www.fda.gov/media/152162/download  This test is no t yet approved or cleared by the [place] FDA and  has been authorized for detection and/or diagnosis of SARS-CoV-2 by FDA under an Emergency Use Authorization (EUA). This EUA will remain  in effect (meaning this test can be used) for the duration of the COVID-19 declaration under Section 564(b)(1) of the Act, 21 U.S.C.section 360bbb-3(b)(1), unless the authorization is terminated  or revoked sooner.       Influenza A by PCR NEGATIVE NEGATIVE Final   Influenza B by PCR NEGATIVE NEGATIVE Final    Comment: (NOTE) The Xpert Xpress SARS-CoV-2/FLU/RSV plus assay is intended as an aid in the diagnosis of influenza from Nasopharyngeal swab specimens and should not be used as a sole basis for treatment. Nasal washings and aspirates are unacceptable for Xpert Xpress SARS-CoV-2/FLU/RSV testing.  Fact Sheet for Patients: BloggerCourse.comhttps://www.fda.gov/media/152166/download  Fact Sheet for Healthcare Providers: SeriousBroker.ithttps://www.fda.gov/media/152162/download  This test is not yet approved or cleared by the [place] FDA and has been authorized for detection and/or diagnosis of SARS-CoV-2 by FDA under an Emergency Use Authorization (EUA). This EUA will remain in effect (meaning this test can be used) for the duration of the COVID-19 declaration under Section 564(b)(1) of the Act, 21 U.S.C. section 360bbb-3(b)(1), unless the authorization is terminated or revoked.  Performed at [facility], [address] [place], [place] [address]   Culture, blood (Routine x 2)     Status: Abnormal (Preliminary result)   Collection Time: 06/27/20 12:25 AM   Specimen: BLOOD  Result Value Ref Range Status   Specimen Description   Final    BLOOD LEFT  ANTECUBITAL Performed at [facility], [address] [place], [place] [address]    Special Requests   Final    BOTTLES DRAWN AEROBIC AND ANAEROBIC Blood Culture adequate volume Performed at [facility], [address] [place], [place] [address]    Culture  Setup Time   Final    GRAM NEGATIVE RODS IN BOTH AEROBIC AND ANAEROBIC BOTTLES CRITICAL RESULT CALLED TO, READ BACK BY AND VERIFIED WITH: DEVIN MITCHELL, PHARMD AT 1134 ON 06/27/20 BY GM    Culture (A)  Final    PASTEURELLA MULTOCIDA Usually susceptible to penicillin and other beta lactam agents,quinolones,macrolides and tetracyclines. Performed at [facility], [address] [place], [place] [address]    Report Status PENDING  Incomplete  Blood Culture ID Panel (Reflexed)     Status: None   Collection Time: 06/27/20 12:25 AM  Result Value Ref Range Status   Enterococcus faecalis NOT DETECTED NOT DETECTED Final   Enterococcus Faecium NOT DETECTED NOT DETECTED Final   Listeria monocytogenes NOT DETECTED NOT DETECTED Final   Staphylococcus species NOT DETECTED NOT DETECTED Final   Staphylococcus aureus (BCID) NOT DETECTED NOT DETECTED Final   Staphylococcus epidermidis NOT DETECTED NOT DETECTED Final   Staphylococcus lugdunensis NOT  DETECTED NOT DETECTED Final   Streptococcus species NOT DETECTED NOT DETECTED Final   Streptococcus agalactiae NOT DETECTED NOT DETECTED Final   Streptococcus pneumoniae NOT DETECTED NOT DETECTED Final   Streptococcus pyogenes NOT DETECTED NOT DETECTED Final   A.calcoaceticus-baumannii NOT DETECTED NOT DETECTED Final   Bacteroides fragilis NOT DETECTED NOT DETECTED Final   Enterobacterales NOT DETECTED NOT DETECTED Final   Enterobacter cloacae complex NOT DETECTED NOT DETECTED Final   Escherichia coli NOT DETECTED NOT DETECTED Final   Klebsiella aerogenes NOT DETECTED NOT DETECTED Final   Klebsiella oxytoca NOT DETECTED NOT DETECTED Final   Klebsiella pneumoniae NOT  DETECTED NOT DETECTED Final   Proteus species NOT DETECTED NOT DETECTED Final   Salmonella species NOT DETECTED NOT DETECTED Final   Serratia marcescens NOT DETECTED NOT DETECTED Final   Haemophilus influenzae NOT DETECTED NOT DETECTED Final   Neisseria meningitidis NOT DETECTED NOT DETECTED Final   Pseudomonas aeruginosa NOT DETECTED NOT DETECTED Final   Stenotrophomonas maltophilia NOT DETECTED NOT DETECTED Final   Candida albicans NOT DETECTED NOT DETECTED Final   Candida auris NOT DETECTED NOT DETECTED Final   Candida glabrata NOT DETECTED NOT DETECTED Final   Candida krusei NOT DETECTED NOT DETECTED Final   Candida parapsilosis NOT DETECTED NOT DETECTED Final   Candida tropicalis NOT DETECTED NOT DETECTED Final   Cryptococcus neoformans/gattii NOT DETECTED NOT DETECTED Final    Comment: Performed at [facility], [address], [place], [place] [address]  Urine Culture     Status: None   Collection Time: 06/27/20  1:41 AM   Specimen: Urine, Catheterized  Result Value Ref Range Status   Specimen Description   Final    URINE, CATHETERIZED Performed at [facility], [address], [place], [place] [address]    Special Requests   Final    NONE Performed at [facility], [address] [place], [place] [address]    Culture   Final    NO GROWTH Performed at [facility], [address], [place], [place] [address]    Report Status 06/28/2020 FINAL  Final    Procedures and diagnostic studies:  CT Angio Chest PE W and/or Wo Contrast  Result Date: 06/27/2020 CLINICAL DATA:  Chest pain.  Shortness of breath.  Cough and fever. EXAM: CT ANGIOGRAPHY CHEST WITH CONTRAST TECHNIQUE: Multidetector CT imaging of the chest was performed using the standard protocol during bolus administration of intravenous contrast. Multiplanar CT image reconstructions and MIPs were obtained to evaluate the vascular anatomy. CONTRAST:  31mL OMNIPAQUE IOHEXOL 350 MG/ML  SOLN COMPARISON:  None. FINDINGS: Cardiovascular: Satisfactory opacification of the pulmonary arteries to the segmental level. Motion artifact diminishes exam detail. There is a single tiny (3 mm) filling defect within the right lower lobar pulmonary artery, equivocal for clinically significant pulmonary embolus, image 53/4. Heart size is within normal limits. No pericardial effusion. Aortic atherosclerosis. Lad coronary artery calcifications. Mediastinum/Nodes: Normal appearance of the thyroid gland. The trachea appears patent and is midline. Normal appearance of the esophagus. No enlarged lymph nodes Lungs/Pleura: Rounded airspace consolidation with surrounding ground-glass attenuation is noted within the right middle lobe measuring approximately 3.8 cm. No pleural effusion or edema. 4 mm perifissural nodule is identified. Upper Abdomen: No acute abnormality Musculoskeletal: No chest wall abnormality. No acute or significant osseous findings. Review of the MIP images confirms the above findings. IMPRESSION: 1. Motion artifact diminishes exam detail. 2. There is a single tiny (3 mm) filling defect within the  right lower lobar pulmonary artery, equivocal for clinically significant pulmonary embolus. 3. Rounded airspace consolidation with surrounding ground-glass attenuation is identified within the right middle lobe. Findings are favored to represent an area of pneumonia. Follow-up imaging is advised to ensure resolution and exclude underlying malignancy. 4. Coronary artery calcifications noted. 5. Aortic atherosclerosis. Aortic Atherosclerosis (ICD10-I70.0). Electronically Signed   By: Signa Kell M.D.   On: 06/27/2020 01:42   DG Chest Port 1 View  Result Date: 06/26/2020 CLINICAL DATA:  Fever. EXAM: PORTABLE CHEST 1 VIEW COMPARISON:  Chest x-ray 05/08/2011 FINDINGS: The heart size and mediastinal contours are unchanged. Aortic calcification. Interval development of a 4 x 3 cm density at the right  costocardiac angle of unclear etiology. No pulmonary edema. No pleural effusion. No pneumothorax. No acute osseous abnormality. IMPRESSION: Interval development of a 4 x 3 cm density at the right costocardiac angle of unclear etiology. Recommend CT chest with intravenous contrast for further evaluation. Electronically Signed   By: Tish Frederickson M.D.   On: 06/26/2020 23:37   ECHOCARDIOGRAM COMPLETE  Result Date: 06/27/2020    ECHOCARDIOGRAM REPORT   Patient Name:   SEONA CLEMENSON Date of Exam: 06/27/2020 Medical Rec #:  [id number]      Height:       67.0 in Accession #:    [id number]     Weight:       198.0 lb Date of Birth:  02/25/1945      BSA:          2.014 m Patient Age:    75 years       BP:           144/61 mmHg Patient Gender: F              HR:           110 bpm. Exam Location:  [facility] Procedure: 2D Echo, Color Doppler and Cardiac Doppler Indications:     I26.09 Pulmonary Embolus  History:         Patient has no prior history of Echocardiogram examinations.                  Signs/Symptoms:Fever and Cough; Risk Factors:Hypertension,                  Diabetes and Dyslipidemia.  Sonographer:     Humphrey Rolls RDCS (AE) Referring Phys:  [id number] Vernetta Honey MANSY Diagnosing Phys: Yvonne Kendall MD  Sonographer Comments: Global longitudinal strain was attempted. IMPRESSIONS  1. Left ventricular ejection fraction, by estimation, is 60 to 65%. The left ventricle has normal function. Left ventricular endocardial border not optimally defined to evaluate regional wall motion. Left ventricular diastolic parameters are consistent with Grade I diastolic dysfunction (impaired relaxation). The average left ventricular global longitudinal strain is -14.2 %. The global longitudinal strain is abnormal.  2. Right ventricular systolic function is normal. The right ventricular size is normal.  3. The mitral valve is abnormal. Trivial mitral valve regurgitation. Mild to moderate mitral stenosis. The mean mitral valve gradient is 6.0  mmHg.  4. The aortic valve is tricuspid. There is mild calcification of the aortic valve. There is mild thickening of the aortic valve. Aortic valve regurgitation is not visualized. Mild to moderate aortic valve sclerosis/calcification is present, without any evidence of aortic stenosis.  5. The inferior vena cava is normal in size with greater than 50% respiratory variability, suggesting right atrial pressure of 3 mmHg. FINDINGS  Left Ventricle: Left ventricular  ejection fraction, by estimation, is 60 to 65%. The left ventricle has normal function. Left ventricular endocardial border not optimally defined to evaluate regional wall motion. The average left ventricular global longitudinal strain is -14.2 %. The global longitudinal strain is abnormal. The left ventricular internal cavity size was normal in size. There is borderline left ventricular hypertrophy. Left ventricular diastolic parameters are consistent with Grade I diastolic dysfunction (impaired relaxation). Right Ventricle: The right ventricular size is normal. No increase in right ventricular wall thickness. Right ventricular systolic function is normal. Left Atrium: Left atrial size was normal in size. Right Atrium: Right atrial size was normal in size. Pericardium: There is no evidence of pericardial effusion. Mitral Valve: The mitral valve is abnormal. There is mild thickening of the mitral valve leaflet(s). There is mild calcification of the mitral valve leaflet(s). Trivial mitral valve regurgitation. Mild to moderate mitral valve stenosis. MV peak gradient,  12.2 mmHg. The mean mitral valve gradient is 6.0 mmHg. Tricuspid Valve: The tricuspid valve is not well visualized. Tricuspid valve regurgitation is not demonstrated. Aortic Valve: The aortic valve is tricuspid. There is mild calcification of the aortic valve. There is mild thickening of the aortic valve. There is mild aortic valve annular calcification. Aortic valve regurgitation is not  visualized. Mild to moderate aortic valve sclerosis/calcification is present, without any evidence of aortic stenosis. Aortic valve mean gradient measures 9.0 mmHg. Aortic valve peak gradient measures 15.4 mmHg. Aortic valve area, by VTI measures 2.03 cm. Pulmonic Valve: The pulmonic valve was grossly normal. Pulmonic valve regurgitation is not visualized. No evidence of pulmonic stenosis. Aorta: The aortic root is normal in size and structure. Pulmonary Artery: The pulmonary artery is of normal size. Venous: The inferior vena cava is normal in size with greater than 50% respiratory variability, suggesting right atrial pressure of 3 mmHg. IAS/Shunts: The interatrial septum was not well visualized.  LEFT VENTRICLE PLAX 2D LVIDd:         4.60 cm  Diastology LVIDs:         3.00 cm  LV e' medial:    7.51 cm/s LV PW:         1.00 cm  LV E/e' medial:  14.5 LV IVS:        0.90 cm  LV e' lateral:   8.92 cm/s LVOT diam:     2.10 cm  LV E/e' lateral: 12.2 LV SV:         73 LV SV Index:   36       2D Longitudinal Strain LVOT Area:     3.46 cm 2D Strain GLS Avg:     -14.2 %  RIGHT VENTRICLE RV Basal diam:  2.80 cm RV Mid diam:    3.70 cm RV S prime:     12.50 cm/s LEFT ATRIUM             Index       RIGHT ATRIUM           Index LA diam:        3.60 cm 1.79 cm/m  RA Area:     12.40 cm LA Vol (A2C):   38.3 ml 19.02 ml/m RA Volume:   26.60 ml  13.21 ml/m LA Vol (A4C):   27.2 ml 13.51 ml/m LA Biplane Vol: 33.6 ml 16.69 ml/m  AORTIC VALVE                    PULMONIC VALVE AV Area (Vmax):  2.07 cm     PV Vmax:       1.08 m/s AV Area (Vmean):   2.16 cm     PV Vmean:      80.800 cm/s AV Area (VTI):     2.03 cm     PV VTI:        0.182 m AV Vmax:           196.00 cm/s  PV Peak grad:  4.7 mmHg AV Vmean:          143.000 cm/s PV Mean grad:  3.0 mmHg AV VTI:            0.359 m AV Peak Grad:      15.4 mmHg AV Mean Grad:      9.0 mmHg LVOT Vmax:         117.00 cm/s LVOT Vmean:        89.200 cm/s LVOT VTI:          0.210 m  LVOT/AV VTI ratio: 0.58  AORTA Ao Root diam: 3.20 cm MITRAL VALVE MV Area (PHT): 7.16 cm     SHUNTS MV Area VTI:   2.56 cm     Systemic VTI:  0.21 m MV Peak grad:  12.2 mmHg    Systemic Diam: 2.10 cm MV Mean grad:  6.0 mmHg MV Vmax:       1.75 m/s MV Vmean:      111.0 cm/s MV Decel Time: 106 msec MV E velocity: 109.00 cm/s MV A velocity: 151.00 cm/s MV E/A ratio:  0.72 Cristal De[place] End MD Electronically signed by Yvonne Kendall MD Signature Date/Time: 06/27/2020/11:24:38 AM    Final                LOS: 1 day   Sabeen Piechocki  [facility]   Pager on [email or web address]. If 7PM-7AM, please contact night-coverage at [email or web address]     06/28/2020, 2:09 PM

## 2020-06-28 NOTE — Progress Notes (Signed)
Inpatient Diabetes Program Recommendations  AACE/ADA: New Consensus Statement on Inpatient Glycemic Control (2015)  Target Ranges:  Prepandial:   less than 140 mg/dL      Peak postprandial:   less than 180 mg/dL (1-2 hours)      Critically ill patients:  140 - 180 mg/dL   Lab Results  Component Value Date   GLUCAP 290 (H) 06/28/2020   HGBA1C 7.9 (H) 06/27/2020    Review of Glycemic Control Results for AMARIONNA, ARCA (MRN [id number]) as of 06/28/2020 12:42  Ref. Range 06/27/2020 14:08 06/27/2020 17:56 06/27/2020 22:25 06/28/2020 07:31 06/28/2020 11:34  Glucose-Capillary Latest Ref Range: 70 - 99 mg/dL 528 (H) 413 (H) 244 (H) 200 (H) 290 (H)   Diabetes history: DM2 Outpatient Diabetes medications: Glucotrol 5 mg + Metformin 500 mg bid + Januvia 100 qd Current orders for Inpatient glycemic control: Novolog correction 0-15 units qid  Inpatient Diabetes Program Recommendations:   While oral DM medications on hold, consider: -Lantus 15 units qd (0.2 units/kg x 90.7 kg = 18 units) Secure chat sent to Dr. Myriam Forehand.  Will follow during hospitalization.  Thank you, Billy Fischer. Arla Boutwell, RN, MSN, CDE  Diabetes Coordinator Inpatient Glycemic Control Team Team Pager [phone] (8am-5pm) 06/28/2020 12:44 PM

## 2020-06-28 NOTE — Consult Note (Signed)
[facility] Cardiology Consultation Note  Patient ID: Madison Cuevas, MRN: [id number], DOB/AGE: 07-15-1945 75 y.o. Admit date: 06/26/2020   Date of Consult: 06/28/2020 Primary Physician: Kandyce Rud, MD Primary Cardiologist: None  Chief Complaint:  Chief Complaint  Patient presents with  . Code Sepsis  . Cough   Reason for Consult:  Possible endocarditis  HPI: 75 y.o. female with known coronary artery atherosclerosis diabetes hypertension hyperlipidemia previously on appropriate medication management and doing fairly well until the patient had an acute onset of episode of infection and pneumonia.  The patient had waxing and waning symptoms of this issue and then had sepsis.  With this the patient ended up with a significant bacteremia.  With her bacteremia there was concerns of issues of endocarditis.  Echocardiogram shows normal LV systolic function with mitral valve thickening but no evidence of overt mitral valve abnormality or endocarditis but concerns for this.  Further investigation will be necessary for assessment of endocarditis.  The patient understands this includes the possibility of transesophageal echocardiogram.  He understands risk and benefits of transesophageal echocardiogram.  This includes the possibility of death stroke heart attack esophageal perforation sore throat and side effects of medications.  She is at low risk for moderate sedation  Past Medical History:  Diagnosis Date  . Diabetes mellitus without complication (HCC)   . GERD (gastroesophageal reflux disease)   . Herpes zoster   . History of chicken pox   . Hyperlipidemia   . Hypertension    pt denies.  meds are for kidney protection  . Osteoarthrosis   . Pneumonia   . Wears dentures    lower partial      Surgical History:  Past Surgical History:  Procedure Laterality Date  . APPENDECTOMY    . BACK SURGERY    . CATARACT EXTRACTION W/PHACO Right 09/09/2018   Procedure: CATARACT EXTRACTION PHACO  AND INTRAOCULAR LENS PLACEMENT (IOC)  RIGHT DIABETIC  01:01.5  16.2%  9.99;  Surgeon: Lockie Mola, MD;  Location: [facility];  Service: Ophthalmology;  Laterality: Right;  Diabetic - oral meds  . COLONOSCOPY WITH PROPOFOL N/A 08/21/2015   Procedure: COLONOSCOPY WITH PROPOFOL;  Surgeon: Christena Deem, MD;  Location: [facility];  Service: Endoscopy;  Laterality: N/A;  . ESOPHAGOGASTRODUODENOSCOPY (EGD) WITH PROPOFOL N/A 06/21/2020   Procedure: ESOPHAGOGASTRODUODENOSCOPY (EGD) WITH PROPOFOL;  Surgeon: Toledo, Boykin Nearing, MD;  Location: [facility] ENDOSCOPY;  Service: Gastroenterology;  Laterality: N/A;  . JOINT REPLACEMENT Right    knee  . TONSILLECTOMY    . TUBAL LIGATION       Home Meds: Prior to Admission medications   Medication Sig Start Date End Date Taking? Authorizing Provider  Calcium-Vitamin D (CALTRATE 600 PLUS-VIT D PO) Take 1 tablet by mouth 2 (two) times daily.   Yes [provider]  glipiZIDE (GLUCOTROL XL) 5 MG 24 hr tablet Take 5 mg by mouth daily with breakfast.   Yes [provider]  losartan (COZAAR) 50 MG tablet Take 50 mg by mouth daily.   Yes [provider]  metFORMIN (GLUCOPHAGE) 500 MG tablet Take by mouth 2 (two) times daily with a meal.   Yes [provider]  montelukast (SINGULAIR) 10 MG tablet Take 10 mg by mouth daily as needed.   Yes [provider]  MULTIPLE VITAMIN PO Take 1 tablet by mouth daily.   Yes [provider]  MUPIROCIN EX Apply topically.   Yes [provider]  pantoprazole (PROTONIX) 40 MG tablet Take  1 tablet (40 mg total) by mouth daily. 05/23/20 05/23/21 Yes Paduchowski, Caryn Bee, MD  progesterone (PROMETRIUM) 100 MG capsule Take 100 mg by mouth daily.   Yes [provider]  psyllium (METAMUCIL) 58.6 % packet Take 1 packet by mouth daily.   Yes [provider]  simvastatin (ZOCOR) 20 MG tablet Take 20 mg by mouth daily.   Yes [provider]   sitaGLIPtin (JANUVIA) 100 MG tablet Take 100 mg by mouth daily.   Yes [provider]  tolterodine (DETROL LA) 4 MG 24 hr capsule Take 2 mg by mouth 2 (two) times daily.    Yes [provider]  venlafaxine (EFFEXOR) 75 MG tablet Take 75 mg by mouth daily.   Yes [provider]  albuterol (VENTOLIN HFA) 108 (90 Base) MCG/ACT inhaler Inhale 1-2 puffs into the lungs every 6 (six) hours as needed for wheezing or shortness of breath. Patient not taking: No sig reported    [provider]  azelastine (OPTIVAR) 0.05 % ophthalmic solution 1 drop 2 (two) times daily. Patient not taking: No sig reported    [provider]  CEPHALEXIN PO Take by mouth. Patient not taking: No sig reported    [provider]  ondansetron (ZOFRAN ODT) 4 MG disintegrating tablet Take 1 tablet (4 mg total) by mouth every 8 (eight) hours as needed for nausea or vomiting. 05/23/20   Minna Antis, MD    Inpatient Medications:  . apixaban  10 mg Oral BID  . [START ON 07/04/2020] apixaban  5 mg Oral BID  . fesoterodine  4 mg Oral Daily  . guaiFENesin  600 mg Oral BID  . insulin aspart  0-15 Units Subcutaneous TID AC & HS  . insulin glargine  15 Units Subcutaneous Daily  . ipratropium-albuterol  3 mL Nebulization TID  . losartan  50 mg Oral Daily  . multivitamin with minerals  1 tablet Oral Daily  . pantoprazole  40 mg Oral Daily  . progesterone  100 mg Oral Daily  . psyllium  1 packet Oral Daily  . simvastatin  20 mg Oral Daily  . venlafaxine XR  75 mg Oral Daily   . ampicillin-sulbactam (UNASYN) IV 3 g (06/28/20 1647)  . azithromycin Stopped (06/28/20 0240)    Allergies:  Allergies  Allergen Reactions  . Adhesive [Tape] Rash    With extended use    Social History   Socioeconomic History  . Marital status: Married    Spouse name: Not on file  . Number of children: Not on file  . Years of education: Not on file  . Highest education level: Not on file   Occupational History  . Not on file  Tobacco Use  . Smoking status: Former    Packs/day: 1.00    Years: 20.00    Pack years: 20.00    Types: Cigarettes    Quit date: 1999    Years since quitting: 23.4  . Smokeless tobacco: Never  Vaping Use  . Vaping Use: Never used  Substance and Sexual Activity  . Alcohol use: Yes    Comment: may have 1-2 drinks/month  . Drug use: No  . Sexual activity: Not Currently  Other Topics Concern  . Not on file  Social History Narrative  . Not on file   Social Determinants of Health   Financial Resource Strain: Not on file  Food Insecurity: Not on file  Transportation Needs: Not on file  Physical Activity: Not on file  Stress:  Not on file  Social Connections: Not on file  Intimate Partner Violence: Not on file     Family History  Problem Relation Age of Onset  . Breast cancer Cousin      Review of Systems Positive for shortness of breath Negative for: General:  chills, fever, night sweats or weight changes.  Cardiovascular: PND orthopnea syncope dizziness  Dermatological skin lesions rashes Respiratory: Cough congestion Urologic: Frequent urination urination at night and hematuria Abdominal: negative for nausea, vomiting, diarrhea, bright red blood per rectum, melena, or hematemesis Neurologic: negative for visual changes, and/or hearing changes  All other systems reviewed and are otherwise negative except as noted above.  Labs: No results for input(s): CKTOTAL, CKMB, TROPONINI in the last 72 hours. Lab Results  Component Value Date   WBC 9.1 06/28/2020   HGB 12.3 06/28/2020   HCT 36.0 06/28/2020   MCV 88.7 06/28/2020   PLT 167 06/28/2020    Recent Labs  Lab 06/26/20 2317 06/27/20 0504 06/28/20 0435  NA 135   < > 134*  K 4.1   < > 3.2*  CL 103   < > 102  CO2 23   < > 28  BUN 18   < > 9  CREATININE 0.89   < > 0.76  CALCIUM 9.1   < > 8.5*  PROT 7.4  --   --   BILITOT 0.6  --   --   ALKPHOS 46  --   --   ALT 13  --    --   AST 22  --   --   GLUCOSE 258*   < > 193*   < > = values in this interval not displayed.   No results found for: CHOL, HDL, LDLCALC, TRIG No results found for: DDIMER  Radiology/Studies:  CT Angio Chest PE W and/or Wo Contrast  Result Date: 06/27/2020 CLINICAL DATA:  Chest pain.  Shortness of breath.  Cough and fever. EXAM: CT ANGIOGRAPHY CHEST WITH CONTRAST TECHNIQUE: Multidetector CT imaging of the chest was performed using the standard protocol during bolus administration of intravenous contrast. Multiplanar CT image reconstructions and MIPs were obtained to evaluate the vascular anatomy. CONTRAST:  21mL OMNIPAQUE IOHEXOL 350 MG/ML SOLN COMPARISON:  None. FINDINGS: Cardiovascular: Satisfactory opacification of the pulmonary arteries to the segmental level. Motion artifact diminishes exam detail. There is a single tiny (3 mm) filling defect within the right lower lobar pulmonary artery, equivocal for clinically significant pulmonary embolus, image 53/4. Heart size is within normal limits. No pericardial effusion. Aortic atherosclerosis. Lad coronary artery calcifications. Mediastinum/Nodes: Normal appearance of the thyroid gland. The trachea appears patent and is midline. Normal appearance of the esophagus. No enlarged lymph nodes Lungs/Pleura: Rounded airspace consolidation with surrounding ground-glass attenuation is noted within the right middle lobe measuring approximately 3.8 cm. No pleural effusion or edema. 4 mm perifissural nodule is identified. Upper Abdomen: No acute abnormality Musculoskeletal: No chest wall abnormality. No acute or significant osseous findings. Review of the MIP images confirms the above findings. IMPRESSION: 1. Motion artifact diminishes exam detail. 2. There is a single tiny (3 mm) filling defect within the right lower lobar pulmonary artery, equivocal for clinically significant pulmonary embolus. 3. Rounded airspace consolidation with surrounding ground-glass  attenuation is identified within the right middle lobe. Findings are favored to represent an area of pneumonia. Follow-up imaging is advised to ensure resolution and exclude underlying malignancy. 4. Coronary artery calcifications noted. 5. Aortic atherosclerosis. Aortic Atherosclerosis (ICD10-I70.0). Electronically Signed  By: Signa Kell M.D.   On: 06/27/2020 01:42   DG Chest Port 1 View  Result Date: 06/26/2020 CLINICAL DATA:  Fever. EXAM: PORTABLE CHEST 1 VIEW COMPARISON:  Chest x-ray 05/08/2011 FINDINGS: The heart size and mediastinal contours are unchanged. Aortic calcification. Interval development of a 4 x 3 cm density at the right costocardiac angle of unclear etiology. No pulmonary edema. No pleural effusion. No pneumothorax. No acute osseous abnormality. IMPRESSION: Interval development of a 4 x 3 cm density at the right costocardiac angle of unclear etiology. Recommend CT chest with intravenous contrast for further evaluation. Electronically Signed   By: Tish Frederickson M.D.   On: 06/26/2020 23:37   ECHOCARDIOGRAM COMPLETE  Result Date: 06/27/2020    ECHOCARDIOGRAM REPORT   Patient Name:   Madison Cuevas Date of Exam: 06/27/2020 Medical Rec #:  [id number]      Height:       67.0 in Accession #:    [id number]     Weight:       198.0 lb Date of Birth:  02/11/1945      BSA:          2.014 m Patient Age:    75 years       BP:           144/61 mmHg Patient Gender: F              HR:           110 bpm. Exam Location:  [facility] Procedure: 2D Echo, Color Doppler and Cardiac Doppler Indications:     I26.09 Pulmonary Embolus  History:         Patient has no prior history of Echocardiogram examinations.                  Signs/Symptoms:Fever and Cough; Risk Factors:Hypertension,                  Diabetes and Dyslipidemia.  Sonographer:     Humphrey Rolls RDCS (AE) Referring Phys:  [id number] Vernetta Honey MANSY Diagnosing Phys: Yvonne Kendall MD  Sonographer Comments: Global longitudinal strain was attempted. IMPRESSIONS   1. Left ventricular ejection fraction, by estimation, is 60 to 65%. The left ventricle has normal function. Left ventricular endocardial border not optimally defined to evaluate regional wall motion. Left ventricular diastolic parameters are consistent with Grade I diastolic dysfunction (impaired relaxation). The average left ventricular global longitudinal strain is -14.2 %. The global longitudinal strain is abnormal.  2. Right ventricular systolic function is normal. The right ventricular size is normal.  3. The mitral valve is abnormal. Trivial mitral valve regurgitation. Mild to moderate mitral stenosis. The mean mitral valve gradient is 6.0 mmHg.  4. The aortic valve is tricuspid. There is mild calcification of the aortic valve. There is mild thickening of the aortic valve. Aortic valve regurgitation is not visualized. Mild to moderate aortic valve sclerosis/calcification is present, without any evidence of aortic stenosis.  5. The inferior vena cava is normal in size with greater than 50% respiratory variability, suggesting right atrial pressure of 3 mmHg. FINDINGS  Left Ventricle: Left ventricular ejection fraction, by estimation, is 60 to 65%. The left ventricle has normal function. Left ventricular endocardial border not optimally defined to evaluate regional wall motion. The average left ventricular global longitudinal strain is -14.2 %. The global longitudinal strain is abnormal. The left ventricular internal cavity size was normal in size. There is borderline left ventricular hypertrophy. Left ventricular diastolic parameters  are consistent with Grade I diastolic dysfunction (impaired relaxation). Right Ventricle: The right ventricular size is normal. No increase in right ventricular wall thickness. Right ventricular systolic function is normal. Left Atrium: Left atrial size was normal in size. Right Atrium: Right atrial size was normal in size. Pericardium: There is no evidence of pericardial effusion.  Mitral Valve: The mitral valve is abnormal. There is mild thickening of the mitral valve leaflet(s). There is mild calcification of the mitral valve leaflet(s). Trivial mitral valve regurgitation. Mild to moderate mitral valve stenosis. MV peak gradient,  12.2 mmHg. The mean mitral valve gradient is 6.0 mmHg. Tricuspid Valve: The tricuspid valve is not well visualized. Tricuspid valve regurgitation is not demonstrated. Aortic Valve: The aortic valve is tricuspid. There is mild calcification of the aortic valve. There is mild thickening of the aortic valve. There is mild aortic valve annular calcification. Aortic valve regurgitation is not visualized. Mild to moderate aortic valve sclerosis/calcification is present, without any evidence of aortic stenosis. Aortic valve mean gradient measures 9.0 mmHg. Aortic valve peak gradient measures 15.4 mmHg. Aortic valve area, by VTI measures 2.03 cm. Pulmonic Valve: The pulmonic valve was grossly normal. Pulmonic valve regurgitation is not visualized. No evidence of pulmonic stenosis. Aorta: The aortic root is normal in size and structure. Pulmonary Artery: The pulmonary artery is of normal size. Venous: The inferior vena cava is normal in size with greater than 50% respiratory variability, suggesting right atrial pressure of 3 mmHg. IAS/Shunts: The interatrial septum was not well visualized.  LEFT VENTRICLE PLAX 2D LVIDd:         4.60 cm  Diastology LVIDs:         3.00 cm  LV e' medial:    7.51 cm/s LV PW:         1.00 cm  LV E/e' medial:  14.5 LV IVS:        0.90 cm  LV e' lateral:   8.92 cm/s LVOT diam:     2.10 cm  LV E/e' lateral: 12.2 LV SV:         73 LV SV Index:   36       2D Longitudinal Strain LVOT Area:     3.46 cm 2D Strain GLS Avg:     -14.2 %  RIGHT VENTRICLE RV Basal diam:  2.80 cm RV Mid diam:    3.70 cm RV S prime:     12.50 cm/s LEFT ATRIUM             Index       RIGHT ATRIUM           Index LA diam:        3.60 cm 1.79 cm/m  RA Area:     12.40 cm LA  Vol (A2C):   38.3 ml 19.02 ml/m RA Volume:   26.60 ml  13.21 ml/m LA Vol (A4C):   27.2 ml 13.51 ml/m LA Biplane Vol: 33.6 ml 16.69 ml/m  AORTIC VALVE                    PULMONIC VALVE AV Area (Vmax):    2.07 cm     PV Vmax:       1.08 m/s AV Area (Vmean):   2.16 cm     PV Vmean:      80.800 cm/s AV Area (VTI):     2.03 cm     PV VTI:        0.182 m  AV Vmax:           196.00 cm/s  PV Peak grad:  4.7 mmHg AV Vmean:          143.000 cm/s PV Mean grad:  3.0 mmHg AV VTI:            0.359 m AV Peak Grad:      15.4 mmHg AV Mean Grad:      9.0 mmHg LVOT Vmax:         117.00 cm/s LVOT Vmean:        89.200 cm/s LVOT VTI:          0.210 m LVOT/AV VTI ratio: 0.58  AORTA Ao Root diam: 3.20 cm MITRAL VALVE MV Area (PHT): 7.16 cm     SHUNTS MV Area VTI:   2.56 cm     Systemic VTI:  0.21 m MV Peak grad:  12.2 mmHg    Systemic Diam: 2.10 cm MV Mean grad:  6.0 mmHg MV Vmax:       1.75 m/s MV Vmean:      111.0 cm/s MV Decel Time: 106 msec MV E velocity: 109.00 cm/s MV A velocity: 151.00 cm/s MV E/A ratio:  0.72 Cristal Deerhristopher End MD Electronically signed by Yvonne Kendallhristopher End MD Signature Date/Time: 06/27/2020/11:24:38 AM    Final     EKG: Normal sinus rhythm  Weights: Filed Weights   06/27/20 2341 06/28/20 0500  Weight: 90.9 kg 90.7 kg     Physical Exam: Blood pressure 137/70, pulse 94, temperature 97.7 F (36.5 C), temperature source Oral, resp. rate 18, height 5\' 7"  (1.702 m), weight 90.7 kg, SpO2 96 %. Body mass index is 31.32 kg/m. General: Well developed, well nourished, in no acute distress. Head eyes ears nose throat: Normocephalic, atraumatic, sclera non-icteric, no xanthomas, nares are without discharge. No apparent thyromegaly and/or mass  Lungs: Normal respiratory effort.  no wheezes, no rales, no rhonchi.  Heart: RRR with normal S1 S2. no murmur gallop, no rub, PMI is normal size and placement, carotid upstroke normal without bruit, jugular venous pressure is normal Abdomen: Soft, non-tender,  non-distended with normoactive bowel sounds. No hepatomegaly. No rebound/guarding. No obvious abdominal masses. Abdominal aorta is normal size without bruit Extremities: No edema. no cyanosis, no clubbing, no ulcers  Peripheral : 2+ bilateral upper extremity pulses, 2+ bilateral femoral pulses, 2+ bilateral dorsal pedal pulse Neuro: Alert and oriented. No facial asymmetry. No focal deficit. Moves all extremities spontaneously. Musculoskeletal: Normal muscle tone without kyphosis Psych:  Responds to questions appropriately with a normal affect.    Assessment: 75 year old female with diabetes hypertension hyperlipidemia coronary artery atherosclerosis with sepsis and bacteremia with concerns for the possibility of endocarditis  Plan: 1.  Continue supportive care of endocarditis and infection and sepsis 2.  Proceed to transesophageal echocardiogram as per above 3.  Continue risk factor modification for cardiovascular disease as previous  Signed, Lamar BlinksBruce J Cashel Bellina M.D. [facility] 06/28/2020, 6:23 PM

## 2020-06-28 NOTE — Consult Note (Addendum)
NAME: Madison Cuevas  DOB: 1945/04/08  MRN: [id number]  Date/Time: 06/28/2020 11:27 AM  REQUESTING PROVIDER: Dr. Myriam Forehand Subjective:  REASON FOR CONSULT: bacteremia ? Madison Cuevas is a 75 y.o. female with a history of   Pt 's daughter was sick with URI 2 weeks ago and stayed with her for 3 days.last Sunday 06/18/20 pt developed scratchy throat, post nasal drip. Had endoscopy on 06/20/20. Marland Kitchen She was shopping in [place] on Sunday with her daughter and on Monday had wheezing, then chills, and went to the recliner. She does not remember anything after that and her husband said she was confused. Since May 2022 she has had 4 episodes of vomiting followed by diarrhea- lasting 2-6 days- no pain abdomen , no fever. Underwent upper endoscopy on 6/14 . Had colonoscopy in 2021 for rectal bleed. 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 Labs showed WBC 12.9, HB 14.2, PLT 191 and cr 0.89. Blood culture sent and she was started on vanco, cefepime. CTA showed PE, Rounded airspace consolidation with surrounding ground-glass attenuation is noted within the right middle lobe measuring approximately 3.8 cm. single tiny (3 mm) filling defect within the right lower lobar pulmonary artery, equivocal for clinically significant pulmonary embolus.  Blood culture came back as gram neg bacteremia and I am seeing the patient for the same Pt says she is feeling much better today Family at bedside She is not confused and verbally appropriate   Past Medical History:  Diagnosis Date   Diabetes mellitus without complication (HCC)    GERD (gastroesophageal reflux disease)    Herpes zoster    History of chicken pox    Hyperlipidemia    Hypertension    pt denies.  meds are for kidney protection    Osteoarthrosis    Pneumonia    Wears dentures    lower partial    Past Surgical History:  Procedure Laterality Date   APPENDECTOMY     BACK SURGERY     CATARACT EXTRACTION W/PHACO Right 09/09/2018   Procedure: CATARACT EXTRACTION PHACO AND INTRAOCULAR LENS PLACEMENT (IOC)  RIGHT DIABETIC  01:01.5  16.2%  9.99;  Surgeon: Lockie Mola, MD;  Location: [facility];  Service: Ophthalmology;  Laterality: Right;  Diabetic - oral meds   COLONOSCOPY WITH PROPOFOL N/A 08/21/2015   Procedure: COLONOSCOPY WITH PROPOFOL;  Surgeon: Christena Deem, MD;  Location: [facility];  Service: Endoscopy;  Laterality: N/A;   ESOPHAGOGASTRODUODENOSCOPY (EGD) WITH PROPOFOL N/A 06/21/2020   Procedure: ESOPHAGOGASTRODUODENOSCOPY (EGD) WITH PROPOFOL;  Surgeon: Toledo, Boykin Nearing, MD;  Location: [facility] ENDOSCOPY;  Service: Gastroenterology;  Laterality: N/A;   JOINT REPLACEMENT Right    knee   TONSILLECTOMY     TUBAL LIGATION      Social History   Socioeconomic History   Marital status: Married    Spouse name: Not on file   Number of children: Not on file   Years of education: Not on file   Highest education level: Not on file  Occupational History   Not on file  Tobacco Use   Smoking status: Former    Packs/day: 1.00    Years: 20.00    Pack years: 20.00    Types: Cigarettes    Quit date: 1999  Years since quitting: 23.4   Smokeless tobacco: Never  Vaping Use   Vaping Use: Never used  Substance and Sexual Activity   Alcohol use: Yes    Comment: may have 1-2 drinks/month   Drug use: No   Sexual activity: Not Currently  Other Topics Concern   Not on file  Social History Narrative   Not on file   Social Determinants of Health   Financial Resource Strain: Not on file  Food Insecurity: Not on file  Transportation Needs: Not on file  Physical Activity: Not on file  Stress: Not on file  Social Connections: Not on file  Intimate Partner Violence: Not on file    Family History   Problem Relation Age of Onset   Breast cancer Cousin    Allergies  Allergen Reactions   Adhesive [Tape] Rash    With extended use   I? Current Facility-Administered Medications  Medication Dose Route Frequency Provider Last Rate Last Admin   acetaminophen (TYLENOL) tablet 650 mg  650 mg Oral Q6H PRN Mansy, Jan A, MD   650 mg at 06/28/20 4580   Or   acetaminophen (TYLENOL) suppository 650 mg  650 mg Rectal Q6H PRN Mansy, Jan A, MD       apixaban (ELIQUIS) tablet 10 mg  10 mg Oral BID Mila Merry A, RPH   10 mg at 06/28/20 0816   [START ON 07/04/2020] apixaban (ELIQUIS) tablet 5 mg  5 mg Oral BID Mila Merry A, RPH       azithromycin (ZITHROMAX) 500 mg in sodium chloride 0.9 % 250 mL IVPB  500 mg Intravenous Q24H Mansy, Jan A, MD   Stopped at 06/28/20 0240   chlorpheniramine-HYDROcodone (TUSSIONEX) 10-8 MG/5ML suspension 5 mL  5 mL Oral Q12H PRN Mansy, Jan A, MD   5 mL at 06/27/20 1426   fesoterodine (TOVIAZ) tablet 4 mg  4 mg Oral Daily Mansy, Jan A, MD   4 mg at 06/28/20 0817   guaiFENesin (MUCINEX) 12 hr tablet 600 mg  600 mg Oral BID Mansy, Jan A, MD   600 mg at 06/28/20 0817   insulin aspart (novoLOG) injection 0-15 Units  0-15 Units Subcutaneous TID [facility] Mansy, Jan A, MD   3 Units at 06/28/20 0818   ipratropium-albuterol (DUONEB) 0.5-2.5 (3) MG/3ML nebulizer solution 3 mL  3 mL Nebulization TID Lurene Shadow, MD       ketorolac (TORADOL) 30 MG/ML injection 15 mg  15 mg Intravenous Q6H PRN Mansy, Jan A, MD       losartan (COZAAR) tablet 50 mg  50 mg Oral Daily Mansy, Jan A, MD   50 mg at 06/28/20 9983   magnesium hydroxide (MILK OF MAGNESIA) suspension 30 mL  30 mL Oral Daily PRN Mansy, Jan A, MD       magnesium sulfate IVPB 2 g 50 mL  2 g Intravenous Once Lurene Shadow, MD 50 mL/hr at 06/28/20 1038 2 g at 06/28/20 1038   montelukast (SINGULAIR) tablet 10 mg  10 mg Oral Daily PRN Mansy, Jan A, MD       multivitamin with minerals tablet 1 tablet  1 tablet Oral Daily Mansy, Jan  A, MD   1 tablet at 06/28/20 0815   ondansetron (ZOFRAN) tablet 4 mg  4 mg Oral Q6H PRN Mansy, Jan A, MD       Or   ondansetron [facility]) injection 4 mg  4 mg Intravenous Q6H PRN Mansy, Vernetta Honey, MD  pantoprazole (PROTONIX) EC tablet 40 mg  40 mg Oral Daily Mansy, Jan A, MD   40 mg at 06/28/20 0816   piperacillin-tazobactam (ZOSYN) IVPB 3.375 g  3.375 g Intravenous Q8H Charise Killian, MD 12.5 mL/hr at 06/28/20 0619 3.375 g at 06/28/20 1610   progesterone (PROMETRIUM) capsule 100 mg  100 mg Oral Daily Mansy, Jan A, MD   100 mg at 06/28/20 0817   psyllium (HYDROCIL/METAMUCIL) 1 packet  1 packet Oral Daily Mansy, Jan A, MD   1 packet at 06/28/20 0818   simvastatin (ZOCOR) tablet 20 mg  20 mg Oral Daily Mansy, Jan A, MD   20 mg at 06/28/20 9604   traZODone (DESYREL) tablet 25 mg  25 mg Oral QHS PRN Mansy, Jan A, MD       venlafaxine XR (EFFEXOR-XR) 24 hr capsule 75 mg  75 mg Oral Daily Mansy, Jan A, MD   75 mg at 06/28/20 5409     Abtx:  Anti-infectives (From admission, onward)    Start     Dose/Rate Route Frequency Ordered Stop   06/27/20 1400  piperacillin-tazobactam (ZOSYN) IVPB 3.375 g        3.375 g 12.5 mL/hr over 240 Minutes Intravenous Every 8 hours 06/27/20 1226     06/27/20 0700  cefTRIAXone (ROCEPHIN) 2 g in sodium chloride 0.9 % 100 mL IVPB  Status:  Discontinued        2 g 200 mL/hr over 30 Minutes Intravenous Every 24 hours 06/27/20 0234 06/27/20 1226   06/27/20 0245  azithromycin (ZITHROMAX) 500 mg in sodium chloride 0.9 % 250 mL IVPB        500 mg 250 mL/hr over 60 Minutes Intravenous Every 24 hours 06/27/20 0234 07/02/20 0244   06/27/20 0100  vancomycin (VANCOCIN) IVPB 1000 mg/200 mL premix        1,000 mg 200 mL/hr over 60 Minutes Intravenous  Once 06/27/20 0050 06/27/20 0451   06/27/20 0045  vancomycin (VANCOREADY) IVPB 2000 mg/400 mL  Status:  Discontinued        2,000 mg 200 mL/hr over 120 Minutes Intravenous  Once 06/27/20 0044 06/27/20 0050   06/27/20 0030   vancomycin (VANCOCIN) IVPB 1000 mg/200 mL premix  Status:  Discontinued        1,000 mg 200 mL/hr over 60 Minutes Intravenous  Once 06/27/20 0025 06/27/20 0312   06/27/20 0030  ceFEPIme (MAXIPIME) 2 g in sodium chloride 0.9 % 100 mL IVPB        2 g 200 mL/hr over 30 Minutes Intravenous  Once 06/27/20 0025 06/27/20 0132       REVIEW OF SYSTEMS:  Const:  fever,  chills, negative weight loss Eyes: negative diplopia or visual changes, negative eye pain ENT: coryza, negative sore throat Resp:  cough, no hemoptysis, dyspnea Cards: negative for chest pain, palpitations, lower extremity edema GU: negative for frequency, dysuria and hematuria GI: as above Skin: negative for rash and pruritus Heme: negative for easy bruising and gum/nose bleeding MS: generalized weakness Neurolo:confusion and altered mental status Psych: negative for feelings of anxiety, depression  Endocrine: , diabetes Allergy/Immunology- negative for any medication or food allergies ? Objective:  VITALS:  BP (!) 153/76 (BP Location: Left Arm)   Pulse (!) 101   Temp 98.9 F (37.2 C) (Oral)   Resp 16   Ht  (1.702 m)   Wt 90.7 kg   SpO2 94%   BMI 31.32 kg/m  PHYSICAL EXAM:  General: Alert, cooperative,  no distress, appears stated age.  Head: Normocephalic, without obvious abnormality, atraumatic. Eyes: Conjunctivae clear, anicteric sclerae. Pupils are equal ENT Nares normal. No drainage or sinus tenderness. Lips, mucosa, and tongue normal. No Thrush Neck: Supple, symmetrical, no adenopathy, thyroid: non tender no carotid bruit and no JVD. Back: No CVA tenderness. Lungs: b/l air entry crepts bases Heart: Regular rate and rhythm, no murmur, rub or gallop. Abdomen: Soft, non-tender,not distended. Bowel sounds normal. No masses Extremities: healed scratch mark rt arm   Skin: No rashes or lesions. Or bruising Rt knee scar - no swelling of knee joint Lymph: Cervical, supraclavicular normal. Neurologic:  Grossly non-focal Pertinent Labs Lab Results CBC    Component Value Date/Time   WBC 9.1 06/28/2020 0435   RBC 4.06 06/28/2020 0435   HGB 12.3 06/28/2020 0435   HGB 11.1 (L) 05/08/2011 0406   HCT 36.0 06/28/2020 0435   HCT 43.1 04/22/2011 1525   PLT 167 06/28/2020 0435   PLT 225 04/22/2011 1525   MCV 88.7 06/28/2020 0435   MCV 90 04/22/2011 1525   MCH 30.3 06/28/2020 0435   MCHC 34.2 06/28/2020 0435   RDW 12.2 06/28/2020 0435   RDW 13.0 04/22/2011 1525   LYMPHSABS 1.1 06/26/2020 2317   MONOABS 0.5 06/26/2020 2317   EOSABS 0.1 06/26/2020 2317   BASOSABS 0.0 06/26/2020 2317    CMP Latest Ref Rng & Units 06/28/2020 06/27/2020 06/26/2020  Glucose 70 - 99 mg/dL 622(W) 979(G) 921(J)  BUN 8 - 23 mg/dL 9 13 18   Creatinine 0.44 - 1.00 mg/dL 9.41 7.40  Sodium 135 - 145 mmol/L 134(L) 137 135  Potassium 3.5 - 5.1 mmol/L 3.2(L) 4.1 4.1  Chloride 98 - 111 mmol/L 102 103 103  CO2 22 - 32 mmol/L 28 29 23   Calcium 8.9 - 10.3 mg/dL 8.14) ) 9.1  Total Protein 6.5 - 8.1 g/dL - - 7.4  Total Bilirubin 0.3 - 1.2 mg/dL - - 0.6  Alkaline Phos 38 - 126 U/L - - 46  AST 15 - 41 U/L - - 22  ALT 0 - 44 U/L - - 13      Microbiology: Recent Results (from the past 240 hour(s))  Culture, blood (Routine x 2)     Status: None (Preliminary result)   Collection Time: 06/26/20 11:17 PM   Specimen: BLOOD  Result Value Ref Range Status   Specimen Description   Final    BLOOD RIGHT FOREARM Performed at [facility], [address], [place], [address] [place]    Special Requests   Final    BOTTLES DRAWN AEROBIC AND ANAEROBIC Blood Culture results may not be optimal due to an excessive volume of blood received in culture bottles Performed at [facility], [address] [place], [address] [place]    Culture  Setup Time   Final    GRAM NEGATIVE RODS IN BOTH AEROBIC AND ANAEROBIC BOTTLES CRITICAL VALUE NOTED.  VALUE IS CONSISTENT WITH PREVIOUSLY REPORTED AND CALLED  VALUE. Performed at [facility], [address] [place], [address] [place]    Culture   Final    [place] NEGATIVE RODS IDENTIFICATION TO FOLLOW Performed at [facility], [address], [place], [address] [place]    Report Status PENDING  Incomplete  Resp Panel by RT-PCR (Flu A&B, Covid) Nasopharyngeal Swab     Status: None   Collection Time: 06/26/20 11:18 PM   Specimen: Nasopharyngeal Swab; Nasopharyngeal(NP) swabs in vial transport medium  Result Value Ref  Range Status   SARS Coronavirus 2 by RT PCR NEGATIVE NEGATIVE Final    Comment: (NOTE) SARS-CoV-2 target nucleic acids are NOT DETECTED.  The SARS-CoV-2 RNA is generally detectable in upper respiratory specimens during the acute phase of infection. The lowest concentration of SARS-CoV-2 viral copies this assay can detect is 138 copies/mL. A negative result does not preclude SARS-Cov-2 infection and should not be used as the sole basis for treatment or other patient management decisions. A negative result may occur with  improper specimen collection/handling, submission of specimen other than nasopharyngeal swab, presence of viral mutation(s) within the areas targeted by this assay, and inadequate number of viral copies(<138 copies/mL). A negative result must be combined with clinical observations, patient history, and epidemiological information. The expected result is Negative.  Fact Sheet for Patients:  BloggerCourse.comhttps://www.fda.gov/media/152166/download  Fact Sheet for Healthcare Providers:  SeriousBroker.ithttps://www.fda.gov/media/152162/download  This test is no t yet approved or cleared by the [place] FDA and  has been authorized for detection and/or diagnosis of SARS-CoV-2 by FDA under an Emergency Use Authorization (EUA). This EUA will remain  in effect (meaning this test can be used) for the duration of the COVID-19 declaration under Section 564(b)(1) of the Act, 21 U.S.C.section 360bbb-3(b)(1), unless the  authorization is terminated  or revoked sooner.       Influenza A by PCR NEGATIVE NEGATIVE Final   Influenza B by PCR NEGATIVE NEGATIVE Final    Comment: (NOTE) The Xpert Xpress SARS-CoV-2/FLU/RSV plus assay is intended as an aid in the diagnosis of influenza from Nasopharyngeal swab specimens and should not be used as a sole basis for treatment. Nasal washings and aspirates are unacceptable for Xpert Xpress SARS-CoV-2/FLU/RSV testing.  Fact Sheet for Patients: BloggerCourse.comhttps://www.fda.gov/media/152166/download  Fact Sheet for Healthcare Providers: SeriousBroker.ithttps://www.fda.gov/media/152162/download  This test is not yet approved or cleared by the [place] FDA and has been authorized for detection and/or diagnosis of SARS-CoV-2 by FDA under an Emergency Use Authorization (EUA). This EUA will remain in effect (meaning this test can be used) for the duration of the COVID-19 declaration under Section 564(b)(1) of the Act, 21 U.S.C. section 360bbb-3(b)(1), unless the authorization is terminated or revoked.  Performed at [facility], [address] [place], [place] [address]   Culture, blood (Routine x 2)     Status: None (Preliminary result)   Collection Time: 06/27/20 12:25 AM   Specimen: BLOOD  Result Value Ref Range Status   Specimen Description   Final    BLOOD LEFT ANTECUBITAL Performed at [facility], [address] [place], [place] [address]    Special Requests   Final    BOTTLES DRAWN AEROBIC AND ANAEROBIC Blood Culture adequate volume Performed at [facility], [address] [place], [place] [address]    Culture  Setup Time   Final    GRAM NEGATIVE RODS IN BOTH AEROBIC AND ANAEROBIC BOTTLES CRITICAL RESULT CALLED TO, READ BACK BY AND VERIFIED WITH: DEVIN MITCHELL, PHARMD AT 1134 ON 06/27/20 BY GM    Culture   Final    GRAM NEGATIVE RODS IDENTIFICATION TO FOLLOW Performed at [facility], [address] [place], [place] [address]     Report Status PENDING  Incomplete  Blood Culture ID Panel (Reflexed)     Status: None   Collection Time: 06/27/20 12:25 AM  Result Value Ref Range Status   Enterococcus faecalis NOT DETECTED NOT DETECTED Final   Enterococcus Faecium NOT DETECTED NOT DETECTED Final   Listeria  monocytogenes NOT DETECTED NOT DETECTED Final   Staphylococcus species NOT DETECTED NOT DETECTED Final   Staphylococcus aureus (BCID) NOT DETECTED NOT DETECTED Final   Staphylococcus epidermidis NOT DETECTED NOT DETECTED Final   Staphylococcus lugdunensis NOT DETECTED NOT DETECTED Final   Streptococcus species NOT DETECTED NOT DETECTED Final   Streptococcus agalactiae NOT DETECTED NOT DETECTED Final   Streptococcus pneumoniae NOT DETECTED NOT DETECTED Final   Streptococcus pyogenes NOT DETECTED NOT DETECTED Final   A.calcoaceticus-baumannii NOT DETECTED NOT DETECTED Final   Bacteroides fragilis NOT DETECTED NOT DETECTED Final   Enterobacterales NOT DETECTED NOT DETECTED Final   Enterobacter cloacae complex NOT DETECTED NOT DETECTED Final   Escherichia coli NOT DETECTED NOT DETECTED Final   Klebsiella aerogenes NOT DETECTED NOT DETECTED Final   Klebsiella oxytoca NOT DETECTED NOT DETECTED Final   Klebsiella pneumoniae NOT DETECTED NOT DETECTED Final   Proteus species NOT DETECTED NOT DETECTED Final   Salmonella species NOT DETECTED NOT DETECTED Final   Serratia marcescens NOT DETECTED NOT DETECTED Final   Haemophilus influenzae NOT DETECTED NOT DETECTED Final   Neisseria meningitidis NOT DETECTED NOT DETECTED Final   Pseudomonas aeruginosa NOT DETECTED NOT DETECTED Final   Stenotrophomonas maltophilia NOT DETECTED NOT DETECTED Final   Candida albicans NOT DETECTED NOT DETECTED Final   Candida auris NOT DETECTED NOT DETECTED Final   Candida glabrata NOT DETECTED NOT DETECTED Final   Candida krusei NOT DETECTED NOT DETECTED Final   Candida parapsilosis NOT DETECTED NOT DETECTED Final   Candida tropicalis NOT  DETECTED NOT DETECTED Final   Cryptococcus neoformans/gattii NOT DETECTED NOT DETECTED Final    Comment: Performed at [facility], [address] [place], [place] [address]  Urine Culture     Status: None   Collection Time: 06/27/20  1:41 AM   Specimen: Urine, Catheterized  Result Value Ref Range Status   Specimen Description   Final    URINE, CATHETERIZED Performed at [facility], [address], [place], [place] [address]    Special Requests   Final    NONE Performed at [facility], [address], [place], [place] [address]    Culture   Final    NO GROWTH Performed at [facility], [address], [place], [place] [address]    Report Status 06/28/2020 FINAL  Final    IMAGING RESULTS:  I have personally reviewed the films ? Impression/Recommendation ? 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 Sepsis secondary to the above  Acute encephalopathy due to sepsis- resolved  PE- unprovoked- needs doppler legs  Pneumonia rt middle lobe Bacterial VS viral( as her daughter had resp symptoms) will get resp viral PCR  She will need TEE to look for endocarditis, PFO, left to rt shunt Change zosyn to unasyn  DM- on insulin ? Erosive gastritis  _______________________________________________ Discussed with patient, family and requesting provider Note:  This document was prepared using Dragon voice recognition software and may include unintentional dictation errors.

## 2020-06-29 ENCOUNTER — Inpatient Hospital Stay: Payer: Medicare PPO

## 2020-06-29 ENCOUNTER — Encounter
Admission: EM | Disposition: A | Discharge status: Home / Self Care | Payer: Self-pay | Source: Home / Self Care | Attending: Internal Medicine

## 2020-06-29 ENCOUNTER — Inpatient Hospital Stay
Admit: 2020-06-29 | Discharge: 2020-06-29 | Disposition: A | Discharge status: Home / Self Care | Payer: Medicare PPO | Attending: Internal Medicine | Admitting: Internal Medicine

## 2020-06-29 DIAGNOSIS — R7881 Bacteremia: Secondary | ICD-10-CM | POA: Diagnosis present

## 2020-06-29 DIAGNOSIS — I2699 Other pulmonary embolism without acute cor pulmonale: Secondary | ICD-10-CM

## 2020-06-29 DIAGNOSIS — A280 Pasteurellosis: Secondary | ICD-10-CM | POA: Diagnosis present

## 2020-06-29 DIAGNOSIS — R6520 Severe sepsis without septic shock: Secondary | ICD-10-CM

## 2020-06-29 DIAGNOSIS — A419 Sepsis, unspecified organism: Secondary | ICD-10-CM

## 2020-06-29 HISTORY — PX: TEE WITHOUT CARDIOVERSION: SHX5443

## 2020-06-29 LAB — RESPIRATORY PANEL BY PCR

## 2020-06-29 LAB — GLUCOSE, CAPILLARY
Glucose-Capillary: 198 mg/dL — ABNORMAL HIGH (ref 70–99)
Glucose-Capillary: 211 mg/dL — ABNORMAL HIGH (ref 70–99)
Glucose-Capillary: 309 mg/dL — ABNORMAL HIGH (ref 70–99)
Glucose-Capillary: 194 mg/dL — ABNORMAL HIGH (ref 70–99)
Glucose-Capillary: 138 mg/dL — ABNORMAL HIGH (ref 70–99)

## 2020-06-29 LAB — BASIC METABOLIC PANEL
Anion gap: 8 (ref 5–15)
BUN: 11 mg/dL (ref 8–23)
CO2: 26 mmol/L (ref 22–32)
Calcium: 9.3 mg/dL (ref 8.9–10.3)
Chloride: 106 mmol/L (ref 98–111)
Creatinine, Ser: 0.82 mg/dL (ref 0.44–1.00)
GFR, Estimated: >60 mL/min (ref >60)
Glucose, Bld: 195 mg/dL — ABNORMAL HIGH (ref 70–99)
Potassium: 4 mmol/L (ref 3.5–5.1)
Sodium: 140 mmol/L (ref 135–145)

## 2020-06-29 LAB — CULTURE, BLOOD (ROUTINE X 2): Special Requests: ADEQUATE

## 2020-06-29 LAB — MAGNESIUM: Magnesium: 1.8 mg/dL (ref 1.7–2.4)

## 2020-06-29 SURGERY — ECHOCARDIOGRAM, TRANSESOPHAGEAL
Anesthesia: Moderate Sedation

## 2020-06-29 MED ORDER — FENTANYL CITRATE (PF) 100 MCG/2ML IJ SOLN
INTRAMUSCULAR | Status: AC | PRN
  Administered 2020-06-29: 50 ug via INTRAVENOUS

## 2020-06-29 MED ORDER — IPRATROPIUM-ALBUTEROL 0.5-2.5 (3) MG/3ML IN SOLN: 3.0000 mL | Freq: Four times a day (QID) | RESPIRATORY_TRACT | Status: DC | PRN

## 2020-06-29 MED ORDER — SODIUM CHLORIDE FLUSH 0.9 % IV SOLN
INTRAVENOUS | Status: AC
  Filled 2020-06-29: qty 10

## 2020-06-29 MED ORDER — FENTANYL CITRATE (PF) 100 MCG/2ML IJ SOLN
INTRAMUSCULAR | Status: AC
  Filled 2020-06-29: qty 2

## 2020-06-29 MED ORDER — LIDOCAINE VISCOUS HCL 2 % MT SOLN
OROMUCOSAL | Status: AC
  Filled 2020-06-29: qty 15

## 2020-06-29 MED ORDER — AZITHROMYCIN 250 MG PO TABS: 500.0000 mg | ORAL_TABLET | Freq: Every day | ORAL | Status: DC

## 2020-06-29 MED ORDER — BUTAMBEN-TETRACAINE-BENZOCAINE 2-2-14 % EX AERO
INHALATION_SPRAY | CUTANEOUS | Status: AC
  Filled 2020-06-29: qty 5

## 2020-06-29 MED ORDER — MIDAZOLAM HCL 5 MG/5ML IJ SOLN
INTRAMUSCULAR | Status: AC
  Filled 2020-06-29: qty 5

## 2020-06-29 MED ORDER — MIDAZOLAM HCL 5 MG/5ML IJ SOLN
INTRAMUSCULAR | Status: AC | PRN
  Administered 2020-06-29: 2 mg via INTRAVENOUS
  Administered 2020-06-29: 1 mg via INTRAVENOUS

## 2020-06-29 NOTE — Progress Notes (Addendum)
Progress Note    Madison Cuevas  ZOX:[id number] DOB: 23-May-1945  DOA: 06/26/2020 PCP: Kandyce Rud, MD      Brief Narrative:    Medical records reviewed and are as summarized below:  Madison Cuevas is a 75 y.o. female with medical history significant for type II DM, hypertension, CAD, dyslipidemia, recent EGD about a week prior to admission which showed acute gastritis.  She presented to the hospital with productive cough, wheezing and fever.  She was admitted to the hospital for sepsis secondary to pneumonia.  She was treated with empiric IV antibiotics and IV fluids.  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  ID was consulted to assist with management.  CTA also showed acute pulmonary embolism and venous duplex of the lower extremities showed left popliteal DVT.  There was no evidence of vegetations on 2D transthoracic echo so cardiologist was consulted for TEE.  There was no evidence of vegetations on TEE as well.  TEE showed protruding plaque in the transverse aorta.    Assessment/Plan:   Principal Problem:   Sepsis due to pneumonia [facility]) Active Problems:   Pasteurella infection   Bacteremia due to Gram-negative bacteria    Body mass index is 31.08 kg/m.  (Obesity)   Sepsis secondary to community-acquired pneumonia, Pasteurella multocida bacteremia (4 out of 4 bottles): She is on IV Unasyn.  No evidence of vegetations/infective endocarditis on TEE that was done on 06/29/2020.  No evidence of mitral stenosis on TEE but there is mild mitral regurgitation.  No vegetations on 2D echo.  EF was estimated at 60 to 65%.  Grade 1 diastolic dysfunction, mild to moderate mitral stenosis.  Follow-up repeat blood cultures.  Follow-up with ID for further recommendations.  Acute pulmonary embolism and left popliteal DVT: Continue Eliquis.  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  She is already on Eliquis.  Hypokalemia and hypomagnesemia: Replete potassium and magnesium respectively.  Monitor levels.  Type II DM with hyperglycemia: Increase Lantus to 18 units daily.  NovoLog as needed for hyperglycemia.  Home glipizide and Sitagliptin have been held.  Acute gastritis: Continue Protonix  Other comorbidities include depression, hypertension, hyperlipidemia, overactive bladder  Diet Order             Diet Carb Modified Fluid consistency: Thin; Room service appropriate? Yes  Diet effective now                      Consultants: Infectious disease Cardiologist  Procedures: TEE on 06/29/2020    Medications:    apixaban  10 mg Oral BID   [START ON 07/04/2020] apixaban  5 mg Oral BID   [START ON 06/30/2020] azithromycin  500 mg Oral Daily   butamben-tetracaine-benzocaine       fentaNYL       fesoterodine  4 mg Oral Daily   guaiFENesin  600 mg Oral BID   insulin aspart  0-15 Units Subcutaneous TID AC & HS   insulin glargine  15 Units Subcutaneous Daily   lidocaine       losartan  50 mg Oral Daily   midazolam       multivitamin with minerals  1 tablet Oral Daily   pantoprazole  40 mg Oral Daily   progesterone  100 mg Oral Daily   psyllium  1 packet Oral Daily   simvastatin  20  mg Oral Daily   sodium chloride flush       venlafaxine XR  75 mg Oral Daily   Continuous Infusions:  sodium chloride     ampicillin-sulbactam (UNASYN) IV 3 g (06/29/20 1009)     Anti-infectives (From admission, onward)    Start     Dose/Rate Route Frequency Ordered Stop   06/30/20 1000  azithromycin (ZITHROMAX) tablet 500 mg        500 mg Oral Daily 06/29/20 0946 07/02/20 0959   06/28/20 1500  Ampicillin-Sulbactam (UNASYN) 3 g in sodium chloride 0.9 % 100 mL IVPB        3 g 200 mL/hr over 30 Minutes Intravenous Every 6 hours 06/28/20 1358     06/27/20 1400  piperacillin-tazobactam (ZOSYN) IVPB 3.375 g  Status:   Discontinued        3.375 g 12.5 mL/hr over 240 Minutes Intravenous Every 8 hours 06/27/20 1226 06/28/20 1358   06/27/20 0700  cefTRIAXone (ROCEPHIN) 2 g in sodium chloride 0.9 % 100 mL IVPB  Status:  Discontinued        2 g 200 mL/hr over 30 Minutes Intravenous Every 24 hours 06/27/20 0234 06/27/20 1226   06/27/20 0245  azithromycin (ZITHROMAX) 500 mg in sodium chloride 0.9 % 250 mL IVPB  Status:  Discontinued        500 mg 250 mL/hr over 60 Minutes Intravenous Every 24 hours 06/27/20 0234 06/29/20 0946   06/27/20 0100  vancomycin (VANCOCIN) IVPB 1000 mg/200 mL premix        1,000 mg 200 mL/hr over 60 Minutes Intravenous  Once 06/27/20 0050 06/27/20 0451   06/27/20 0045  vancomycin (VANCOREADY) IVPB 2000 mg/400 mL  Status:  Discontinued        2,000 mg 200 mL/hr over 120 Minutes Intravenous  Once 06/27/20 0044 06/27/20 0050   06/27/20 0030  vancomycin (VANCOCIN) IVPB 1000 mg/200 mL premix  Status:  Discontinued        1,000 mg 200 mL/hr over 60 Minutes Intravenous  Once 06/27/20 0025 06/27/20 0312   06/27/20 0030  ceFEPIme (MAXIPIME) 2 g in sodium chloride 0.9 % 100 mL IVPB        2 g 200 mL/hr over 30 Minutes Intravenous  Once 06/27/20 0025 06/27/20 0132              Family Communication/Anticipated D/C date and plan/Code Status   DVT prophylaxis:  apixaban (ELIQUIS) tablet 10 mg  apixaban (ELIQUIS) tablet 5 mg     Code Status: Full Code  Family Communication: Husband at the bedside Disposition Plan:    Status is: Inpatient  Remains inpatient appropriate because:IV treatments appropriate due to intensity of illness or inability to take PO and Inpatient level of care appropriate due to severity of illness  Dispo: The patient is from: Home              Anticipated d/c is to: Home              Patient currently is not medically stable to d/c.   Difficult to place patient No           Subjective:   Interval events noted.  She feels a little better.  No  chest pain or shortness of breath.  She has an occasional cough.  Her husband was at the bedside.  Objective:    Vitals:   06/29/20 0941 06/29/20 1206 06/29/20 1303 06/29/20 1542  BP: (!) 136/91 (!) 143/75  (!) 141/79  Pulse: 88 93  92  Resp: 18 18  18   Temp: (!) 97.5 F (36.4 C) 97.6 F (36.4 C)  98 F (36.7 C)  TempSrc:    Oral  SpO2: 93% 97% 96% 94%  Weight:      Height:       No data found.   Intake/Output Summary (Last 24 hours) at 06/29/2020 1607 Last data filed at 06/29/2020 0541 Gross per 24 hour  Intake 813.96 ml  Output 1150 ml  Net -336.04 ml   Filed Weights   06/27/20 2341 06/28/20 0500 06/29/20 0447  Weight: 90.9 kg 90.7 kg 90 kg    Exam:  GEN: NAD SKIN: Warm and dry EYES: EOMI ENT: MMM CV: RRR PULM: CTA B ABD: soft, ND, NT, +BS CNS: AAO x 3, non focal EXT: No edema or tenderness       Data Reviewed:   I have personally reviewed following labs and imaging studies:  Labs: Labs show the following:   Basic Metabolic Panel: Recent Labs  Lab 06/26/20 2317 06/27/20 0504 06/28/20 0435 06/29/20 0221  NA 135 137 134* 140  K 4.1 4.1 3.2* 4.0  CL 103 103 102 106  CO2 23 29 28 26   GLUCOSE 258* 224* 193* 195*  BUN 18 13 9 11   CREATININE 0.89 0.85 0.76 0.82  CALCIUM 9.1 8.5* 8.5* 9.3  MG  --   --  1.6* 1.8  PHOS  --   --  3.4  --    GFR Estimated Creatinine Clearance: 67.3 mL/min (by C-G formula based on SCr of 0.82 mg/dL). Liver Function Tests: Recent Labs  Lab 06/26/20 2317  AST 22  ALT 13  ALKPHOS 46  BILITOT 0.6  PROT 7.4  ALBUMIN 4.3   No results for input(s): LIPASE, AMYLASE in the last 168 hours. No results for input(s): AMMONIA in the last 168 hours. Coagulation profile Recent Labs  Lab 06/26/20 2317  INR 1.0    CBC: Recent Labs  Lab 06/26/20 2317 06/27/20 0504 06/28/20 0435  WBC 12.9* 12.1* 9.1  NEUTROABS 11.1*  --   --   HGB 14.2 12.8 12.3  HCT 41.3 37.6 36.0  MCV 88.1 87.9 88.7  PLT 191 171 167    Cardiac Enzymes: No results for input(s): CKTOTAL, CKMB, CKMBINDEX, TROPONINI in the last 168 hours. BNP (last 3 results) No results for input(s): PROBNP in the last 8760 hours. CBG: Recent Labs  Lab 06/28/20 1621 06/28/20 2104 06/29/20 0739 06/29/20 0943 06/29/20 1207  GLUCAP 158* 206* 198* 211* 309*   D-Dimer: No results for input(s): DDIMER in the last 72 hours. Hgb A1c: Recent Labs    06/27/20 0504  HGBA1C 7.9*   Lipid Profile: No results for input(s): CHOL, HDL, LDLCALC, TRIG, CHOLHDL, LDLDIRECT in the last 72 hours. Thyroid function studies: No results for input(s): TSH, T4TOTAL, T3FREE, THYROIDAB in the last 72 hours.  Invalid input(s): FREET3 Anemia work up: No results for input(s): VITAMINB12, FOLATE, FERRITIN, TIBC, IRON, RETICCTPCT in the last 72 hours. Sepsis Labs: Recent Labs  Lab 06/26/20 2317 06/27/20 0200 06/27/20 0504 06/28/20 0435  PROCALCITON <0.10  --  0.82 0.83  WBC 12.9*  --  12.1* 9.1  LATICACIDVEN 2.5* 1.7  --   --     Microbiology Recent Results (from the past 240 hour(s))  Culture, blood (Routine x 2)     Status: Abnormal   Collection Time: 06/26/20 11:17 PM   Specimen: BLOOD  Result Value Ref Range Status  Specimen Description   Final    BLOOD RIGHT FOREARM Performed at [facility], [address] [place], [place] [address]    Special Requests   Final    BOTTLES DRAWN AEROBIC AND ANAEROBIC Blood Culture results may not be optimal due to an excessive volume of blood received in culture bottles Performed at [facility], [address], [place], [place] [address]    Culture  Setup Time   Final    GRAM NEGATIVE RODS IN BOTH AEROBIC AND ANAEROBIC BOTTLES CRITICAL VALUE NOTED.  VALUE IS CONSISTENT WITH PREVIOUSLY REPORTED AND CALLED VALUE. Performed at [facility], [address] [place], [place] [address]    Culture (A)  Final    PASTEURELLA MULTOCIDA Usually susceptible to penicillin and  other beta lactam agents,quinolones,macrolides and tetracyclines. Performed at [facility], [address] [place], [place] [address]    Report Status 06/29/2020 FINAL  Final  Resp Panel by RT-PCR (Flu A&B, Covid) Nasopharyngeal Swab     Status: None   Collection Time: 06/26/20 11:18 PM   Specimen: Nasopharyngeal Swab; Nasopharyngeal(NP) swabs in vial transport medium  Result Value Ref Range Status   SARS Coronavirus 2 by RT PCR NEGATIVE NEGATIVE Final    Comment: (NOTE) SARS-CoV-2 target nucleic acids are NOT DETECTED.  The SARS-CoV-2 RNA is generally detectable in upper respiratory specimens during the acute phase of infection. The lowest concentration of SARS-CoV-2 viral copies this assay can detect is 138 copies/mL. A negative result does not preclude SARS-Cov-2 infection and should not be used as the sole basis for treatment or other patient management decisions. A negative result may occur with  improper specimen collection/handling, submission of specimen other than nasopharyngeal swab, presence of viral mutation(s) within the areas targeted by this assay, and inadequate number of viral copies(<138 copies/mL). A negative result must be combined with clinical observations, patient history, and epidemiological information. The expected result is Negative.  Fact Sheet for Patients:  BloggerCourse.com  Fact Sheet for Healthcare Providers:  SeriousBroker.it  This test is no t yet approved or cleared by the [place] FDA and  has been authorized for detection and/or diagnosis of SARS-CoV-2 by FDA under an Emergency Use Authorization (EUA). This EUA will remain  in effect (meaning this test can be used) for the duration of the COVID-19 declaration under Section 564(b)(1) of the Act, 21 U.S.C.section 360bbb-3(b)(1), unless the authorization is terminated  or revoked sooner.       Influenza A by PCR NEGATIVE NEGATIVE  Final   Influenza B by PCR NEGATIVE NEGATIVE Final    Comment: (NOTE) The Xpert Xpress SARS-CoV-2/FLU/RSV plus assay is intended as an aid in the diagnosis of influenza from Nasopharyngeal swab specimens and should not be used as a sole basis for treatment. Nasal washings and aspirates are unacceptable for Xpert Xpress SARS-CoV-2/FLU/RSV testing.  Fact Sheet for Patients: BloggerCourse.com  Fact Sheet for Healthcare Providers: SeriousBroker.it  This test is not yet approved or cleared by the [place] FDA and has been authorized for detection and/or diagnosis of SARS-CoV-2 by FDA under an Emergency Use Authorization (EUA). This EUA will remain in effect (meaning this test can be used) for the duration of the COVID-19 declaration under Section 564(b)(1) of the Act, 21 U.S.C. section 360bbb-3(b)(1), unless the authorization is terminated or revoked.  Performed at [facility] [address] [place], [place] [address]   Culture, blood (Routine x 2)     Status: Abnormal  Collection Time: 06/27/20 12:25 AM   Specimen: BLOOD  Result Value Ref Range Status   Specimen Description   Final    BLOOD LEFT ANTECUBITAL Performed at [facility], [address] [place], [place] [address]    Special Requests   Final    BOTTLES DRAWN AEROBIC AND ANAEROBIC Blood Culture adequate volume Performed at [facility], [address] [place], [place] [address]    Culture  Setup Time   Final    GRAM NEGATIVE RODS IN BOTH AEROBIC AND ANAEROBIC BOTTLES CRITICAL RESULT CALLED TO, READ BACK BY AND VERIFIED WITH: DEVIN MITCHELL, PHARMD AT 1134 ON 06/27/20 BY GM    Culture (A)  Final    PASTEURELLA MULTOCIDA Usually susceptible to penicillin and other beta lactam agents,quinolones,macrolides and tetracyclines. Performed at [facility], [address] [place], [place] [address]    Report Status 06/29/2020 FINAL   Final  Blood Culture ID Panel (Reflexed)     Status: None   Collection Time: 06/27/20 12:25 AM  Result Value Ref Range Status   Enterococcus faecalis NOT DETECTED NOT DETECTED Final   Enterococcus Faecium NOT DETECTED NOT DETECTED Final   Listeria monocytogenes NOT DETECTED NOT DETECTED Final   Staphylococcus species NOT DETECTED NOT DETECTED Final   Staphylococcus aureus (BCID) NOT DETECTED NOT DETECTED Final   Staphylococcus epidermidis NOT DETECTED NOT DETECTED Final   Staphylococcus lugdunensis NOT DETECTED NOT DETECTED Final   Streptococcus species NOT DETECTED NOT DETECTED Final   Streptococcus agalactiae NOT DETECTED NOT DETECTED Final   Streptococcus pneumoniae NOT DETECTED NOT DETECTED Final   Streptococcus pyogenes NOT DETECTED NOT DETECTED Final   A.calcoaceticus-baumannii NOT DETECTED NOT DETECTED Final   Bacteroides fragilis NOT DETECTED NOT DETECTED Final   Enterobacterales NOT DETECTED NOT DETECTED Final   Enterobacter cloacae complex NOT DETECTED NOT DETECTED Final   Escherichia coli NOT DETECTED NOT DETECTED Final   Klebsiella aerogenes NOT DETECTED NOT DETECTED Final   Klebsiella oxytoca NOT DETECTED NOT DETECTED Final   Klebsiella pneumoniae NOT DETECTED NOT DETECTED Final   Proteus species NOT DETECTED NOT DETECTED Final   Salmonella species NOT DETECTED NOT DETECTED Final   Serratia marcescens NOT DETECTED NOT DETECTED Final   Haemophilus influenzae NOT DETECTED NOT DETECTED Final   Neisseria meningitidis NOT DETECTED NOT DETECTED Final   Pseudomonas aeruginosa NOT DETECTED NOT DETECTED Final   Stenotrophomonas maltophilia NOT DETECTED NOT DETECTED Final   Candida albicans NOT DETECTED NOT DETECTED Final   Candida auris NOT DETECTED NOT DETECTED Final   Candida glabrata NOT DETECTED NOT DETECTED Final   Candida krusei NOT DETECTED NOT DETECTED Final   Candida parapsilosis NOT DETECTED NOT DETECTED Final   Candida tropicalis NOT DETECTED NOT DETECTED Final    Cryptococcus neoformans/gattii NOT DETECTED NOT DETECTED Final    Comment: Performed at [facility], [address] [place], [place] [address]  Urine Culture     Status: None   Collection Time: 06/27/20  1:41 AM   Specimen: Urine, Catheterized  Result Value Ref Range Status   Specimen Description   Final    URINE, CATHETERIZED Performed at [facility], [address], [place], [place] [address]    Special Requests   Final    NONE Performed at [facility], [address] [place], [place] [address]    Culture   Final    NO GROWTH Performed at [facility], [address] [place], [place] [address]  Report Status 06/28/2020 FINAL  Final  Respiratory (~20 pathogens) panel by PCR     Status: None   Collection Time: 06/28/20  2:24 PM   Specimen: Nasopharyngeal Swab; Respiratory  Result Value Ref Range Status   Adenovirus NOT DETECTED NOT DETECTED Final   Coronavirus 229E NOT DETECTED NOT DETECTED Final    Comment: (NOTE) The Coronavirus on the Respiratory Panel, DOES NOT test for the novel  Coronavirus (2019 nCoV)    Coronavirus HKU1 NOT DETECTED NOT DETECTED Final   Coronavirus NL63 NOT DETECTED NOT DETECTED Final   Coronavirus OC43 NOT DETECTED NOT DETECTED Final   Metapneumovirus NOT DETECTED NOT DETECTED Final   Rhinovirus / Enterovirus NOT DETECTED NOT DETECTED Final   Influenza A NOT DETECTED NOT DETECTED Final   Influenza B NOT DETECTED NOT DETECTED Final   Parainfluenza Virus 1 NOT DETECTED NOT DETECTED Final   Parainfluenza Virus 2 NOT DETECTED NOT DETECTED Final   Parainfluenza Virus 3 NOT DETECTED NOT DETECTED Final   Parainfluenza Virus 4 NOT DETECTED NOT DETECTED Final   Respiratory Syncytial Virus NOT DETECTED NOT DETECTED Final   Bordetella pertussis NOT DETECTED NOT DETECTED Final   Bordetella Parapertussis NOT DETECTED NOT DETECTED Final   Chlamydophila pneumoniae NOT DETECTED NOT DETECTED Final   Mycoplasma pneumoniae  NOT DETECTED NOT DETECTED Final    Comment: Performed at [facility], [address] [place], [place] [address]  CULTURE, BLOOD (ROUTINE X 2) w Reflex to ID Panel     Status: None (Preliminary result)   Collection Time: 06/29/20  2:20 AM   Specimen: BLOOD  Result Value Ref Range Status   Specimen Description BLOOD LEFT ANTECUBITAL  Final   Special Requests   Final    BOTTLES DRAWN AEROBIC AND ANAEROBIC Blood Culture adequate volume   Culture   Final    NO GROWTH < 12 HOURS Performed at [facility], [address] [place], [place] [address]    Report Status PENDING  Incomplete  CULTURE, BLOOD (ROUTINE X 2) w Reflex to ID Panel     Status: None (Preliminary result)   Collection Time: 06/29/20  2:21 AM   Specimen: BLOOD  Result Value Ref Range Status   Specimen Description BLOOD RIGHT ANTECUBITAL  Final   Special Requests   Final    BOTTLES DRAWN AEROBIC AND ANAEROBIC Blood Culture adequate volume   Culture   Final    NO GROWTH < 12 HOURS Performed at [facility], [address] [place], [place] [address]    Report Status PENDING  Incomplete    Procedures and diagnostic studies:  US Venous Img Lower Bilateral (DVT)  Result Date: 06/29/2020 CLINICAL DATA:  Lower extremity pain. Recent diagnosis of pulmonary embolism. Evaluate for DVT. EXAM: BILATERAL LOWER EXTREMITY VENOUS DOPPLER ULTRASOUND TECHNIQUE: Gray-scale sonography with graded compression, as well as color Doppler and duplex ultrasound were performed to evaluate the lower extremity deep venous systems from the level of the common femoral vein and including the common femoral, femoral, profunda femoral, popliteal and calf veins including the posterior tibial, peroneal and gastrocnemius veins when visible. The superficial great saphenous vein was also interrogated. Spectral Doppler was utilized to evaluate flow at rest and with distal augmentation maneuvers in the common femoral, femoral and  popliteal veins. COMPARISON:  None. FINDINGS: RIGHT LOWER EXTREMITY Common Femoral Vein: No evidence of thrombus. Normal compressibility, respiratory phasicity and response to augmentation. Saphenofemoral Junction: No evidence of thrombus. Normal compressibility and flow on  color Doppler imaging. Profunda Femoral Vein: No evidence of thrombus. Normal compressibility and flow on color Doppler imaging. Femoral Vein: No evidence of thrombus. Normal compressibility, respiratory phasicity and response to augmentation. Popliteal Vein: No evidence of thrombus. Normal compressibility, respiratory phasicity and response to augmentation. Calf Veins: No evidence of thrombus. Normal compressibility and flow on color Doppler imaging. Superficial Great Saphenous Vein: No evidence of thrombus. Normal compressibility. Other Findings:  None. LEFT LOWER EXTREMITY Common Femoral Vein: No evidence of thrombus. Normal compressibility, respiratory phasicity and response to augmentation. Saphenofemoral Junction: No evidence of thrombus. Normal compressibility and flow on color Doppler imaging. Profunda Femoral Vein: No evidence of thrombus. Normal compressibility and flow on color Doppler imaging. Femoral Vein: No evidence of thrombus. Normal compressibility, respiratory phasicity and response to augmentation. Popliteal Vein: While the proximal aspect of the left popliteal vein appears widely patent, there is occlusive hypoechoic thrombus within one of the duplicated segments of the distal aspect of the left popliteal vein (images 53 and 54). Calf Veins: No evidence of thrombus. Normal compressibility and flow on color Doppler imaging. Superficial Great Saphenous Vein: No evidence of thrombus. Normal compressibility. Other Findings:  None. IMPRESSION: 1. Examination is positive for short segment occlusive DVT involving one of the duplicated segments of the distal aspect of the left popliteal vein. 2. No evidence of DVT within the right  lower extremity. Electronically Signed   By: Simonne Come M.D.   On: 06/29/2020 07:20   ECHO TEE  Result Date: 06/29/2020    TRANSESOPHOGEAL ECHO REPORT   Patient Name:   Madison Cuevas Date of Exam: 06/29/2020 Medical Rec #:  [id number]      Height:       67.0 in Accession #:    [id number]     Weight:       198.4 lb Date of Birth:  [birth year]      BSA:          2.015 m Patient Age:    75 years       BP:           120/78 mmHg Patient Gender: F              HR:           88 bpm. Exam Location:  [facility] Procedure: Transesophageal Echo Indications:    endocarditis  History:        Patient has prior history of Echocardiogram examinations.                 Endocarditis.  Sonographer:    JERRY Referring Phys: [id number] Lamar Blinks PROCEDURE: The transesophogeal probe was passed without difficulty through the esophogus of the patient. Sedation performed by performing physician. The patient developed no complications during the procedure. IMPRESSIONS  1. Left ventricular ejection fraction, by estimation, is 60 to 65%. The left ventricle has normal function. The left ventricle has no regional wall motion abnormalities.  2. Right ventricular systolic function is normal. The right ventricular size is normal.  3. No left atrial/left atrial appendage thrombus was detected.  4. The mitral valve is normal in structure. Mild mitral valve regurgitation.  5. The aortic valve is normal in structure. Aortic valve regurgitation is trivial.  6. 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  7. Agitated saline contrast bubble study was negative, with no evidence of any interatrial shunt. FINDINGS  Left Ventricle: Left ventricular ejection fraction, by estimation, is 60 to 65%. The  left ventricle has normal function. The left ventricle has no regional wall motion abnormalities. The left ventricular internal cavity size was normal in size. Right Ventricle: The right ventricular size is normal. No increase in  right ventricular wall thickness. Right ventricular systolic function is normal. Left Atrium: Left atrial size was normal in size. No left atrial/left atrial appendage thrombus was detected. Right Atrium: Right atrial size was normal in size. Pericardium: There is no evidence of pericardial effusion. Mitral Valve: The mitral valve is normal in structure. Mild mitral valve regurgitation. There is no evidence of mitral valve vegetation. Tricuspid Valve: The tricuspid valve is normal in structure. Tricuspid valve regurgitation is trivial. There is no evidence of tricuspid valve vegetation. Aortic Valve: The aortic valve is normal in structure. Aortic valve regurgitation is trivial. There is no evidence of aortic valve vegetation. Pulmonic Valve: The pulmonic valve was normal in structure. Pulmonic valve regurgitation is trivial. Aorta: The aortic root and ascending aorta are structurally normal, with no evidence of dilitation. There is moderate (Grade III) protruding plaque involving the transverse aorta. IAS/Shunts: No atrial level shunt detected by color flow Doppler. Agitated saline contrast was given intravenously to evaluate for intracardiac shunting. Agitated saline contrast bubble study was negative, with no evidence of any interatrial shunt. There  is no evidence of a patent foramen ovale. There is no evidence of an atrial septal defect. Arnoldo HookerBruce Kowalski MD Electronically signed by Arnoldo HookerBruce Kowalski MD Signature Date/Time: 06/29/2020/8:39:18 AM    Final                LOS: 2 days   Lurene ShadowBERNARD Kamariah Fruchter  [facility]   Pager on [email or web address]. If 7PM-7AM, please contact night-coverage at [email or web address]     06/29/2020, 4:07 PM

## 2020-06-29 NOTE — Progress Notes (Signed)
Date of Admission:  06/26/2020   Total days of antibiotics -3           ID: Madison Cuevas is a 75 y.o. female Active Problems:   Sepsis due to pneumonia [facility])    Subjective: Feeling better No fever  Medications:   apixaban  10 mg Oral BID   [START ON 07/04/2020] apixaban  5 mg Oral BID   [START ON 06/30/2020] azithromycin  500 mg Oral Daily   butamben-tetracaine-benzocaine       fentaNYL       fesoterodine  4 mg Oral Daily   guaiFENesin  600 mg Oral BID   insulin aspart  0-15 Units Subcutaneous TID AC & HS   insulin glargine  15 Units Subcutaneous Daily   lidocaine       losartan  50 mg Oral Daily   midazolam       multivitamin with minerals  1 tablet Oral Daily   pantoprazole  40 mg Oral Daily   progesterone  100 mg Oral Daily   psyllium  1 packet Oral Daily   simvastatin  20 mg Oral Daily   sodium chloride flush       venlafaxine XR  75 mg Oral Daily    Objective: Vital signs in last 24 hours: Temp:  [97.5 F (36.4 C)-98.8 F (37.1 C)] 97.6 F (36.4 C) (06/23 1206) Pulse Rate:  [86-102] 93 (06/23 1206) Resp:  [14-24] 18 (06/23 1206) BP: (131-173)/(63-93) 143/75 (06/23 1206) SpO2:  [89 %-99 %] 96 % (06/23 1303) Weight:  [90 kg] 90 kg (06/23 0447)  PHYSICAL EXAM:  General: Alert, cooperative, no distress, appears stated age.  Head: Normocephalic, without obvious abnormality, atraumatic. Eyes: Conjunctivae clear, anicteric sclerae. Pupils are equal ENT Nares normal. No drainage or sinus tenderness. Lips, mucosa, and tongue normal. No Thrush Neck: Supple, symmetrical, no adenopathy, thyroid: non tender no carotid bruit and no JVD. Back: No CVA tenderness. Lungs: b/l air entry- crepts bases. Heart: Regular rate and rhythm, no murmur, rub or gallop. Abdomen: Soft, non-tender,not distended. Bowel sounds normal. No masses Extremities: atraumatic, no cyanosis. No edema. No clubbing Skin: No rashes or lesions. Or bruising Lymph: Cervical, supraclavicular  normal. Neurologic: Grossly non-focal  Lab Results Recent Labs    06/27/20 0504 06/28/20 0435 06/29/20 0221  WBC 12.1* 9.1  --   HGB 12.8 12.3  --   HCT 37.6 36.0  --   NA 137 134* 140  K 4.1 3.2* 4.0  CL 103 102 106  CO2 29 28 26   BUN 13 9 11   CREATININE 0.85 0.76 0.82   Liver Panel Recent Labs    06/26/20 2317  PROT 7.4  ALBUMIN 4.3  AST 22  ALT 13  ALKPHOS 46  BILITOT 0.6   Sedimentation Rate No results for input(s): ESRSEDRATE in the last 72 hours. C-Reactive Protein No results for input(s): CRP in the last 72 hours.  Microbiology:  Studies/Results: Venous Img Lower Bilateral (DVT)  Result Date: 06/29/2020 CLINICAL DATA:  Lower extremity pain. Recent diagnosis of pulmonary embolism. Evaluate for DVT. EXAM: BILATERAL LOWER EXTREMITY VENOUS DOPPLER ULTRASOUND TECHNIQUE: Gray-scale sonography with graded compression, as well as color Doppler and duplex ultrasound were performed to evaluate the lower extremity deep venous systems from the level of the common femoral vein and including the common femoral, femoral, profunda femoral, popliteal and calf veins including the posterior tibial, peroneal and gastrocnemius veins when visible. The superficial great saphenous vein was also interrogated. Spectral  Doppler was utilized to evaluate flow at rest and with distal augmentation maneuvers in the common femoral, femoral and popliteal veins. COMPARISON:  None. FINDINGS: RIGHT LOWER EXTREMITY Common Femoral Vein: No evidence of thrombus. Normal compressibility, respiratory phasicity and response to augmentation. Saphenofemoral Junction: No evidence of thrombus. Normal compressibility and flow on color Doppler imaging. Profunda Femoral Vein: No evidence of thrombus. Normal compressibility and flow on color Doppler imaging. Femoral Vein: No evidence of thrombus. Normal compressibility, respiratory phasicity and response to augmentation. Popliteal Vein: No evidence of thrombus. Normal  compressibility, respiratory phasicity and response to augmentation. Calf Veins: No evidence of thrombus. Normal compressibility and flow on color Doppler imaging. Superficial Great Saphenous Vein: No evidence of thrombus. Normal compressibility. Other Findings:  None. LEFT LOWER EXTREMITY Common Femoral Vein: No evidence of thrombus. Normal compressibility, respiratory phasicity and response to augmentation. Saphenofemoral Junction: No evidence of thrombus. Normal compressibility and flow on color Doppler imaging. Profunda Femoral Vein: No evidence of thrombus. Normal compressibility and flow on color Doppler imaging. Femoral Vein: No evidence of thrombus. Normal compressibility, respiratory phasicity and response to augmentation. Popliteal Vein: While the proximal aspect of the left popliteal vein appears widely patent, there is occlusive hypoechoic thrombus within one of the duplicated segments of the distal aspect of the left popliteal vein (images 53 and 54). Calf Veins: No evidence of thrombus. Normal compressibility and flow on color Doppler imaging. Superficial Great Saphenous Vein: No evidence of thrombus. Normal compressibility. Other Findings:  None. IMPRESSION: 1. Examination is positive for short segment occlusive DVT involving one of the duplicated segments of the distal aspect of the left popliteal vein. 2. No evidence of DVT within the right lower extremity. Electronically Signed   By: Simonne Come M.D.   On: 06/29/2020 07:20   ECHO TEE  Result Date: 06/29/2020    TRANSESOPHOGEAL ECHO REPORT   Patient Name:   Madison Cuevas Date of Exam: 06/29/2020 Medical Rec #:  [id number]      Height:       67.0 in Accession #:    [id number]     Weight:       198.4 lb Date of Birth:  1945/10/07      BSA:          2.015 m Patient Age:    75 years       BP:           120/78 mmHg Patient Gender: F              HR:           88 bpm. Exam Location:  [facility] Procedure: Transesophageal Echo Indications:    endocarditis   History:        Patient has prior history of Echocardiogram examinations.                 Endocarditis.  Sonographer:    JERRY Referring Phys: [id number] Lamar Blinks PROCEDURE: The transesophogeal probe was passed without difficulty through the esophogus of the patient. Sedation performed by performing physician. The patient developed no complications during the procedure. IMPRESSIONS  1. Left ventricular ejection fraction, by estimation, is 60 to 65%. The left ventricle has normal function. The left ventricle has no regional wall motion abnormalities.  2. Right ventricular systolic function is normal. The right ventricular size is normal.  3. No left atrial/left atrial appendage thrombus was detected.  4. The mitral valve is normal in structure. Mild mitral valve regurgitation.  5. The  aortic valve is normal in structure. Aortic valve regurgitation is trivial.  6. 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  7. Agitated saline contrast bubble study was negative, with no evidence of any interatrial shunt. FINDINGS  Left Ventricle: Left ventricular ejection fraction, by estimation, is 60 to 65%. The left ventricle has normal function. The left ventricle has no regional wall motion abnormalities. The left ventricular internal cavity size was normal in size. Right Ventricle: The right ventricular size is normal. No increase in right ventricular wall thickness. Right ventricular systolic function is normal. Left Atrium: Left atrial size was normal in size. No left atrial/left atrial appendage thrombus was detected. Right Atrium: Right atrial size was normal in size. Pericardium: There is no evidence of pericardial effusion. Mitral Valve: The mitral valve is normal in structure. Mild mitral valve regurgitation. There is no evidence of mitral valve vegetation. Tricuspid Valve: The tricuspid valve is normal in structure. Tricuspid valve regurgitation is trivial. There is no evidence of  tricuspid valve vegetation. Aortic Valve: The aortic valve is normal in structure. Aortic valve regurgitation is trivial. There is no evidence of aortic valve vegetation. Pulmonic Valve: The pulmonic valve was normal in structure. Pulmonic valve regurgitation is trivial. Aorta: The aortic root and ascending aorta are structurally normal, with no evidence of dilitation. There is moderate (Grade III) protruding plaque involving the transverse aorta. IAS/Shunts: No atrial level shunt detected by color flow Doppler. Agitated saline contrast was given intravenously to evaluate for intracardiac shunting. Agitated saline contrast bubble study was negative, with no evidence of any interatrial shunt. There  is no evidence of a patent foramen ovale. There is no evidence of an atrial septal defect. 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 Has a cat and a dog at home.  Even though there were no animal bites there was scratching and licking. TEE no endocarditis Repeat blood culture sent Currently on unasyn On discharge will switch to levaquin until 07/06/20  Acute encephalopathy has resolved  Pulmonary embolism on Eliquis.  Doppler shows occlusive DVT involving one of the duplicated segments of the distal aspect of the left popliteal vein.  Discussed the management with the patient and care team

## 2020-06-29 NOTE — Progress Notes (Signed)
[facility]iology [facility] Encounter Note  Patient: Madison Cuevas / Admit Date: 06/26/2020 / Date of Encounter: 06/29/2020, 8:50 AM   Subjective: Patient doing well at this time with no evidence of significant congestive heart failure or anginal symptoms stable from the diabetes coronary artery disease and hypertension and hyperlipidemia standpoint.  She is getting appropriate medication management for her infection.  Transesophageal echocardiogram showing normal LV systolic function with ejection fraction of 65% and no evidence of valvular vegetation or endocarditis.  She does have mild to moderate aortic atherosclerosis  Review of Systems: Positive for: None Negative for: Vision change, hearing change, syncope, dizziness, nausea, vomiting,diarrhea, bloody stool, stomach pain, cough, congestion, diaphoresis, urinary frequency, urinary pain,skin lesions, skin rashes Others previously listed  Objective: Telemetry: Normal sinus rhythm Physical Exam: Blood pressure 139/85, pulse 86, temperature 97.8 F (36.6 C), temperature source Oral, resp. rate 18, height  (1.702 m), weight 90 kg, SpO2 97 %. Body mass index is 31.08 kg/m. General: Well developed, well nourished, in no acute distress. Head: Normocephalic, atraumatic, sclera non-icteric, no xanthomas, nares are without discharge. Neck: No apparent masses Lungs: Normal respirations with no wheezes, no rhonchi, no rales , no crackles   Heart: Regular rate and rhythm, normal S1 S2, no murmur, no rub, no gallop, PMI is normal size and placement, carotid upstroke normal without bruit, jugular venous pressure normal Abdomen: Soft, non-tender, non-distended with normoactive bowel sounds. No hepatosplenomegaly. Abdominal aorta is normal size without bruit Extremities: No edema, no clubbing, no cyanosis, no ulcers,  Peripheral: 2+ radial, 2+ femoral, 2+ dorsal pedal pulses Neuro: Alert and oriented. Moves all extremities  spontaneously. Psych:  Responds to questions appropriately with a normal affect.   Intake/Output Summary (Last 24 hours) at 06/29/2020 0850 Last data filed at 06/29/2020 0541 Gross per 24 hour  Intake 1293.96 ml  Output 1400 ml  Net -106.04 ml    Inpatient Medications:  . apixaban  10 mg Oral BID  . [START ON 07/04/2020] apixaban  5 mg Oral BID  . butamben-tetracaine-benzocaine      . fentaNYL      . fesoterodine  4 mg Oral Daily  . guaiFENesin  600 mg Oral BID  . insulin aspart  0-15 Units Subcutaneous TID AC & HS  . insulin glargine  15 Units Subcutaneous Daily  . ipratropium-albuterol  3 mL Nebulization TID  . lidocaine      . losartan  50 mg Oral Daily  . midazolam      . multivitamin with minerals  1 tablet Oral Daily  . pantoprazole  40 mg Oral Daily  . progesterone  100 mg Oral Daily  . psyllium  1 packet Oral Daily  . simvastatin  20 mg Oral Daily  . sodium chloride flush      . venlafaxine XR  75 mg Oral Daily   Infusions:  . sodium chloride    . ampicillin-sulbactam (UNASYN) IV Stopped (06/29/20 0442)  . azithromycin 250 mL/hr at 06/29/20 0541    Labs: Recent Labs    06/28/20 0435 06/29/20 0221  NA 134* 140  K 3.2* 4.0  CL 102 106  CO2 28 26  GLUCOSE 193* 195*  BUN 9 11  CREATININE 0.76 0.82  CALCIUM 8.5* 9.3  MG 1.6* 1.8  PHOS 3.4  --    Recent Labs    06/26/20 2317  AST 22  ALT 13  ALKPHOS 46  BILITOT 0.6  PROT 7.4  ALBUMIN 4.3   Recent Labs  06/26/20 2317 06/27/20 0504 06/28/20 0435  WBC 12.9* 12.1* 9.1  NEUTROABS 11.1*  --   --   HGB 14.2 12.8 12.3  HCT 41.3 37.6 36.0  MCV 88.1 87.9 88.7  PLT 191 171 167   No results for input(s): CKTOTAL, CKMB, TROPONINI in the last 72 hours. Invalid input(s): POCBNP Recent Labs    06/27/20 0504  HGBA1C 7.9*     Weights: Filed Weights   06/27/20 2341 06/28/20 0500 06/29/20 0447  Weight: 90.9 kg 90.7 kg 90 kg     Radiology/Studies:  CT Angio Chest PE W and/or Wo  Contrast  Result Date: 06/27/2020 CLINICAL DATA:  Chest pain.  Shortness of breath.  Cough and fever. EXAM: CT ANGIOGRAPHY CHEST WITH CONTRAST TECHNIQUE: Multidetector CT imaging of the chest was performed using the standard protocol during bolus administration of intravenous contrast. Multiplanar CT image reconstructions and MIPs were obtained to evaluate the vascular anatomy. CONTRAST:  75mL OMNIPAQUE IOHEXOL 350 MG/ML SOLN COMPARISON:  None. FINDINGS: Cardiovascular: Satisfactory opacification of the pulmonary arteries to the segmental level. Motion artifact diminishes exam detail. There is a single tiny (3 mm) filling defect within the right lower lobar pulmonary artery, equivocal for clinically significant pulmonary embolus, image 53/4. Heart size is within normal limits. No pericardial effusion. Aortic atherosclerosis. Lad coronary artery calcifications. Mediastinum/Nodes: Normal appearance of the thyroid gland. The trachea appears patent and is midline. Normal appearance of the esophagus. No enlarged lymph nodes Lungs/Pleura: Rounded airspace consolidation with surrounding ground-glass attenuation is noted within the right middle lobe measuring approximately 3.8 cm. No pleural effusion or edema. 4 mm perifissural nodule is identified. Upper Abdomen: No acute abnormality Musculoskeletal: No chest wall abnormality. No acute or significant osseous findings. Review of the MIP images confirms the above findings. IMPRESSION: 1. Motion artifact diminishes exam detail. 2. There is a single tiny (3 mm) filling defect within the right lower lobar pulmonary artery, equivocal for clinically significant pulmonary embolus. 3. Rounded airspace consolidation with surrounding ground-glass attenuation is identified within the right middle lobe. Findings are favored to represent an area of pneumonia. Follow-up imaging is advised to ensure resolution and exclude underlying malignancy. 4. Coronary artery calcifications noted.  5. Aortic atherosclerosis. Aortic Atherosclerosis (ICD10-I70.0). Electronically Signed   By: Signa Kellaylor  Stroud M.D.   On: 06/27/2020 01:42   US Venous Img Lower Bilateral (DVT)  Result Date: 06/29/2020 CLINICAL DATA:  Lower extremity pain. Recent diagnosis of pulmonary embolism. Evaluate for DVT. EXAM: BILATERAL LOWER EXTREMITY VENOUS DOPPLER ULTRASOUND TECHNIQUE: Gray-scale sonography with graded compression, as well as color Doppler and duplex ultrasound were performed to evaluate the lower extremity deep venous systems from the level of the common femoral vein and including the common femoral, femoral, profunda femoral, popliteal and calf veins including the posterior tibial, peroneal and gastrocnemius veins when visible. The superficial great saphenous vein was also interrogated. Spectral Doppler was utilized to evaluate flow at rest and with distal augmentation maneuvers in the common femoral, femoral and popliteal veins. COMPARISON:  None. FINDINGS: RIGHT LOWER EXTREMITY Common Femoral Vein: No evidence of thrombus. Normal compressibility, respiratory phasicity and response to augmentation. Saphenofemoral Junction: No evidence of thrombus. Normal compressibility and flow on color Doppler imaging. Profunda Femoral Vein: No evidence of thrombus. Normal compressibility and flow on color Doppler imaging. Femoral Vein: No evidence of thrombus. Normal compressibility, respiratory phasicity and response to augmentation. Popliteal Vein: No evidence of thrombus. Normal compressibility, respiratory phasicity and response to augmentation. Calf Veins: No evidence of thrombus. Normal compressibility  and flow on color Doppler imaging. Superficial Great Saphenous Vein: No evidence of thrombus. Normal compressibility. Other Findings:  None. LEFT LOWER EXTREMITY Common Femoral Vein: No evidence of thrombus. Normal compressibility, respiratory phasicity and response to augmentation. Saphenofemoral Junction: No evidence of  thrombus. Normal compressibility and flow on color Doppler imaging. Profunda Femoral Vein: No evidence of thrombus. Normal compressibility and flow on color Doppler imaging. Femoral Vein: No evidence of thrombus. Normal compressibility, respiratory phasicity and response to augmentation. Popliteal Vein: While the proximal aspect of the left popliteal vein appears widely patent, there is occlusive hypoechoic thrombus within one of the duplicated segments of the distal aspect of the left popliteal vein (images 53 and 54). Calf Veins: No evidence of thrombus. Normal compressibility and flow on color Doppler imaging. Superficial Great Saphenous Vein: No evidence of thrombus. Normal compressibility. Other Findings:  None. IMPRESSION: 1. Examination is positive for short segment occlusive DVT involving one of the duplicated segments of the distal aspect of the left popliteal vein. 2. No evidence of DVT within the right lower extremity. Electronically Signed   By: Simonne Come M.D.   On: 06/29/2020 07:20   DG Chest Port 1 View  Result Date: 06/26/2020 CLINICAL DATA:  Fever. EXAM: PORTABLE CHEST 1 VIEW COMPARISON:  Chest x-ray 05/08/2011 FINDINGS: The heart size and mediastinal contours are unchanged. Aortic calcification. Interval development of a 4 x 3 cm density at the right costocardiac angle of unclear etiology. No pulmonary edema. No pleural effusion. No pneumothorax. No acute osseous abnormality. IMPRESSION: Interval development of a 4 x 3 cm density at the right costocardiac angle of unclear etiology. Recommend CT chest with intravenous contrast for further evaluation. Electronically Signed   By: Tish Frederickson M.D.   On: 06/26/2020 23:37   ECHOCARDIOGRAM COMPLETE  Result Date: 06/27/2020    ECHOCARDIOGRAM REPORT   Patient Name:   Madison Cuevas Date of Exam: 06/27/2020 Medical Rec #:  [id number]      Height:       67.0 in Accession #:    [id number]     Weight:       198.0 lb Date of Birth:  10-26-1945       BSA:          2.014 m Patient Age:    75 years       BP:           144/61 mmHg Patient Gender: F              HR:           110 bpm. Exam Location:  [facility] Procedure: 2D Echo, Color Doppler and Cardiac Doppler Indications:     I26.09 Pulmonary Embolus  History:         Patient has no prior history of Echocardiogram examinations.                  Signs/Symptoms:Fever and Cough; Risk Factors:Hypertension,                  Diabetes and Dyslipidemia.  Sonographer:     Humphrey Rolls RDCS (AE) Referring Phys:  [id number] Vernetta Honey MANSY Diagnosing Phys: Yvonne Kendall MD  Sonographer Comments: Global longitudinal strain was attempted. IMPRESSIONS  1. Left ventricular ejection fraction, by estimation, is 60 to 65%. The left ventricle has normal function. Left ventricular endocardial border not optimally defined to evaluate regional wall motion. Left ventricular diastolic parameters are consistent with Grade I diastolic dysfunction (impaired relaxation). The  average left ventricular global longitudinal strain is -14.2 %. The global longitudinal strain is abnormal.  2. Right ventricular systolic function is normal. The right ventricular size is normal.  3. The mitral valve is abnormal. Trivial mitral valve regurgitation. Mild to moderate mitral stenosis. The mean mitral valve gradient is 6.0 mmHg.  4. The aortic valve is tricuspid. There is mild calcification of the aortic valve. There is mild thickening of the aortic valve. Aortic valve regurgitation is not visualized. Mild to moderate aortic valve sclerosis/calcification is present, without any evidence of aortic stenosis.  5. The inferior vena cava is normal in size with greater than 50% respiratory variability, suggesting right atrial pressure of 3 mmHg. FINDINGS  Left Ventricle: Left ventricular ejection fraction, by estimation, is 60 to 65%. The left ventricle has normal function. Left ventricular endocardial border not optimally defined to evaluate regional wall motion. The  average left ventricular global longitudinal strain is -14.2 %. The global longitudinal strain is abnormal. The left ventricular internal cavity size was normal in size. There is borderline left ventricular hypertrophy. Left ventricular diastolic parameters are consistent with Grade I diastolic dysfunction (impaired relaxation). Right Ventricle: The right ventricular size is normal. No increase in right ventricular wall thickness. Right ventricular systolic function is normal. Left Atrium: Left atrial size was normal in size. Right Atrium: Right atrial size was normal in size. Pericardium: There is no evidence of pericardial effusion. Mitral Valve: The mitral valve is abnormal. There is mild thickening of the mitral valve leaflet(s). There is mild calcification of the mitral valve leaflet(s). Trivial mitral valve regurgitation. Mild to moderate mitral valve stenosis. MV peak gradient,  12.2 mmHg. The mean mitral valve gradient is 6.0 mmHg. Tricuspid Valve: The tricuspid valve is not well visualized. Tricuspid valve regurgitation is not demonstrated. Aortic Valve: The aortic valve is tricuspid. There is mild calcification of the aortic valve. There is mild thickening of the aortic valve. There is mild aortic valve annular calcification. Aortic valve regurgitation is not visualized. Mild to moderate aortic valve sclerosis/calcification is present, without any evidence of aortic stenosis. Aortic valve mean gradient measures 9.0 mmHg. Aortic valve peak gradient measures 15.4 mmHg. Aortic valve area, by VTI measures 2.03 cm. Pulmonic Valve: The pulmonic valve was grossly normal. Pulmonic valve regurgitation is not visualized. No evidence of pulmonic stenosis. Aorta: The aortic root is normal in size and structure. Pulmonary Artery: The pulmonary artery is of normal size. Venous: The inferior vena cava is normal in size with greater than 50% respiratory variability, suggesting right atrial pressure of 3 mmHg. IAS/Shunts:  The interatrial septum was not well visualized.  LEFT VENTRICLE PLAX 2D LVIDd:         4.60 cm  Diastology LVIDs:         3.00 cm  LV e' medial:    7.51 cm/s LV PW:         1.00 cm  LV E/e' medial:  14.5 LV IVS:        0.90 cm  LV e' lateral:   8.92 cm/s LVOT diam:     2.10 cm  LV E/e' lateral: 12.2 LV SV:         73 LV SV Index:   36       2D Longitudinal Strain LVOT Area:     3.46 cm 2D Strain GLS Avg:     -14.2 %  RIGHT VENTRICLE RV Basal diam:  2.80 cm RV Mid diam:    3.70 cm RV S  prime:     12.50 cm/s LEFT ATRIUM             Index       RIGHT ATRIUM           Index LA diam:        3.60 cm 1.79 cm/m  RA Area:     12.40 cm LA Vol (A2C):   38.3 ml 19.02 ml/m RA Volume:   26.60 ml  13.21 ml/m LA Vol (A4C):   27.2 ml 13.51 ml/m LA Biplane Vol: 33.6 ml 16.69 ml/m  AORTIC VALVE                    PULMONIC VALVE AV Area (Vmax):    2.07 cm     PV Vmax:       1.08 m/s AV Area (Vmean):   2.16 cm     PV Vmean:      80.800 cm/s AV Area (VTI):     2.03 cm     PV VTI:        0.182 m AV Vmax:           196.00 cm/s  PV Peak grad:  4.7 mmHg AV Vmean:          143.000 cm/s PV Mean grad:  3.0 mmHg AV VTI:            0.359 m AV Peak Grad:      15.4 mmHg AV Mean Grad:      9.0 mmHg LVOT Vmax:         117.00 cm/s LVOT Vmean:        89.200 cm/s LVOT VTI:          0.210 m LVOT/AV VTI ratio: 0.58  AORTA Ao Root diam: 3.20 cm MITRAL VALVE MV Area (PHT): 7.16 cm     SHUNTS MV Area VTI:   2.56 cm     Systemic VTI:  0.21 m MV Peak grad:  12.2 mmHg    Systemic Diam: 2.10 cm MV Mean grad:  6.0 mmHg MV Vmax:       1.75 m/s MV Vmean:      111.0 cm/s MV Decel Time: 106 msec MV E velocity: 109.00 cm/s MV A velocity: 151.00 cm/s MV E/A ratio:  0.72 Cristal Deer End MD Electronically signed by Yvonne Kendall MD Signature Date/Time: 06/27/2020/11:24:38 AM    Final    ECHO TEE  Result Date: 06/29/2020    TRANSESOPHOGEAL ECHO REPORT   Patient Name:   Madison Cuevas Date of Exam: 06/29/2020 Medical Rec #:  [id number]      Height:        67.0 in Accession #:    [id number]     Weight:       198.4 lb Date of Birth:  1945-05-07      BSA:          2.015 m Patient Age:    75 years       BP:           120/78 mmHg Patient Gender: F              HR:           88 bpm. Exam Location:  [facility] Procedure: Transesophageal Echo Indications:    endocarditis  History:        Patient has prior history of Echocardiogram examinations.  Endocarditis.  Sonographer:    JERRY Referring Phys: [id number] Lamar Blinks PROCEDURE: The transesophogeal probe was passed without difficulty through the esophogus of the patient. Sedation performed by performing physician. The patient developed no complications during the procedure. IMPRESSIONS  1. Left ventricular ejection fraction, by estimation, is 60 to 65%. The left ventricle has normal function. The left ventricle has no regional wall motion abnormalities.  2. Right ventricular systolic function is normal. The right ventricular size is normal.  3. No left atrial/left atrial appendage thrombus was detected.  4. The mitral valve is normal in structure. Mild mitral valve regurgitation.  5. The aortic valve is normal in structure. Aortic valve regurgitation is trivial.  6. 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  7. Agitated saline contrast bubble study was negative, with no evidence of any interatrial shunt. FINDINGS  Left Ventricle: Left ventricular ejection fraction, by estimation, is 60 to 65%. The left ventricle has normal function. The left ventricle has no regional wall motion abnormalities. The left ventricular internal cavity size was normal in size. Right Ventricle: The right ventricular size is normal. No increase in right ventricular wall thickness. Right ventricular systolic function is normal. Left Atrium: Left atrial size was normal in size. No left atrial/left atrial appendage thrombus was detected. Right Atrium: Right atrial size was normal in size. Pericardium:  There is no evidence of pericardial effusion. Mitral Valve: The mitral valve is normal in structure. Mild mitral valve regurgitation. There is no evidence of mitral valve vegetation. Tricuspid Valve: The tricuspid valve is normal in structure. Tricuspid valve regurgitation is trivial. There is no evidence of tricuspid valve vegetation. Aortic Valve: The aortic valve is normal in structure. Aortic valve regurgitation is trivial. There is no evidence of aortic valve vegetation. Pulmonic Valve: The pulmonic valve was normal in structure. Pulmonic valve regurgitation is trivial. Aorta: The aortic root and ascending aorta are structurally normal, with no evidence of dilitation. There is moderate (Grade III) protruding plaque involving the transverse aorta. IAS/Shunts: No atrial level shunt detected by color flow Doppler. Agitated saline contrast was given intravenously to evaluate for intracardiac shunting. Agitated saline contrast bubble study was negative, with no evidence of any interatrial shunt. There  is no evidence of a patent foramen ovale. There is no evidence of an atrial septal defect. Arnoldo Hooker MD Electronically signed by Arnoldo Hooker MD Signature Date/Time: 06/29/2020/8:39:18 AM    Final      Assessment and Recommendation  75 y.o. female with known diabetes hypertension hyperlipidemia coronary artery disease having acute sepsis and infection of unknown etiology without current evidence of congestive heart failure myocardial infarction or evidence of endocarditis by TEE 1.  Continue supportive care for infection with antibiotics 2.  No further cardiac intervention or diagnostics necessary at this time due to no current evidence of endocarditis 3.  Continue supportive care and other medication management for cardiovascular risk factors 4.  Further follow-up as outpatient after patient recovered from inpatient issues as per above  Signed, Arnoldo Hooker M.D. FACC

## 2020-06-29 NOTE — CV Procedure (Signed)
Transesophageal echocardiogram preliminary report  Madison Cuevas [id number] 1945/11/03  Preliminary diagnosis  Bacteremia with possible endocarditis  Postprocedural diagnosis Normal LV function without evidence of endocarditis or vegetation  Time out A timeout was performed by the nursing staff and physicians specifically identifying the procedure performed, identification of the patient, the type of sedation, all allergies and medications, all pertinent medical history, and presedation assessment of nasopharynx. The patient and or family understand the risks of the procedure including the rare risks of death, stroke, heart attack, esophogeal perforation, sore throat, and reaction to medications given.  Moderate sedation During this procedure the patient has received Versed 3 milligrams and fentanyl 50 micrograms to achieve appropriate moderate sedation.  The patient had continued monitoring of heart rate, oxygenation, blood pressure, respiratory rate, and extent of signs of sedation throughout the entire procedure.  The patient received this moderate sedation over a period of 9 minutes.  Both the nursing staff and I were present during the procedure when the patient had moderate sedation for 100% of the time.  Treatment considerations  No additional treatment considerations needed for bacteremia due to no current evidence of endocarditis  For further details of transesophageal echocardiogram please refer to final report.  Signed,  Lamar Blinks M.D. [facility] 06/29/2020 8:53 AM

## 2020-06-29 NOTE — Progress Notes (Signed)
*  PRELIMINARY RESULTS* Echocardiogram 2D Echocardiogram has been performed.  Cristela Blue 06/29/2020, 8:24 AM

## 2020-06-30 ENCOUNTER — Encounter: Payer: Self-pay | Admitting: Internal Medicine

## 2020-06-30 DIAGNOSIS — I4581 Long QT syndrome: Secondary | ICD-10-CM

## 2020-06-30 DIAGNOSIS — A280 Pasteurellosis: Secondary | ICD-10-CM

## 2020-06-30 LAB — GLUCOSE, CAPILLARY
Glucose-Capillary: 201 mg/dL — ABNORMAL HIGH (ref 70–99)
Glucose-Capillary: 217 mg/dL — ABNORMAL HIGH (ref 70–99)
Glucose-Capillary: 209 mg/dL — ABNORMAL HIGH (ref 70–99)
Glucose-Capillary: 157 mg/dL — ABNORMAL HIGH (ref 70–99)

## 2020-06-30 LAB — LEGIONELLA PNEUMOPHILA SEROGP 1 UR AG: L. pneumophila Serogp 1 Ur Ag: NEGATIVE

## 2020-06-30 MED ORDER — SODIUM CHLORIDE 0.9 % IV SOLN
2.0000 g | INTRAVENOUS | Status: DC
  Administered 2020-07-01: 2 g via INTRAVENOUS
  Filled 2020-06-30: qty 20

## 2020-06-30 MED ORDER — SODIUM CHLORIDE 0.9% FLUSH: 10.0000 mL | INTRAVENOUS | Status: DC | PRN

## 2020-06-30 MED ORDER — METRONIDAZOLE 500 MG PO TABS
500.0000 mg | ORAL_TABLET | Freq: Two times a day (BID) | ORAL | Status: DC
  Administered 2020-07-01: 500 mg via ORAL
  Filled 2020-06-30 (×2): qty 1

## 2020-06-30 MED ORDER — SODIUM CHLORIDE 0.9% FLUSH
10.0000 mL | Freq: Two times a day (BID) | INTRAVENOUS | Status: DC
  Administered 2020-06-30 – 2020-07-01 (×2): 10 mL

## 2020-06-30 NOTE — TOC Progression Note (Signed)
Transition of Care [facility]) - Progression Note    Patient Details  Name: Madison Cuevas MRN: [id number] Date of Birth: December 13, 1945  [facility] CM/SW Contact  Hetty Ely, RN Phone Number: 06/30/2020, 3:40 PM  Clinical Narrative: Called and spoke with Jeri Modena who is on her way to assess patient and will set up IV Abx. Therapy to include contacting [facility] Nurse to infuse abx that will begin Sunday.          Expected Discharge Plan and Services                                                 Social Determinants of Health (SDOH) Interventions    Readmission Risk Interventions No flowsheet data found.

## 2020-06-30 NOTE — Care Management Important Message (Signed)
Important Message  Patient Details  Name: Madison Cuevas MRN: [id number] Date of Birth: 19-Sep-1945   Medicare Important Message Given:  Yes  Reviewed Medicare IM with patient via room phone due to isolation status.  Copy of Medicare IM sent securely to email address provided: [email or web address].    Johnell Comings 06/30/2020, 3:14 PM

## 2020-06-30 NOTE — Progress Notes (Signed)
PHARMACY CONSULT NOTE FOR:  OUTPATIENT  PARENTERAL ANTIBIOTIC THERAPY (OPAT)  Indication: pasteurella multocida bacteremia Regimen: ceftriaxone 2gm IV q24h  Plus po metronidazole End date: 07/06/2020  IV antibiotic discharge orders are pended. To discharging provider:  please sign these orders via discharge navigator,  Select New Orders & click on the button choice - Manage This Unsigned Work.     Thank you for allowing pharmacy to be a part of this patient's care.  Juliette Alcide, PharmD, BCPS.   Work Cell: [phone] 06/30/2020 3:29 PM

## 2020-06-30 NOTE — Progress Notes (Signed)
Progress Note    Madison Cuevas  ZOX:[id number] DOB: 11/11/1945  DOA: 06/26/2020 PCP: Madison RudBabaoff, Marcus, MD      Brief Narrative:    Medical records reviewed and are as summarized below:  Madison Cuevas is a 75 y.o. female with medical history significant for type II DM, hypertension, CAD, dyslipidemia, recent EGD about a week prior to admission which showed acute gastritis.  She presented to the hospital with productive cough, wheezing and fever.  She was admitted to the hospital for sepsis secondary to pneumonia.  She was treated with empiric IV antibiotics and IV fluids.  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  ID was consulted to assist with management.  CTA also showed acute pulmonary embolism and venous duplex of the lower extremities showed left popliteal DVT.  There was no evidence of vegetations on 2D transthoracic echo so cardiologist was consulted for TEE.  There was no evidence of vegetations on TEE as well.  TEE showed protruding plaque in the transverse aorta.    Assessment/Plan:   Principal Problem:   Sepsis due to pneumonia [facility](HCC) Active Problems:   Pasteurella infection   Bacteremia due to Gram-negative bacteria    Body mass index is 30.54 kg/m.  (Obesity)   Sepsis secondary to community-acquired pneumonia, Pasteurella multocida bacteremia (4 out of 4 bottles): She has a cat and a dog at home.  Even though she has not been bitten by her pets, she has had multiple scratches from her pets.  Case was discussed with Madison Cuevas, ID specialist.  Plan was to discharge patient on oral Levaquin.  However, patient has prolonged QTc interval so Madison Cuevas decided against the use of Levaquin at discharge.  She recommended IV ceftriaxone and oral Flagyl through 07/06/2020.  Midline catheter for home IV antibiotics has been ordered.  No growth on repeat blood cultures thus far.  No evidence of vegetations/infective endocarditis on TEE that was done on  06/29/2020.  No evidence of mitral stenosis on TEE but there is mild mitral regurgitation.  No vegetations on 2D echo.  EF was estimated at 60 to 65%.  Grade 1 diastolic dysfunction, mild to moderate mitral stenosis.   Acute pulmonary embolism and left popliteal DVT: Continue Eliquis.  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  She is already on Eliquis.  Hypokalemia and hypomagnesemia: Improved  Type II DM with hyperglycemia: Continue Lantus.  NovoLog as needed for hyperglycemia.  Home glipizide and Sitagliptin have been held.  Acute gastritis: Continue Protonix  Other comorbidities include depression, hypertension, hyperlipidemia, overactive bladder  Diet Order             Diet Carb Modified Fluid consistency: Thin; Room service appropriate? Yes  Diet effective now                      Consultants: Infectious disease Cardiologist  Procedures: TEE on 06/29/2020    Medications:    apixaban  10 mg Oral BID   [START ON 07/04/2020] apixaban  5 mg Oral BID   fesoterodine  4 mg Oral Daily   guaiFENesin  600 mg Oral BID   insulin aspart  0-15 Units Subcutaneous TID AC & HS   insulin glargine  15 Units Subcutaneous Daily   losartan  50 mg Oral Daily   multivitamin with minerals  1 tablet Oral Daily  pantoprazole  40 mg Oral Daily   progesterone  100 mg Oral Daily   psyllium  1 packet Oral Daily   simvastatin  20 mg Oral Daily   venlafaxine XR  75 mg Oral Daily   Continuous Infusions:  sodium chloride     ampicillin-sulbactam (UNASYN) IV 3 g (06/30/20 1419)     Anti-infectives (From admission, onward)    Start     Dose/Rate Route Frequency Ordered Stop   06/30/20 1000  azithromycin (ZITHROMAX) tablet 500 mg  Status:  Discontinued        500 mg Oral Daily 06/29/20 0946 06/29/20 1611   06/28/20 1500  Ampicillin-Sulbactam (UNASYN) 3 g in sodium  chloride 0.9 % 100 mL IVPB        3 g 200 mL/hr over 30 Minutes Intravenous Every 6 hours 06/28/20 1358     06/27/20 1400  piperacillin-tazobactam (ZOSYN) IVPB 3.375 g  Status:  Discontinued        3.375 g 12.5 mL/hr over 240 Minutes Intravenous Every 8 hours 06/27/20 1226 06/28/20 1358   06/27/20 0700  cefTRIAXone (ROCEPHIN) 2 g in sodium chloride 0.9 % 100 mL IVPB  Status:  Discontinued        2 g 200 mL/hr over 30 Minutes Intravenous Every 24 hours 06/27/20 0234 06/27/20 1226   06/27/20 0245  azithromycin (ZITHROMAX) 500 mg in sodium chloride 0.9 % 250 mL IVPB  Status:  Discontinued        500 mg 250 mL/hr over 60 Minutes Intravenous Every 24 hours 06/27/20 0234 06/29/20 0946   06/27/20 0100  vancomycin (VANCOCIN) IVPB 1000 mg/200 mL premix        1,000 mg 200 mL/hr over 60 Minutes Intravenous  Once 06/27/20 0050 06/27/20 0451   06/27/20 0045  vancomycin (VANCOREADY) IVPB 2000 mg/400 mL  Status:  Discontinued        2,000 mg 200 mL/hr over 120 Minutes Intravenous  Once 06/27/20 0044 06/27/20 0050   06/27/20 0030  vancomycin (VANCOCIN) IVPB 1000 mg/200 mL premix  Status:  Discontinued        1,000 mg 200 mL/hr over 60 Minutes Intravenous  Once 06/27/20 0025 06/27/20 0312   06/27/20 0030  ceFEPIme (MAXIPIME) 2 g in sodium chloride 0.9 % 100 mL IVPB        2 g 200 mL/hr over 30 Minutes Intravenous  Once 06/27/20 0025 06/27/20 0132              Family Communication/Anticipated D/C date and plan/Code Status   DVT prophylaxis:  apixaban (ELIQUIS) tablet 10 mg  apixaban (ELIQUIS) tablet 5 mg     Code Status: Full Code  Family Communication: Husband at the bedside Disposition Plan:    Status is: Inpatient  Remains inpatient appropriate because:IV treatments appropriate due to intensity of illness or inability to take PO and Inpatient level of care appropriate due to severity of illness  Dispo: The patient is from: Home              Anticipated d/c is to: Home               Patient currently is not medically stable to d/c.   Difficult to place patient No           Subjective:   Interval events noted.  She has no complaints.  Her husband was at the bedside.  Objective:    Vitals:   06/29/20 1928 06/30/20 0427 06/30/20 0851 06/30/20 1210  BP: Marland Kitchen)  148/81 139/73 132/78 138/76  Pulse: 98 91 88 86  Resp: Temp: 98 F (36.7 C) 98.4 F (36.9 C) 97.9 F (36.6 C) 98.1 F (36.7 C)  TempSrc: Oral Oral Oral Oral  SpO2: 95% 96% 98% 95%  Weight:  88.5 kg    Height:       No data found.   Intake/Output Summary (Last 24 hours) at 06/30/2020 1518 Last data filed at 06/30/2020 0427 Gross per 24 hour  Intake 200 ml  Output 300 ml  Net -100 ml   Filed Weights   06/28/20 0500 06/29/20 0447 06/30/20 0427  Weight: 90.7 kg 90 kg 88.5 kg    Exam:  GEN: NAD SKIN: Warm and dry EYES: EOMI ENT: MMM CV: RRR PULM: CTA B ABD: soft, ND, NT, +BS CNS: AAO x 3, non focal EXT: No edema or tenderness        Data Reviewed:   I have personally reviewed following labs and imaging studies:  Labs: Labs show the following:   Basic Metabolic Panel: Recent Labs  Lab 06/26/20 2317 06/27/20 0504 06/28/20 0435 06/29/20 0221  NA 135 137 134* 140  K 4.1 4.1 3.2* 4.0  CL 103 103 102 106  CO2 GLUCOSE 258* 224* 193* 195*  BUN CREATININE 0.89 0.85 0.76 0.82  CALCIUM 9.1 8.5* 8.5* 9.3  MG  --   --  1.6* 1.8  PHOS  --   --  3.4  --    GFR Estimated Creatinine Clearance: 66.7 mL/min (by C-G formula based on SCr of 0.82 mg/dL). Liver Function Tests: Recent Labs  Lab 06/26/20 2317  AST 22  ALT 13  ALKPHOS 46  BILITOT 0.6  PROT 7.4  ALBUMIN 4.3   No results for input(s): LIPASE, AMYLASE in the last 168 hours. No results for input(s): AMMONIA in the last 168 hours. Coagulation profile Recent Labs  Lab 06/26/20 2317  INR 1.0    CBC: Recent Labs  Lab 06/26/20 2317 06/27/20 0504 06/28/20 0435   WBC 12.9* 12.1* 9.1  NEUTROABS 11.1*  --   --   HGB 14.2 12.8 12.3  HCT 41.3 37.6 36.0  MCV 88.1 87.9 88.7  PLT 191 171 167   Cardiac Enzymes: No results for input(s): CKTOTAL, CKMB, CKMBINDEX, TROPONINI in the last 168 hours. BNP (last 3 results) No results for input(s): PROBNP in the last 8760 hours. CBG: Recent Labs  Lab 06/29/20 1207 06/29/20 1615 06/29/20 2129 06/30/20 0854 06/30/20 1353  GLUCAP 309* 194* 138* 201* 217*   D-Dimer: No results for input(s): DDIMER in the last 72 hours. Hgb A1c: No results for input(s): HGBA1C in the last 72 hours.  Lipid Profile: No results for input(s): CHOL, HDL, LDLCALC, TRIG, CHOLHDL, LDLDIRECT in the last 72 hours. Thyroid function studies: No results for input(s): TSH, T4TOTAL, T3FREE, THYROIDAB in the last 72 hours.  Invalid input(s): FREET3 Anemia work up: No results for input(s): VITAMINB12, FOLATE, FERRITIN, TIBC, IRON, RETICCTPCT in the last 72 hours. Sepsis Labs: Recent Labs  Lab 06/26/20 2317 06/27/20 0200 06/27/20 0504 06/28/20 0435  PROCALCITON <0.10  --  0.82 0.83  WBC 12.9*  --  12.1* 9.1  LATICACIDVEN 2.5* 1.7  --   --     Microbiology Recent Results (from the past 240 hour(s))  Culture, blood (Routine x 2)     Status: Abnormal   Collection Time: 06/26/20 11:17 PM   Specimen: BLOOD  Result Value Ref Range Status   Specimen Description   Final    BLOOD RIGHT FOREARM Performed at [facility], [address] [place], [place] [address]    Special Requests   Final    BOTTLES DRAWN AEROBIC AND ANAEROBIC Blood Culture results may not be optimal due to an excessive volume of blood received in culture bottles Performed at [facility], [address] [place], [place] [address]    Culture  Setup Time   Final    GRAM NEGATIVE RODS IN BOTH AEROBIC AND ANAEROBIC BOTTLES CRITICAL VALUE NOTED.  VALUE IS CONSISTENT WITH PREVIOUSLY REPORTED AND CALLED VALUE. Performed at [facility], [address] [place], [place] [address]    Culture (A)  Final    PASTEURELLA MULTOCIDA Usually susceptible to penicillin and other beta lactam agents,quinolones,macrolides and tetracyclines. Performed at [facility], [address] [place], [place] [address]    Report Status 06/29/2020 FINAL  Final  Resp Panel by RT-PCR (Flu A&B, Covid) Nasopharyngeal Swab     Status: None   Collection Time: 06/26/20 11:18 PM   Specimen: Nasopharyngeal Swab; Nasopharyngeal(NP) swabs in vial transport medium  Result Value Ref Range Status   SARS Coronavirus 2 by RT PCR NEGATIVE NEGATIVE Final    Comment: (NOTE) SARS-CoV-2 target nucleic acids are NOT DETECTED.  The SARS-CoV-2 RNA is generally detectable in upper respiratory specimens during the acute phase of infection. The lowest concentration of SARS-CoV-2 viral copies this assay can detect is 138 copies/mL. A negative result does not preclude SARS-Cov-2 infection and should not be used as the sole basis for treatment or other patient management decisions. A negative result may occur with  improper specimen collection/handling, submission of specimen other than nasopharyngeal swab, presence of viral mutation(s) within the areas targeted by this assay, and inadequate number of viral copies(<138 copies/mL). A negative result must be combined with clinical observations, patient history, and epidemiological information. The expected result is Negative.  Fact Sheet for Patients:  BloggerCourse.com  Fact Sheet for Healthcare Providers:  SeriousBroker.it  This test is no t yet approved or cleared by the [place] FDA and  has been authorized for detection and/or diagnosis of SARS-CoV-2 by FDA under an Emergency Use Authorization (EUA). This EUA will remain  in effect (meaning this test can be used) for the duration of the COVID-19 declaration under Section 564(b)(1) of the Act,  21 U.S.C.section 360bbb-3(b)(1), unless the authorization is terminated  or revoked sooner.       Influenza A by PCR NEGATIVE NEGATIVE Final   Influenza B by PCR NEGATIVE NEGATIVE Final    Comment: (NOTE) The Xpert Xpress SARS-CoV-2/FLU/RSV plus assay is intended as an aid in the diagnosis of influenza from Nasopharyngeal swab specimens and should not be used as a sole basis for treatment. Nasal washings and aspirates are unacceptable for Xpert Xpress SARS-CoV-2/FLU/RSV testing.  Fact Sheet for Patients: BloggerCourse.com  Fact Sheet for Healthcare Providers: SeriousBroker.it  This test is not yet approved or cleared by the [place] FDA and has been authorized for detection and/or diagnosis of SARS-CoV-2 by FDA under an Emergency Use Authorization (EUA). This EUA will remain in effect (meaning this test can be used) for the duration of the COVID-19 declaration under Section 564(b)(1) of the Act, 21 U.S.C. section 360bbb-3(b)(1), unless the authorization is terminated or revoked.  Performed at [facility], [address] [place], [place] [address]   Culture, blood (Routine x 2)  Status: Abnormal   Collection Time: 06/27/20 12:25 AM   Specimen: BLOOD  Result Value Ref Range Status   Specimen Description   Final    BLOOD LEFT ANTECUBITAL Performed at [facility], [address] [place], [place] [address]    Special Requests   Final    BOTTLES DRAWN AEROBIC AND ANAEROBIC Blood Culture adequate volume Performed at [facility], [address] [place], [place] [address]    Culture  Setup Time   Final    GRAM NEGATIVE RODS IN BOTH AEROBIC AND ANAEROBIC BOTTLES CRITICAL RESULT CALLED TO, READ BACK BY AND VERIFIED WITH: DEVIN MITCHELL, PHARMD AT 1134 ON 06/27/20 BY GM    Culture (A)  Final    PASTEURELLA MULTOCIDA Usually susceptible to penicillin and other beta lactam  agents,quinolones,macrolides and tetracyclines. Performed at [facility], [address] [place], [place] [address]    Report Status 06/29/2020 FINAL  Final  Blood Culture ID Panel (Reflexed)     Status: None   Collection Time: 06/27/20 12:25 AM  Result Value Ref Range Status   Enterococcus faecalis NOT DETECTED NOT DETECTED Final   Enterococcus Faecium NOT DETECTED NOT DETECTED Final   Listeria monocytogenes NOT DETECTED NOT DETECTED Final   Staphylococcus species NOT DETECTED NOT DETECTED Final   Staphylococcus aureus (BCID) NOT DETECTED NOT DETECTED Final   Staphylococcus epidermidis NOT DETECTED NOT DETECTED Final   Staphylococcus lugdunensis NOT DETECTED NOT DETECTED Final   Streptococcus species NOT DETECTED NOT DETECTED Final   Streptococcus agalactiae NOT DETECTED NOT DETECTED Final   Streptococcus pneumoniae NOT DETECTED NOT DETECTED Final   Streptococcus pyogenes NOT DETECTED NOT DETECTED Final   A.calcoaceticus-baumannii NOT DETECTED NOT DETECTED Final   Bacteroides fragilis NOT DETECTED NOT DETECTED Final   Enterobacterales NOT DETECTED NOT DETECTED Final   Enterobacter cloacae complex NOT DETECTED NOT DETECTED Final   Escherichia coli NOT DETECTED NOT DETECTED Final   Klebsiella aerogenes NOT DETECTED NOT DETECTED Final   Klebsiella oxytoca NOT DETECTED NOT DETECTED Final   Klebsiella pneumoniae NOT DETECTED NOT DETECTED Final   Proteus species NOT DETECTED NOT DETECTED Final   Salmonella species NOT DETECTED NOT DETECTED Final   Serratia marcescens NOT DETECTED NOT DETECTED Final   Haemophilus influenzae NOT DETECTED NOT DETECTED Final   Neisseria meningitidis NOT DETECTED NOT DETECTED Final   Pseudomonas aeruginosa NOT DETECTED NOT DETECTED Final   Stenotrophomonas maltophilia NOT DETECTED NOT DETECTED Final   Candida albicans NOT DETECTED NOT DETECTED Final   Candida auris NOT DETECTED NOT DETECTED Final   Candida glabrata NOT DETECTED NOT DETECTED Final    Candida krusei NOT DETECTED NOT DETECTED Final   Candida parapsilosis NOT DETECTED NOT DETECTED Final   Candida tropicalis NOT DETECTED NOT DETECTED Final   Cryptococcus neoformans/gattii NOT DETECTED NOT DETECTED Final    Comment: Performed at [facility], [address], [place], [place] [address]  Urine Culture     Status: None   Collection Time: 06/27/20  1:41 AM   Specimen: Urine, Catheterized  Result Value Ref Range Status   Specimen Description   Final    URINE, CATHETERIZED Performed at [facility], [address], [place], [place] [address]    Special Requests   Final    NONE Performed at [facility], [address], [place], [place] [address]    Culture   Final    NO GROWTH Performed at [facility], [address] [place], [place] [address]    Report Status 06/28/2020 FINAL  Final  [id number] (~20 pathogens) panel by PCR     Status: None   Collection Time: 06/28/20  2:24 PM   Specimen: Nasopharyngeal Swab; Respiratory  Result Value Ref Range Status   Adenovirus NOT DETECTED NOT DETECTED Final   Coronavirus 229E NOT DETECTED NOT DETECTED Final    Comment: (NOTE) The Coronavirus on the Respiratory Panel, DOES NOT test for the novel  Coronavirus (2019 nCoV)    Coronavirus HKU1 NOT DETECTED NOT DETECTED Final   Coronavirus NL63 NOT DETECTED NOT DETECTED Final   Coronavirus OC43 NOT DETECTED NOT DETECTED Final   Metapneumovirus NOT DETECTED NOT DETECTED Final   Rhinovirus / Enterovirus NOT DETECTED NOT DETECTED Final   Influenza A NOT DETECTED NOT DETECTED Final   Influenza B NOT DETECTED NOT DETECTED Final   Parainfluenza Virus 1 NOT DETECTED NOT DETECTED Final   Parainfluenza Virus 2 NOT DETECTED NOT DETECTED Final   Parainfluenza Virus 3 NOT DETECTED NOT DETECTED Final   Parainfluenza Virus 4 NOT DETECTED NOT DETECTED Final   Respiratory Syncytial Virus NOT DETECTED NOT DETECTED Final   Bordetella pertussis NOT DETECTED NOT  DETECTED Final   Bordetella Parapertussis NOT DETECTED NOT DETECTED Final   Chlamydophila pneumoniae NOT DETECTED NOT DETECTED Final   Mycoplasma pneumoniae NOT DETECTED NOT DETECTED Final    Comment: Performed at [facility], [address] [place], [place] [address]  CULTURE, BLOOD (ROUTINE X 2) w Reflex to ID Panel     Status: None (Preliminary result)   Collection Time: 06/29/20  2:20 AM   Specimen: BLOOD  Result Value Ref Range Status   Specimen Description BLOOD LEFT ANTECUBITAL  Final   Special Requests   Final    BOTTLES DRAWN AEROBIC AND ANAEROBIC Blood Culture adequate volume   Culture   Final    NO GROWTH 1 DAY Performed at [facility], [address], [place], [place] [address]    Report Status PENDING  Incomplete  CULTURE, BLOOD (ROUTINE X 2) w Reflex to ID Panel     Status: None (Preliminary result)   Collection Time: 06/29/20  2:21 AM   Specimen: BLOOD  Result Value Ref Range Status   Specimen Description BLOOD RIGHT ANTECUBITAL  Final   Special Requests   Final    BOTTLES DRAWN AEROBIC AND ANAEROBIC Blood Culture adequate volume   Culture   Final    NO GROWTH 1 DAY Performed at [facility], [address] [place], [place] [address]    Report Status PENDING  Incomplete    Procedures and diagnostic studies:  US Venous Img Lower Bilateral (DVT)  Result Date: 06/29/2020 CLINICAL DATA:  Lower extremity pain. Recent diagnosis of pulmonary embolism. Evaluate for DVT. EXAM: BILATERAL LOWER EXTREMITY VENOUS DOPPLER ULTRASOUND TECHNIQUE: Gray-scale sonography with graded compression, as well as color Doppler and duplex ultrasound were performed to evaluate the lower extremity deep venous systems from the level of the common femoral vein and including the common femoral, femoral, profunda femoral, popliteal and calf veins including the posterior tibial, peroneal and gastrocnemius veins when visible. The superficial great saphenous vein was also  interrogated. Spectral Doppler was utilized to evaluate flow at rest and with distal augmentation maneuvers in the common femoral, femoral and popliteal veins. COMPARISON:  None. FINDINGS: RIGHT LOWER EXTREMITY Common Femoral Vein: No evidence of thrombus. Normal compressibility, respiratory phasicity and response to augmentation. Saphenofemoral Junction: No evidence of thrombus.  Normal compressibility and flow on color Doppler imaging. Profunda Femoral Vein: No evidence of thrombus. Normal compressibility and flow on color Doppler imaging. Femoral Vein: No evidence of thrombus. Normal compressibility, respiratory phasicity and response to augmentation. Popliteal Vein: No evidence of thrombus. Normal compressibility, respiratory phasicity and response to augmentation. Calf Veins: No evidence of thrombus. Normal compressibility and flow on color Doppler imaging. Superficial Great Saphenous Vein: No evidence of thrombus. Normal compressibility. Other Findings:  None. LEFT LOWER EXTREMITY Common Femoral Vein: No evidence of thrombus. Normal compressibility, respiratory phasicity and response to augmentation. Saphenofemoral Junction: No evidence of thrombus. Normal compressibility and flow on color Doppler imaging. Profunda Femoral Vein: No evidence of thrombus. Normal compressibility and flow on color Doppler imaging. Femoral Vein: No evidence of thrombus. Normal compressibility, respiratory phasicity and response to augmentation. Popliteal Vein: While the proximal aspect of the left popliteal vein appears widely patent, there is occlusive hypoechoic thrombus within one of the duplicated segments of the distal aspect of the left popliteal vein (images 53 and 54). Calf Veins: No evidence of thrombus. Normal compressibility and flow on color Doppler imaging. Superficial Great Saphenous Vein: No evidence of thrombus. Normal compressibility. Other Findings:  None. IMPRESSION: 1. Examination is positive for short segment  occlusive DVT involving one of the duplicated segments of the distal aspect of the left popliteal vein. 2. No evidence of DVT within the right lower extremity. Electronically Signed   By: Simonne Come M.D.   On: 06/29/2020 07:20   ECHO TEE  Result Date: 06/29/2020    TRANSESOPHOGEAL ECHO REPORT   Patient Name:   Madison Cuevas Date of Exam: 06/29/2020 Medical Rec #:  [id number]      Height:       67.0 in Accession #:    [id number]     Weight:       198.4 lb Date of Birth:  02/02/1945      BSA:          2.015 m Patient Age:    75 years       BP:           120/78 mmHg Patient Gender: F              HR:           88 bpm. Exam Location:  [facility] Procedure: Transesophageal Echo Indications:    endocarditis  History:        Patient has prior history of Echocardiogram examinations.                 Endocarditis.  Sonographer:    JERRY Referring Phys: [id number] Lamar Blinks PROCEDURE: The transesophogeal probe was passed without difficulty through the esophogus of the patient. Sedation performed by performing physician. The patient developed no complications during the procedure. IMPRESSIONS  1. Left ventricular ejection fraction, by estimation, is 60 to 65%. The left ventricle has normal function. The left ventricle has no regional wall motion abnormalities.  2. Right ventricular systolic function is normal. The right ventricular size is normal.  3. No left atrial/left atrial appendage thrombus was detected.  4. The mitral valve is normal in structure. Mild mitral valve regurgitation.  5. The aortic valve is normal in structure. Aortic valve regurgitation is trivial.  6. 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  7. Agitated saline contrast bubble study was negative, with no evidence of any interatrial shunt. FINDINGS  Left Ventricle: Left ventricular ejection fraction, by estimation,  is 60 to 65%. The left ventricle has normal function. The left ventricle has no regional wall motion  abnormalities. The left ventricular internal cavity size was normal in size. Right Ventricle: The right ventricular size is normal. No increase in right ventricular wall thickness. Right ventricular systolic function is normal. Left Atrium: Left atrial size was normal in size. No left atrial/left atrial appendage thrombus was detected. Right Atrium: Right atrial size was normal in size. Pericardium: There is no evidence of pericardial effusion. Mitral Valve: The mitral valve is normal in structure. Mild mitral valve regurgitation. There is no evidence of mitral valve vegetation. Tricuspid Valve: The tricuspid valve is normal in structure. Tricuspid valve regurgitation is trivial. There is no evidence of tricuspid valve vegetation. Aortic Valve: The aortic valve is normal in structure. Aortic valve regurgitation is trivial. There is no evidence of aortic valve vegetation. Pulmonic Valve: The pulmonic valve was normal in structure. Pulmonic valve regurgitation is trivial. Aorta: The aortic root and ascending aorta are structurally normal, with no evidence of dilitation. There is moderate (Grade III) protruding plaque involving the transverse aorta. IAS/Shunts: No atrial level shunt detected by color flow Doppler. Agitated saline contrast was given intravenously to evaluate for intracardiac shunting. Agitated saline contrast bubble study was negative, with no evidence of any interatrial shunt. There  is no evidence of a patent foramen ovale. There is no evidence of an atrial septal defect. Arnoldo Hooker MD Electronically signed by Arnoldo Hooker MD Signature Date/Time: 06/29/2020/8:39:18 AM    Final                LOS: 3 days   Lurene Shadow  [facility]   Pager on [email or web address]. If 7PM-7AM, please contact night-coverage at [email or web address]     06/30/2020, 3:18 PM

## 2020-06-30 NOTE — Plan of Care (Signed)

## 2020-06-30 NOTE — Progress Notes (Signed)
Date of Admission:  06/26/2020    ID: Madison Cuevas is a 75 y.o. female  Principal Problem:   Sepsis due to pneumonia [facility]) Active Problems:   Pasteurella infection   Bacteremia due to Gram-negative bacteria    Subjective: Doing better No fever Cough improved  Medications:   apixaban  10 mg Oral BID   [START ON 07/04/2020] apixaban  5 mg Oral BID   fesoterodine  4 mg Oral Daily   guaiFENesin  600 mg Oral BID   insulin aspart  0-15 Units Subcutaneous TID AC & HS   insulin glargine  15 Units Subcutaneous Daily   losartan  50 mg Oral Daily   [START ON 07/01/2020] metroNIDAZOLE  500 mg Oral BID   multivitamin with minerals  1 tablet Oral Daily   pantoprazole  40 mg Oral Daily   progesterone  100 mg Oral Daily   psyllium  1 packet Oral Daily   simvastatin  20 mg Oral Daily   venlafaxine XR  75 mg Oral Daily    Objective: Vital signs in last 24 hours: Temp:  [97.9 F (36.6 C)-98.4 F (36.9 C)] 98.1 F (36.7 C) (06/24 1210) Pulse Rate:  [86-98] 86 (06/24 1210) Resp:  [18-20] 19 (06/24 1210) BP: (132-148)/(73-81) 138/76 (06/24 1210) SpO2:  [95 %-98 %] 95 % (06/24 1210) Weight:  [88.5 kg] 88.5 kg (06/24 0427)  PHYSICAL EXAM:  General: Alert, cooperative, no distress, appears stated age.  Head: Normocephalic, without obvious abnormality, atraumatic. Eyes: Conjunctivae clear, anicteric sclerae. Pupils are equal ENT Nares normal. No drainage or sinus tenderness. Lips, mucosa, and tongue normal. No Thrush Neck: Supple, symmetrical, no adenopathy, thyroid: non tender no carotid bruit and no JVD. Back: No CVA tenderness. Lungsb/l airne try-few crepts bases Heart: Regular rate and rhythm, no murmur, rub or gallop. Abdomen: Soft, non-tender,not distended. Bowel sounds normal. No masses Extremities: atraumatic, no cyanosis. No edema. No clubbing Skin: No rashes or lesions. Or bruising Lymph: Cervical, supraclavicular normal. Neurologic: Grossly non-focal  Lab  Results Recent Labs    06/28/20 0435 06/29/20 0221  WBC 9.1  --   HGB 12.3  --   HCT 36.0  --   NA 134* 140  K 3.2* 4.0  CL 102 106  CO2 28 26  BUN 9 11  CREATININE 0.76 0.82   Liver Panel No results for input(s): PROT, ALBUMIN, AST, ALT, ALKPHOS, BILITOT, BILIDIR, IBILI in the last 72 hours. Sedimentation Rate No results for input(s): ESRSEDRATE in the last 72 hours. C-Reactive Protein No results for input(s): CRP in the last 72 hours.  Microbiology:  Studies/Results: US Venous Img Lower Bilateral (DVT)  Result Date: 06/29/2020 CLINICAL DATA:  Lower extremity pain. Recent diagnosis of pulmonary embolism. Evaluate for DVT. EXAM: BILATERAL LOWER EXTREMITY VENOUS DOPPLER ULTRASOUND TECHNIQUE: Gray-scale sonography with graded compression, as well as color Doppler and duplex ultrasound were performed to evaluate the lower extremity deep venous systems from the level of the common femoral vein and including the common femoral, femoral, profunda femoral, popliteal and calf veins including the posterior tibial, peroneal and gastrocnemius veins when visible. The superficial great saphenous vein was also interrogated. Spectral Doppler was utilized to evaluate flow at rest and with distal augmentation maneuvers in the common femoral, femoral and popliteal veins. COMPARISON:  None. FINDINGS: RIGHT LOWER EXTREMITY Common Femoral Vein: No evidence of thrombus. Normal compressibility, respiratory phasicity and response to augmentation. Saphenofemoral Junction: No evidence of thrombus. Normal compressibility and flow on color Doppler imaging.  Profunda Femoral Vein: No evidence of thrombus. Normal compressibility and flow on color Doppler imaging. Femoral Vein: No evidence of thrombus. Normal compressibility, respiratory phasicity and response to augmentation. Popliteal Vein: No evidence of thrombus. Normal compressibility, respiratory phasicity and response to augmentation. Calf Veins: No evidence of  thrombus. Normal compressibility and flow on color Doppler imaging. Superficial Great Saphenous Vein: No evidence of thrombus. Normal compressibility. Other Findings:  None. LEFT LOWER EXTREMITY Common Femoral Vein: No evidence of thrombus. Normal compressibility, respiratory phasicity and response to augmentation. Saphenofemoral Junction: No evidence of thrombus. Normal compressibility and flow on color Doppler imaging. Profunda Femoral Vein: No evidence of thrombus. Normal compressibility and flow on color Doppler imaging. Femoral Vein: No evidence of thrombus. Normal compressibility, respiratory phasicity and response to augmentation. Popliteal Vein: While the proximal aspect of the left popliteal vein appears widely patent, there is occlusive hypoechoic thrombus within one of the duplicated segments of the distal aspect of the left popliteal vein (images 53 and 54). Calf Veins: No evidence of thrombus. Normal compressibility and flow on color Doppler imaging. Superficial Great Saphenous Vein: No evidence of thrombus. Normal compressibility. Other Findings:  None. IMPRESSION: 1. Examination is positive for short segment occlusive DVT involving one of the duplicated segments of the distal aspect of the left popliteal vein. 2. No evidence of DVT within the right lower extremity. Electronically Signed   By: Simonne Come M.D.   On: 06/29/2020 07:20   ECHO TEE  Result Date: 06/29/2020    TRANSESOPHOGEAL ECHO REPORT   Patient Name:   Madison Cuevas Date of Exam: 06/29/2020 Medical Rec #:  [id number]      Height:       67.0 in Accession #:    [id number]     Weight:       198.4 lb Date of Birth:  1945-11-21      BSA:          2.015 m Patient Age:    75 years       BP:           120/78 mmHg Patient Gender: F              HR:           88 bpm. Exam Location:  [facility] Procedure: Transesophageal Echo Indications:    endocarditis  History:        Patient has prior history of Echocardiogram examinations.                  Endocarditis.  Sonographer:    JERRY Referring Phys: [id number] Lamar Blinks PROCEDURE: The transesophogeal probe was passed without difficulty through the esophogus of the patient. Sedation performed by performing physician. The patient developed no complications during the procedure. IMPRESSIONS  1. Left ventricular ejection fraction, by estimation, is 60 to 65%. The left ventricle has normal function. The left ventricle has no regional wall motion abnormalities.  2. Right ventricular systolic function is normal. The right ventricular size is normal.  3. No left atrial/left atrial appendage thrombus was detected.  4. The mitral valve is normal in structure. Mild mitral valve regurgitation.  5. The aortic valve is normal in structure. Aortic valve regurgitation is trivial.  6. 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  7. Agitated saline contrast bubble study was negative, with no evidence of any interatrial shunt. FINDINGS  Left Ventricle: Left ventricular ejection fraction, by estimation, is 60 to 65%. The left ventricle  has normal function. The left ventricle has no regional wall motion abnormalities. The left ventricular internal cavity size was normal in size. Right Ventricle: The right ventricular size is normal. No increase in right ventricular wall thickness. Right ventricular systolic function is normal. Left Atrium: Left atrial size was normal in size. No left atrial/left atrial appendage thrombus was detected. Right Atrium: Right atrial size was normal in size. Pericardium: There is no evidence of pericardial effusion. Mitral Valve: The mitral valve is normal in structure. Mild mitral valve regurgitation. There is no evidence of mitral valve vegetation. Tricuspid Valve: The tricuspid valve is normal in structure. Tricuspid valve regurgitation is trivial. There is no evidence of tricuspid valve vegetation. Aortic Valve: The aortic valve is normal in structure. Aortic  valve regurgitation is trivial. There is no evidence of aortic valve vegetation. Pulmonic Valve: The pulmonic valve was normal in structure. Pulmonic valve regurgitation is trivial. Aorta: The aortic root and ascending aorta are structurally normal, with no evidence of dilitation. There is moderate (Grade III) protruding plaque involving the transverse aorta. IAS/Shunts: No atrial level shunt detected by color flow Doppler. Agitated saline contrast was given intravenously to evaluate for intracardiac shunting. Agitated saline contrast bubble study was negative, with no evidence of any interatrial shunt. There  is no evidence of a patent foramen ovale. There is no evidence of an atrial septal defect. 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

## 2020-06-30 NOTE — Treatment Plan (Signed)
Diagnosis: Pasteurella bacteremia Baseline Creatinine -0.82    Allergies  Allergen Reactions   Adhesive [Tape] Rash    With extended use    OPAT Orders Discharge antibiotics: Ceftriaxone 2 grams IV every 24 hours  End Date: 07/06/20  [facility] Per Protocol:use biopatch  Labs weekly while on IV antibiotics: _X_ CBC with differential  __X CMP   _X_ Please pull PIC at completion of IV antibiotics PIC in place until doctor has seen patient or been notified  Fax weekly labs to [phone]   Call [phone] with any questions

## 2020-07-01 LAB — GLUCOSE, CAPILLARY: Glucose-Capillary: 284 mg/dL — ABNORMAL HIGH (ref 70–99)

## 2020-07-01 MED ORDER — APIXABAN 5 MG PO TABS
5.0000 mg | ORAL_TABLET | Freq: Two times a day (BID) | ORAL | 0 refills | Status: DC
Start: 2020-07-04 — End: 2020-07-01

## 2020-07-01 MED ORDER — APIXABAN 5 MG PO TABS: 5.0000 mg | ORAL_TABLET | Freq: Two times a day (BID) | ORAL | 0 refills | Status: DC

## 2020-07-01 MED ORDER — CEFTRIAXONE IV (FOR PTA / DISCHARGE USE ONLY): 2.0000 g | INTRAVENOUS | 0 refills | Status: AC

## 2020-07-01 MED ORDER — APIXABAN 5 MG PO TABS: 10.0000 mg | ORAL_TABLET | Freq: Two times a day (BID) | ORAL | 0 refills | Status: DC

## 2020-07-01 MED ORDER — METRONIDAZOLE 500 MG PO TABS: 500.0000 mg | ORAL_TABLET | Freq: Two times a day (BID) | ORAL | 0 refills | Status: DC

## 2020-07-01 MED ORDER — METRONIDAZOLE 500 MG PO TABS: 500.0000 mg | ORAL_TABLET | Freq: Two times a day (BID) | ORAL | 0 refills | Status: AC

## 2020-07-01 NOTE — Discharge Summary (Signed)
Physician Discharge Summary  Madison Cuevas MLJ:[id number] DOB: 1945-10-10 DOA: 06/26/2020  PCP: Derinda Late, MD  Admit date: 06/26/2020 Discharge date: 07/01/2020  Discharge disposition: Home with home health therapy   Recommendations for Outpatient Follow-Up:   Follow-up with PCP in 7 to 10 days   Discharge Diagnosis:   Principal Problem:   Sepsis due to pneumonia [facility]) Active Problems:   Pasteurella infection   Bacteremia due to Gram-negative bacteria    Discharge Condition: Stable.  Diet recommendation:  Diet Order             Diet - low sodium heart healthy           Diet Carb Modified           Diet Carb Modified Fluid consistency: Thin; Room service appropriate? Yes  Diet effective now                     Code Status: Full Code     Hospital Course:   Ms. Madison Cuevas is a 75 y.o. female with medical history significant for type II DM, hypertension, CAD, dyslipidemia, recent EGD about a week prior to admission which showed acute gastritis.  She presented to the hospital with productive cough, wheezing and fever.   She was admitted to the hospital for sepsis secondary to pneumonia.  She was treated with empiric IV antibiotics and IV fluids.  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  ID was consulted to assist with management.  CTA also showed acute pulmonary embolism and venous duplex of the lower extremities showed left popliteal DVT.  There was no evidence of vegetations on 2D transthoracic echo so cardiologist was consulted for TEE.  There was no evidence of vegetations on TEE as well.  TEE showed protruding plaque in the transverse aorta.  ID recommended IV ceftriaxone and oral Flagyl at discharge.  Her condition has improved and she is deemed stable for discharge to home today.  All her questions were answered      Medical Consultants:   Cardiologist Infectious disease specialist   Discharge Exam:    Vitals:   06/30/20  1210 06/30/20 1731 06/30/20 1946 07/01/20 0408  BP: 138/76 135/74 136/89 127/68  Pulse: 86 88 91 93  Resp: '19 18 19 18  ' Temp: 98.1 F (36.7 C) 97.9 F (36.6 C) 98.3 F (36.8 C) 98.6 F (37 C)  TempSrc: Oral Oral Oral Oral  SpO2: 95% 94% 97% 93%  Weight:    88.4 kg  Height:         GEN: NAD SKIN: Warm and dry EYES: EOMI ENT: MMM CV: RRR PULM: CTA B ABD: soft, ND, NT, +BS CNS: AAO x 3, non focal EXT: No edema or tenderness   The results of significant diagnostics from this hospitalization (including imaging, microbiology, ancillary and laboratory) are listed below for reference.     Procedures and Diagnostic Studies:   US Venous Img Lower Bilateral (DVT)  Result Date: 06/29/2020 CLINICAL DATA:  Lower extremity pain. Recent diagnosis of pulmonary embolism. Evaluate for DVT. EXAM: BILATERAL LOWER EXTREMITY VENOUS DOPPLER ULTRASOUND TECHNIQUE: Gray-scale sonography with graded compression, as well as color Doppler and duplex ultrasound were performed to evaluate the lower extremity deep venous systems from the level of the common femoral vein and including the common femoral, femoral, profunda femoral, popliteal and calf veins including the posterior tibial, peroneal and gastrocnemius veins when visible. The superficial great saphenous vein was also interrogated. Spectral  Doppler was utilized to evaluate flow at rest and with distal augmentation maneuvers in the common femoral, femoral and popliteal veins. COMPARISON:  None. FINDINGS: RIGHT LOWER EXTREMITY Common Femoral Vein: No evidence of thrombus. Normal compressibility, respiratory phasicity and response to augmentation. Saphenofemoral Junction: No evidence of thrombus. Normal compressibility and flow on color Doppler imaging. Profunda Femoral Vein: No evidence of thrombus. Normal compressibility and flow on color Doppler imaging. Femoral Vein: No evidence of thrombus. Normal compressibility, respiratory phasicity and response to  augmentation. Popliteal Vein: No evidence of thrombus. Normal compressibility, respiratory phasicity and response to augmentation. Calf Veins: No evidence of thrombus. Normal compressibility and flow on color Doppler imaging. Superficial Great Saphenous Vein: No evidence of thrombus. Normal compressibility. Other Findings:  None. LEFT LOWER EXTREMITY Common Femoral Vein: No evidence of thrombus. Normal compressibility, respiratory phasicity and response to augmentation. Saphenofemoral Junction: No evidence of thrombus. Normal compressibility and flow on color Doppler imaging. Profunda Femoral Vein: No evidence of thrombus. Normal compressibility and flow on color Doppler imaging. Femoral Vein: No evidence of thrombus. Normal compressibility, respiratory phasicity and response to augmentation. Popliteal Vein: While the proximal aspect of the left popliteal vein appears widely patent, there is occlusive hypoechoic thrombus within one of the duplicated segments of the distal aspect of the left popliteal vein (images 53 and 54). Calf Veins: No evidence of thrombus. Normal compressibility and flow on color Doppler imaging. Superficial Great Saphenous Vein: No evidence of thrombus. Normal compressibility. Other Findings:  None. IMPRESSION: 1. Examination is positive for short segment occlusive DVT involving one of the duplicated segments of the distal aspect of the left popliteal vein. 2. No evidence of DVT within the right lower extremity. Electronically Signed   By: Sandi Mariscal M.D.   On: 06/29/2020 07:20   ECHO TEE  Result Date: 06/29/2020    TRANSESOPHOGEAL ECHO REPORT   Patient Name:   Madison Cuevas Date of Exam: 06/29/2020 Medical Rec #:  [id number]      Height:       67.0 in Accession #:    [id number]     Weight:       198.4 lb Date of Birth:  [birth year]      BSA:          2.015 m Patient Age:    48 years       BP:           120/78 mmHg Patient Gender: F              HR:           88 bpm. Exam Location:  [facility]  Procedure: Transesophageal Echo Indications:    endocarditis  History:        Patient has prior history of Echocardiogram examinations.                 Endocarditis.  Sonographer:    JERRY Referring Phys: [id number] [place]: The transesophogeal probe was passed without difficulty through the esophogus of the patient. Sedation performed by performing physician. The patient developed no complications during the procedure. IMPRESSIONS  1. Left ventricular ejection fraction, by estimation, is 60 to 65%. The left ventricle has normal function. The left ventricle has no regional wall motion abnormalities.  2. Right ventricular systolic function is normal. The right ventricular size is normal.  3. No left atrial/left atrial appendage thrombus was detected.  4. The mitral valve is normal in structure. Mild mitral valve regurgitation.  5. The  aortic valve is normal in structure. Aortic valve regurgitation is trivial.  6. 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  7. Agitated saline contrast bubble study was negative, with no evidence of any interatrial shunt. FINDINGS  Left Ventricle: Left ventricular ejection fraction, by estimation, is 60 to 65%. The left ventricle has normal function. The left ventricle has no regional wall motion abnormalities. The left ventricular internal cavity size was normal in size. Right Ventricle: The right ventricular size is normal. No increase in right ventricular wall thickness. Right ventricular systolic function is normal. Left Atrium: Left atrial size was normal in size. No left atrial/left atrial appendage thrombus was detected. Right Atrium: Right atrial size was normal in size. Pericardium: There is no evidence of pericardial effusion. Mitral Valve: The mitral valve is normal in structure. Mild mitral valve regurgitation. There is no evidence of mitral valve vegetation. Tricuspid Valve: The tricuspid valve is normal in structure.  Tricuspid valve regurgitation is trivial. There is no evidence of tricuspid valve vegetation. Aortic Valve: The aortic valve is normal in structure. Aortic valve regurgitation is trivial. There is no evidence of aortic valve vegetation. Pulmonic Valve: The pulmonic valve was normal in structure. Pulmonic valve regurgitation is trivial. Aorta: The aortic root and ascending aorta are structurally normal, with no evidence of dilitation. There is moderate (Grade III) protruding plaque involving the transverse aorta. IAS/Shunts: No atrial level shunt detected by color flow Doppler. Agitated saline contrast was given intravenously to evaluate for intracardiac shunting. Agitated saline contrast bubble study was negative, with no evidence of any interatrial shunt. There  is no evidence of a patent foramen ovale. There is no evidence of an atrial septal defect. Serafina Royals MD Electronically signed by Serafina Royals MD Signature Date/Time: 06/29/2020/8:39:18 AM    Final      Labs:   Basic Metabolic Panel: Recent Labs  Lab 06/26/20 2317 06/27/20 0504 06/28/20 0435 06/29/20 0221  NA 135 137 134* 140  K 4.1 4.1 3.2* 4.0  CL 103 103 102 106  CO2 '23 29 28 26  ' GLUCOSE 258* 224* 193* 195*  BUN '18 13 9 11  ' CREATININE 0.89 0.85 0.76 0.82  CALCIUM 9.1 8.5* 8.5* 9.3  MG  --   --  1.6* 1.8  PHOS  --   --  3.4  --    GFR Estimated Creatinine Clearance: 66.6 mL/min (by C-G formula based on SCr of 0.82 mg/dL). Liver Function Tests: Recent Labs  Lab 06/26/20 2317  AST 22  ALT 13  ALKPHOS 46  BILITOT 0.6  PROT 7.4  ALBUMIN 4.3   No results for input(s): LIPASE, AMYLASE in the last 168 hours. No results for input(s): AMMONIA in the last 168 hours. Coagulation profile Recent Labs  Lab 06/26/20 2317  INR 1.0    CBC: Recent Labs  Lab 06/26/20 2317 06/27/20 0504 06/28/20 0435  WBC 12.9* 12.1* 9.1  NEUTROABS 11.1*  --   --   HGB 14.2 12.8 12.3  HCT 41.3 37.6 36.0  MCV 88.1 87.9 88.7  PLT  191 171 167   Cardiac Enzymes: No results for input(s): CKTOTAL, CKMB, CKMBINDEX, TROPONINI in the last 168 hours. BNP: Invalid input(s): POCBNP CBG: Recent Labs  Lab 06/30/20 0854 06/30/20 1353 06/30/20 1724 06/30/20 2104 07/01/20 0909  GLUCAP 201* 217* 209* 157* 284*   D-Dimer No results for input(s): DDIMER in the last 72 hours. Hgb A1c No results for input(s): HGBA1C in the last 72 hours. Lipid  Profile No results for input(s): CHOL, HDL, LDLCALC, TRIG, CHOLHDL, LDLDIRECT in the last 72 hours. Thyroid function studies No results for input(s): TSH, T4TOTAL, T3FREE, THYROIDAB in the last 72 hours.  Invalid input(s): FREET3 Anemia work up No results for input(s): VITAMINB12, FOLATE, FERRITIN, TIBC, IRON, RETICCTPCT in the last 72 hours. Microbiology Recent Results (from the past 240 hour(s))  Culture, blood (Routine x 2)     Status: Abnormal   Collection Time: 06/26/20 11:17 PM   Specimen: BLOOD  Result Value Ref Range Status   Specimen Description   Final    BLOOD RIGHT FOREARM Performed at [facility], [address] [place], [place] [address]    Special Requests   Final    BOTTLES DRAWN AEROBIC AND ANAEROBIC Blood Culture results may not be optimal due to an excessive volume of blood received in culture bottles Performed at [facility], [address] [place], [place] [address]    Culture  Setup Time   Final    GRAM NEGATIVE RODS IN BOTH AEROBIC AND ANAEROBIC BOTTLES CRITICAL VALUE NOTED.  VALUE IS CONSISTENT WITH PREVIOUSLY REPORTED AND CALLED VALUE. Performed at [facility], [address] [place], [place] [address]    Culture (A)  Final    PASTEURELLA MULTOCIDA Usually susceptible to penicillin and other beta lactam agents,quinolones,macrolides and tetracyclines. Performed at [facility], [place] [address] [place], [place] [address]    Report Status 06/29/2020 FINAL  Final  Resp Panel by RT-PCR (Flu A&B, Covid)  Nasopharyngeal Swab     Status: None   Collection Time: 06/26/20 11:18 PM   Specimen: Nasopharyngeal Swab; Nasopharyngeal(NP) swabs in vial transport medium  Result Value Ref Range Status   SARS Coronavirus 2 by RT PCR NEGATIVE NEGATIVE Final    Comment: (NOTE) SARS-CoV-2 target nucleic acids are NOT DETECTED.  The SARS-CoV-2 RNA is generally detectable in upper respiratory specimens during the acute phase of infection. The lowest concentration of SARS-CoV-2 viral copies this assay can detect is 138 copies/mL. A negative result does not preclude SARS-Cov-2 infection and should not be used as the sole basis for treatment or other patient management decisions. A negative result may occur with  improper specimen collection/handling, submission of specimen other than nasopharyngeal swab, presence of viral mutation(s) within the areas targeted by this assay, and inadequate number of viral copies(<138 copies/mL). A negative result must be combined with clinical observations, patient history, and epidemiological information. The expected result is Negative.  Fact Sheet for Patients:  EntrepreneurPulse.com.au  Fact Sheet for Healthcare Providers:  IncredibleEmployment.be  This test is no t yet approved or cleared by the [place] FDA and  has been authorized for detection and/or diagnosis of SARS-CoV-2 by FDA under an Emergency Use Authorization (EUA). This EUA will remain  in effect (meaning this test can be used) for the duration of the COVID-19 declaration under Section 564(b)(1) of the Act, 21 U.S.C.section 360bbb-3(b)(1), unless the authorization is terminated  or revoked sooner.       Influenza A by PCR NEGATIVE NEGATIVE Final   Influenza B by PCR NEGATIVE NEGATIVE Final    Comment: (NOTE) The Xpert Xpress SARS-CoV-2/FLU/RSV plus assay is intended as an aid in the diagnosis of influenza from Nasopharyngeal swab specimens and should not be  used as a sole basis for treatment. Nasal washings and aspirates are unacceptable for Xpert Xpress SARS-CoV-2/FLU/RSV testing.  Fact Sheet for Patients: EntrepreneurPulse.com.au  Fact Sheet for Healthcare Providers: IncredibleEmployment.be  This test  is not yet approved or cleared by the [place] and has been authorized for detection and/or diagnosis of SARS-CoV-2 by FDA under an Emergency Use Authorization (EUA). This EUA will remain in effect (meaning this test can be used) for the duration of the COVID-19 declaration under Section 564(b)(1) of the Act, 21 U.S.C. section 360bbb-3(b)(1), unless the authorization is terminated or revoked.  Performed at [facility], [address] [place], [place] [address]   Culture, blood (Routine x 2)     Status: Abnormal   Collection Time: 06/27/20 12:25 AM   Specimen: BLOOD  Result Value Ref Range Status   Specimen Description   Final    BLOOD LEFT ANTECUBITAL Performed at [facility], [place] [address], [place], [place] [address]    Special Requests   Final    BOTTLES DRAWN AEROBIC AND ANAEROBIC Blood Culture adequate volume Performed at [facility], [address] [place], [place] [address]    Culture  Setup Time   Final    GRAM NEGATIVE RODS IN BOTH AEROBIC AND ANAEROBIC BOTTLES CRITICAL RESULT CALLED TO, READ BACK BY AND VERIFIED WITH: DEVIN MITCHELL, PHARMD AT 5093 ON 06/27/20 BY GM    Culture (A)  Final    PASTEURELLA MULTOCIDA Usually susceptible to penicillin and other beta lactam agents,quinolones,macrolides and tetracyclines. Performed at [facility], [place] [address] [place], [place] [address]    Report Status 06/29/2020 FINAL  Final  Blood Culture ID Panel (Reflexed)     Status: None   Collection Time: 06/27/20 12:25 AM  Result Value Ref Range Status   Enterococcus faecalis NOT DETECTED NOT DETECTED Final   Enterococcus Faecium NOT DETECTED NOT  DETECTED Final   Listeria monocytogenes NOT DETECTED NOT DETECTED Final   Staphylococcus species NOT DETECTED NOT DETECTED Final   Staphylococcus aureus (BCID) NOT DETECTED NOT DETECTED Final   Staphylococcus epidermidis NOT DETECTED NOT DETECTED Final   Staphylococcus lugdunensis NOT DETECTED NOT DETECTED Final   Streptococcus species NOT DETECTED NOT DETECTED Final   Streptococcus agalactiae NOT DETECTED NOT DETECTED Final   Streptococcus pneumoniae NOT DETECTED NOT DETECTED Final   Streptococcus pyogenes NOT DETECTED NOT DETECTED Final   A.calcoaceticus-baumannii NOT DETECTED NOT DETECTED Final   Bacteroides fragilis NOT DETECTED NOT DETECTED Final   Enterobacterales NOT DETECTED NOT DETECTED Final   Enterobacter cloacae complex NOT DETECTED NOT DETECTED Final   Escherichia coli NOT DETECTED NOT DETECTED Final   Klebsiella aerogenes NOT DETECTED NOT DETECTED Final   Klebsiella oxytoca NOT DETECTED NOT DETECTED Final   Klebsiella pneumoniae NOT DETECTED NOT DETECTED Final   Proteus species NOT DETECTED NOT DETECTED Final   Salmonella species NOT DETECTED NOT DETECTED Final   Serratia marcescens NOT DETECTED NOT DETECTED Final   Haemophilus influenzae NOT DETECTED NOT DETECTED Final   Neisseria meningitidis NOT DETECTED NOT DETECTED Final   Pseudomonas aeruginosa NOT DETECTED NOT DETECTED Final   Stenotrophomonas maltophilia NOT DETECTED NOT DETECTED Final   Candida albicans NOT DETECTED NOT DETECTED Final   Candida auris NOT DETECTED NOT DETECTED Final   Candida glabrata NOT DETECTED NOT DETECTED Final   Candida krusei NOT DETECTED NOT DETECTED Final   Candida parapsilosis NOT DETECTED NOT DETECTED Final   Candida tropicalis NOT DETECTED NOT DETECTED Final   Cryptococcus neoformans/gattii NOT DETECTED NOT DETECTED Final    Comment: Performed at [facility], [address], [place], [place] [address]  Urine Culture     Status: None  Collection Time: 06/27/20  1:41 AM    Specimen: Urine, Catheterized  Result Value Ref Range Status   Specimen Description   Final    URINE, CATHETERIZED Performed at [facility], [address] [place], [place] [address]    Special Requests   Final    NONE Performed at [facility], [address] [place], [place] [address]    Culture   Final    NO GROWTH Performed at [facility], [place] [address] [place], [place] [address]    Report Status 06/28/2020 FINAL  Final  Respiratory (~20 pathogens) panel by PCR     Status: None   Collection Time: 06/28/20  2:24 PM   Specimen: Nasopharyngeal Swab; Respiratory  Result Value Ref Range Status   Adenovirus NOT DETECTED NOT DETECTED Final   Coronavirus 229E NOT DETECTED NOT DETECTED Final    Comment: (NOTE) The Coronavirus on the Respiratory Panel, DOES NOT test for the novel  Coronavirus (2019 nCoV)    Coronavirus HKU1 NOT DETECTED NOT DETECTED Final   Coronavirus NL63 NOT DETECTED NOT DETECTED Final   Coronavirus OC43 NOT DETECTED NOT DETECTED Final   Metapneumovirus NOT DETECTED NOT DETECTED Final   Rhinovirus / Enterovirus NOT DETECTED NOT DETECTED Final   Influenza A NOT DETECTED NOT DETECTED Final   Influenza B NOT DETECTED NOT DETECTED Final   Parainfluenza Virus 1 NOT DETECTED NOT DETECTED Final   Parainfluenza Virus 2 NOT DETECTED NOT DETECTED Final   Parainfluenza Virus 3 NOT DETECTED NOT DETECTED Final   Parainfluenza Virus 4 NOT DETECTED NOT DETECTED Final   Respiratory Syncytial Virus NOT DETECTED NOT DETECTED Final   Bordetella pertussis NOT DETECTED NOT DETECTED Final   Bordetella Parapertussis NOT DETECTED NOT DETECTED Final   Chlamydophila pneumoniae NOT DETECTED NOT DETECTED Final   Mycoplasma pneumoniae NOT DETECTED NOT DETECTED Final    Comment: Performed at [facility], [place], [place], [place] [address]  CULTURE, BLOOD (ROUTINE X 2) w Reflex to ID Panel     Status: None (Preliminary result)   Collection  Time: 06/29/20  2:20 AM   Specimen: BLOOD  Result Value Ref Range Status   Specimen Description BLOOD LEFT ANTECUBITAL  Final   Special Requests   Final    BOTTLES DRAWN AEROBIC AND ANAEROBIC Blood Culture adequate volume   Culture   Final    NO GROWTH 2 DAYS Performed at [facility], [address] [place], [place] [address]    Report Status PENDING  Incomplete  CULTURE, BLOOD (ROUTINE X 2) w Reflex to ID Panel     Status: None (Preliminary result)   Collection Time: 06/29/20  2:21 AM   Specimen: BLOOD  Result Value Ref Range Status   Specimen Description BLOOD RIGHT ANTECUBITAL  Final   Special Requests   Final    BOTTLES DRAWN AEROBIC AND ANAEROBIC Blood Culture adequate volume   Culture   Final    NO GROWTH 2 DAYS Performed at [facility], [address], [place], [place] [address]    Report Status PENDING  Incomplete     Discharge Instructions:   Discharge Instructions     [facility] pharmacist to adjust dose for Vancomycin, Aminoglycosides and other anti-infective therapies as requested by physician.   Complete by: As directed    [facility] to provide Cath Flo 73m   Complete by: As directed    Administer for PICC line occlusion and as  ordered by physician for other access device issues.   Anaphylaxis Kit: Provided to treat any anaphylactic reaction to the medication being provided to the patient if First Dose or when requested by physician   Complete by: As directed    Epinephrine 59m/ml vial / amp: Administer 0.334m(0.15m415msubcutaneously once for moderate to severe anaphylaxis, nurse to call physician and pharmacy when reaction occurs and call 911 if needed for immediate care   Diphenhydramine 59m29m IV vial: Administer 25-59mg74mIM PRN for first dose reaction, rash, itching, mild reaction, nurse to call physician and pharmacy when reaction occurs   Sodium Chloride 0.9% NS 500ml 27mAdminister if needed for hypovolemic  blood pressure drop or as ordered by physician after call to physician with anaphylactic reaction   Change dressing on IV access line weekly and PRN   Complete by: As directed    Diet - low sodium heart healthy   Complete by: As directed    Diet Carb Modified   Complete by: As directed    Flush IV access with Sodium Chloride 0.9% and Heparin 10 units/ml or 100 units/ml   Complete by: As directed    Home infusion instructions - [facility]   Complete by: As directed    Instructions: Flush IV access with Sodium Chloride 0.9% and Heparin 10units/ml or 100units/ml   Change dressing on IV access line: Weekly and PRN   Instructions Cath Flo 2mg: A80mnister for PICC Line occlusion and as ordered by physician for other access device   [facility] pharmacist to adjust dose for: Vancomycin, Aminoglycosides and other anti-infective therapies as requested by physician   Increase activity slowly   Complete by: As directed    Method of administration may be changed at the discretion of home infusion pharmacist based upon assessment of the patient and/or caregiver's ability to self-administer the medication ordered   Complete by: As directed       Allergies as of 07/01/2020       Reactions   Adhesive [tape] Rash   With extended use        Medication List     STOP taking these medications    azelastine 0.05 % ophthalmic solution Commonly known as: OPTIVAR   CEPHALEXIN PO       TAKE these medications    apixaban 5 MG Tabs tablet Commonly known as: ELIQUIS Take 2 tablets (10 mg total) by mouth 2 (two) times daily for 3 days.   apixaban 5 MG Tabs tablet Commonly known as: ELIQUIS Take 1 tablet (5 mg total) by mouth 2 (two) times daily. Start taking on: July 04, 2020   CALTRATE 600 PLUS-VIT D PO Take 1 tablet by mouth 2 (two) times daily.   cefTRIAXone  IVPB Commonly known as: ROCEPHIN Inject 2 g into the vein daily for 4 days. Indication:  Pasteurella  multocida bacteremia First Dose: Yes Last Day of Therapy:  07/06/2020 Labs - Once weekly:  CBC/D and CMP Do no removed midline until seen by ID or ID gives approval to remove Fax weekly labs to [phone] of administration: IV Push Method of administration may be changed at the discretion of home infusion pharmacist based upon assessment of the patient and/or caregiver's ability to self-administer the medication ordered. Start taking on: July 02, 2020   glipiZIDE 5 MG 24 hr tablet Commonly known as: GLUCOTROL XL Take 5 mg by mouth daily with breakfast.   losartan 50 MG tablet Commonly known as: COZAAR  Take 50 mg by mouth daily.   metFORMIN 500 MG tablet Commonly known as: GLUCOPHAGE Take by mouth 2 (two) times daily with a meal.   metroNIDAZOLE 500 MG tablet Commonly known as: FLAGYL Take 1 tablet (500 mg total) by mouth 2 (two) times daily for 5 days.   montelukast 10 MG tablet Commonly known as: SINGULAIR Take 10 mg by mouth daily as needed.   MULTIPLE VITAMIN PO Take 1 tablet by mouth daily.   MUPIROCIN EX Apply topically.   ondansetron 4 MG disintegrating tablet Commonly known as: Zofran ODT Take 1 tablet (4 mg total) by mouth every 8 (eight) hours as needed for nausea or vomiting.   pantoprazole 40 MG tablet Commonly known as: Protonix Take 1 tablet (40 mg total) by mouth daily.   progesterone 100 MG capsule Commonly known as: PROMETRIUM Take 100 mg by mouth daily.   psyllium 58.6 % packet Commonly known as: METAMUCIL Take 1 packet by mouth daily.   simvastatin 20 MG tablet Commonly known as: ZOCOR Take 20 mg by mouth daily.   sitaGLIPtin 100 MG tablet Commonly known as: JANUVIA Take 100 mg by mouth daily.   tolterodine 4 MG 24 hr capsule Commonly known as: DETROL LA Take 2 mg by mouth 2 (two) times daily.   venlafaxine 75 MG tablet Commonly known as: EFFEXOR Take 75 mg by mouth daily.       ASK your doctor about these medications     albuterol 108 (90 Base) MCG/ACT inhaler Commonly known as: VENTOLIN HFA Inhale 1-2 puffs into the lungs every 6 (six) hours as needed for wheezing or shortness of breath.               Discharge Care Instructions  (From admission, onward)           Start     Ordered   07/01/20 0000  Change dressing on IV access line weekly and PRN  (Home infusion instructions - [facility] )        07/01/20 0953            Follow-up Information     Corey Skains, MD Follow up in 1 week(s).   Specialty: Cardiology Contact information: [address] [facility] [place] [address] [phone]                  Time coordinating discharge: 32 minutes  Signed:  Jennye Boroughs  [facility] 07/01/2020, 9:53 AM   Pager on [email or web address]. If 7PM-7AM, please contact night-coverage at [email or web address]

## 2020-07-01 NOTE — Progress Notes (Signed)
This RN provided discharge instructions and teaching to both the patient and the patient's husband. Both the patient and her husband stated an understanding of the provided instructions. All outstanding questions resolved. Patient to discharge with midline in place for IV Antibiotic therapy in the home. All belongings packed and in tow. Volunteer services to transport patient to private vehicle via wheelchair at time of departure.

## 2020-07-01 NOTE — Plan of Care (Signed)
  Problem: Education: Goal: Knowledge of General Education information will improve Description: Including pain rating scale, medication(s)/side effects and non-pharmacologic comfort measures 07/01/2020 1050 by Ansel Bong, RN Outcome: Adequate for Discharge 07/01/2020 1050 by Ansel Bong, RN Outcome: Adequate for Discharge   Problem: Health Behavior/Discharge Planning: Goal: Ability to manage health-related needs will improve 07/01/2020 1050 by Ansel Bong, RN Outcome: Adequate for Discharge 07/01/2020 1050 by Ansel Bong, RN Outcome: Adequate for Discharge   Problem: Clinical Measurements: Goal: Ability to maintain clinical measurements within normal limits will improve 07/01/2020 1050 by Ansel Bong, RN Outcome: Adequate for Discharge 07/01/2020 1050 by Ansel Bong, RN Outcome: Adequate for Discharge Goal: Will remain free from infection 07/01/2020 1050 by Ansel Bong, RN Outcome: Adequate for Discharge 07/01/2020 1050 by Ansel Bong, RN Outcome: Adequate for Discharge Goal: Diagnostic test results will improve 07/01/2020 1050 by Ansel Bong, RN Outcome: Adequate for Discharge 07/01/2020 1050 by Ansel Bong, RN Outcome: Adequate for Discharge Goal: Respiratory complications will improve 07/01/2020 1050 by Ansel Bong, RN Outcome: Adequate for Discharge 07/01/2020 1050 by Ansel Bong, RN Outcome: Adequate for Discharge Goal: Cardiovascular complication will be avoided 07/01/2020 1050 by Ansel Bong, RN Outcome: Adequate for Discharge 07/01/2020 1050 by Ansel Bong, RN Outcome: Adequate for Discharge   Problem: Activity: Goal: Risk for activity intolerance will decrease 07/01/2020 1050 by Ansel Bong, RN Outcome: Adequate for Discharge 07/01/2020 1050 by Ansel Bong, RN Outcome: Adequate for Discharge   Problem: Nutrition: Goal: Adequate nutrition will be maintained 07/01/2020 1050 by Ansel Bong, RN Outcome: Adequate for  Discharge 07/01/2020 1050 by Ansel Bong, RN Outcome: Adequate for Discharge   Problem: Coping: Goal: Level of anxiety will decrease 07/01/2020 1050 by Ansel Bong, RN Outcome: Adequate for Discharge 07/01/2020 1050 by Ansel Bong, RN Outcome: Adequate for Discharge   Problem: Elimination: Goal: Will not experience complications related to bowel motility 07/01/2020 1050 by Ansel Bong, RN Outcome: Adequate for Discharge 07/01/2020 1050 by Ansel Bong, RN Outcome: Adequate for Discharge Goal: Will not experience complications related to urinary retention 07/01/2020 1050 by Ansel Bong, RN Outcome: Adequate for Discharge 07/01/2020 1050 by Ansel Bong, RN Outcome: Adequate for Discharge   Problem: Pain Managment: Goal: General experience of comfort will improve 07/01/2020 1050 by Ansel Bong, RN Outcome: Adequate for Discharge 07/01/2020 1050 by Ansel Bong, RN Outcome: Adequate for Discharge   Problem: Safety: Goal: Ability to remain free from injury will improve 07/01/2020 1050 by Ansel Bong, RN Outcome: Adequate for Discharge 07/01/2020 1050 by Ansel Bong, RN Outcome: Adequate for Discharge   Problem: Skin Integrity: Goal: Risk for impaired skin integrity will decrease 07/01/2020 1050 by Ansel Bong, RN Outcome: Adequate for Discharge 07/01/2020 1050 by Ansel Bong, RN Outcome: Adequate for Discharge

## 2020-07-03 ENCOUNTER — Telehealth: Payer: Self-pay

## 2020-07-03 NOTE — Telephone Encounter (Signed)
Patient D/C from hosp on Saturday and called to report D/C papers states she needs to take 2 Eliquis daily but bottle says 1 daily. I advised her to call cardiology and they will instruct on what dose to take. Patient expressed understanding and will call Dr Gwen Pounds.

## 2020-07-04 ENCOUNTER — Other Ambulatory Visit: Payer: Self-pay

## 2020-07-04 ENCOUNTER — Emergency Department
Admission: EM | Admit: 2020-07-04 | Discharge: 2020-07-04 | Disposition: A | Discharge status: Home / Self Care | Payer: Medicare PPO | Attending: Emergency Medicine | Admitting: Emergency Medicine

## 2020-07-04 DIAGNOSIS — Z7901 Long term (current) use of anticoagulants: Secondary | ICD-10-CM | POA: Insufficient documentation

## 2020-07-04 DIAGNOSIS — Z7984 Long term (current) use of oral hypoglycemic drugs: Secondary | ICD-10-CM | POA: Diagnosis not present

## 2020-07-04 DIAGNOSIS — I2510 Atherosclerotic heart disease of native coronary artery without angina pectoris: Secondary | ICD-10-CM | POA: Diagnosis not present

## 2020-07-04 DIAGNOSIS — Z96651 Presence of right artificial knee joint: Secondary | ICD-10-CM | POA: Insufficient documentation

## 2020-07-04 DIAGNOSIS — Z87891 Personal history of nicotine dependence: Secondary | ICD-10-CM | POA: Insufficient documentation

## 2020-07-04 DIAGNOSIS — Z95828 Presence of other vascular implants and grafts: Secondary | ICD-10-CM

## 2020-07-04 DIAGNOSIS — E1165 Type 2 diabetes mellitus with hyperglycemia: Secondary | ICD-10-CM | POA: Diagnosis not present

## 2020-07-04 DIAGNOSIS — Z452 Encounter for adjustment and management of vascular access device: Secondary | ICD-10-CM | POA: Insufficient documentation

## 2020-07-04 DIAGNOSIS — Z79899 Other long term (current) drug therapy: Secondary | ICD-10-CM | POA: Diagnosis not present

## 2020-07-04 DIAGNOSIS — R739 Hyperglycemia, unspecified: Secondary | ICD-10-CM

## 2020-07-04 DIAGNOSIS — I10 Essential (primary) hypertension: Secondary | ICD-10-CM | POA: Diagnosis not present

## 2020-07-04 LAB — COMPREHENSIVE METABOLIC PANEL
ALT: 11 U/L (ref 0–44)
AST: 18 U/L (ref 15–41)
Albumin: 3.6 g/dL (ref 3.5–5.0)
Alkaline Phosphatase: 38 U/L (ref 38–126)
Anion gap: 7 (ref 5–15)
BUN: 18 mg/dL (ref 8–23)
CO2: 28 mmol/L (ref 22–32)
Calcium: 9.3 mg/dL (ref 8.9–10.3)
Chloride: 104 mmol/L (ref 98–111)
Creatinine, Ser: 0.8 mg/dL (ref 0.44–1.00)
GFR, Estimated: >60 mL/min (ref >60)
Glucose, Bld: 223 mg/dL — ABNORMAL HIGH (ref 70–99)
Potassium: 3.9 mmol/L (ref 3.5–5.1)
Sodium: 139 mmol/L (ref 135–145)
Total Bilirubin: 0.4 mg/dL (ref 0.3–1.2)
Total Protein: 6.9 g/dL (ref 6.5–8.1)

## 2020-07-04 LAB — CULTURE, BLOOD (ROUTINE X 2)
Culture: NO GROWTH
Culture: NO GROWTH
Special Requests: ADEQUATE
Special Requests: ADEQUATE

## 2020-07-04 LAB — CBC WITH DIFFERENTIAL/PLATELET
Abs Immature Granulocytes: 0.04 10*3/uL (ref 0.00–0.07)
Basophils Absolute: 0.1 10*3/uL (ref 0.0–0.1)
Basophils Relative: 1 %
Eosinophils Absolute: 0.8 10*3/uL — ABNORMAL HIGH (ref 0.0–0.5)
Eosinophils Relative: 9 %
HCT: 38.6 % (ref 36.0–46.0)
Hemoglobin: 13.1 g/dL (ref 12.0–15.0)
Immature Granulocytes: 1 %
Lymphocytes Relative: 26 %
Lymphs Abs: 2.1 10*3/uL (ref 0.7–4.0)
MCH: 30.5 pg (ref 26.0–34.0)
MCHC: 33.9 g/dL (ref 30.0–36.0)
MCV: 90 fL (ref 80.0–100.0)
Monocytes Absolute: 0.5 10*3/uL (ref 0.1–1.0)
Monocytes Relative: 7 %
Neutro Abs: 4.7 10*3/uL (ref 1.7–7.7)
Neutrophils Relative %: 56 %
Platelets: 279 10*3/uL (ref 150–400)
RBC: 4.29 MIL/uL (ref 3.87–5.11)
RDW: 12.2 % (ref 11.5–15.5)
WBC: 8.2 10*3/uL (ref 4.0–10.5)
nRBC: 0 % (ref 0.0–0.2)

## 2020-07-04 NOTE — ED Provider Notes (Signed)
[facility] Emergency Department Provider Note  ____________________________________________   Event Date/Time   First MD Initiated Contact with Patient 07/04/20 1641     (approximate)  I have reviewed the triage vital signs and the nursing notes.   HISTORY  Chief Complaint Vascular Access Problem   HPI Madison Cuevas is a 75 y.o. female with medical history significant for type II DM, hypertension, CAD, dyslipidemia, and recent admission 6/20-6/25 for treatment of sepsis secondary to pneumonia and new diagnosis of DVT discharged on twice daily Eliquis as well as with a left upper extremity PICC line through which she receives daily antibiotics there presents after referred to the ED after her PICC line nurse had trouble drawing labs from PICC line earlier today.  Patient states her husband administers her antibiotics and have not had any issues with this.  States she actually feels much better than when leaving the ED.  She states that when the nurse tried to draw back from the line earlier today made aware noise and when she flushed it she had some cool sensation in her arm which she never had before.  Her nurse wanted her to get checked out.  She states her breathing and cough have gotten much better since leaving the hospital.  She has no new chest pain, headache or earache, sore throat, nausea, vomiting, Derica dysuria, rash or any other new sick symptoms.  No other acute concerns at this time.         Past Medical History:  Diagnosis Date   Diabetes mellitus without complication (HCC)    GERD (gastroesophageal reflux disease)    Herpes zoster    History of chicken pox    Hyperlipidemia    Hypertension    pt denies.  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   Bacteremia due to Gram-negative bacteria  06/29/2020   Sepsis due to pneumonia (HCC) 06/27/2020    Past Surgical History:  Procedure Laterality Date   APPENDECTOMY     BACK SURGERY     CATARACT EXTRACTION W/PHACO Right 09/09/2018   Procedure: CATARACT EXTRACTION PHACO AND INTRAOCULAR LENS PLACEMENT (IOC)  RIGHT DIABETIC  01:01.5  16.2%  9.99;  Surgeon: Lockie Mola, MD;  Location: [facility];  Service: Ophthalmology;  Laterality: Right;  Diabetic - oral meds   COLONOSCOPY WITH PROPOFOL N/A 08/21/2015   Procedure: COLONOSCOPY WITH PROPOFOL;  Surgeon: Christena Deem, MD;  Location: [facility];  Service: Endoscopy;  Laterality: N/A;   ESOPHAGOGASTRODUODENOSCOPY (EGD) WITH PROPOFOL N/A 06/21/2020   Procedure: ESOPHAGOGASTRODUODENOSCOPY (EGD) WITH PROPOFOL;  Surgeon: Toledo, Boykin Nearing, MD;  Location: [facility] ENDOSCOPY;  Service: Gastroenterology;  Laterality: N/A;   JOINT REPLACEMENT Right    knee   TEE WITHOUT CARDIOVERSION N/A 06/29/2020   Procedure: TRANSESOPHAGEAL ECHOCARDIOGRAM (TEE);  Surgeon: Lamar Blinks, MD;  Location: [facility] ORS;  Service: Cardiovascular;  Laterality: N/A;   TONSILLECTOMY     TUBAL LIGATION      Prior to Admission medications   Medication Sig Start Date End Date Taking? Authorizing Provider  albuterol (VENTOLIN HFA) 108 (90 Base) MCG/ACT inhaler Inhale 1-2 puffs into the lungs every 6 (six) hours as needed for wheezing or shortness of breath. Patient not taking: No sig reported    [provider]  apixaban (ELIQUIS) 5  MG TABS tablet Take 2 tablets (10 mg total) by mouth 2 (two) times daily for 3 days. 07/01/20 07/04/20  Lurene Shadow, MD  apixaban (ELIQUIS) 5 MG TABS tablet Take 1 tablet (5 mg total) by mouth 2 (two) times daily. 07/04/20   Lurene Shadow, MD  Calcium-Vitamin D (CALTRATE 600 PLUS-VIT D PO) Take 1 tablet by mouth 2 (two) times daily.    [provider]  cefTRIAXone (ROCEPHIN) IVPB Inject 2 g into the vein daily for 4 days. Indication:  Pasteurella multocida  bacteremia First Dose: Yes Last Day of Therapy:  07/06/2020 Labs - Once weekly:  CBC/D and CMP Do no removed midline until seen by ID or ID gives approval to remove Fax weekly labs to [phone] Method of administration: IV Push Method of administration may be changed at the discretion of home infusion pharmacist based upon assessment of the patient and/or caregiver's ability to self-administer the medication ordered. 07/02/20 07/06/20  Lurene Shadow, MD  glipiZIDE (GLUCOTROL XL) 5 MG 24 hr tablet Take 5 mg by mouth daily with breakfast.    [provider]  losartan (COZAAR) 50 MG tablet Take 50 mg by mouth daily.    [provider]  metFORMIN (GLUCOPHAGE) 500 MG tablet Take by mouth 2 (two) times daily with a meal.    [provider]  metroNIDAZOLE (FLAGYL) 500 MG tablet Take 1 tablet (500 mg total) by mouth 2 (two) times daily for 5 days. 07/01/20 07/06/20  Lurene Shadow, MD  montelukast (SINGULAIR) 10 MG tablet Take 10 mg by mouth daily as needed.    [provider]  MULTIPLE VITAMIN PO Take 1 tablet by mouth daily.    [provider]  MUPIROCIN EX Apply topically.    [provider]  ondansetron (ZOFRAN ODT) 4 MG disintegrating tablet Take 1 tablet (4 mg total) by mouth every 8 (eight) hours as needed for nausea or vomiting. 05/23/20   Minna Antis, MD  pantoprazole (PROTONIX) 40 MG tablet Take 1 tablet (40 mg total) by mouth daily. 05/23/20 05/23/21  Minna Antis, MD  progesterone (PROMETRIUM) 100 MG capsule Take 100 mg by mouth daily.    [provider]  psyllium (METAMUCIL) 58.6 % packet Take 1 packet by mouth daily.    [provider]  simvastatin (ZOCOR) 20 MG tablet Take 20 mg by mouth daily.    [provider]  sitaGLIPtin (JANUVIA) 100 MG tablet Take 100 mg by mouth daily.    [provider]  tolterodine (DETROL LA) 4 MG 24 hr capsule Take 2 mg by mouth 2 (two) times daily.      [provider]  venlafaxine (EFFEXOR) 75 MG tablet Take 75 mg by mouth daily.    [provider]    Allergies Adhesive [tape]  Family History  Problem Relation Age of Onset   Breast cancer Cousin     Social History Social History   Tobacco Use   Smoking status: Former    Packs/day: 1.00    Years: 20.00    Pack years: 20.00    Types: Cigarettes    Quit date: 1999    Years since quitting: 23.5   Smokeless tobacco: Never  Vaping Use   Vaping Use: Never used  Substance Use Topics   Alcohol use: Yes    Comment: may have 1-2 drinks/month   Drug use: No    Review of Systems  Review of Systems  Constitutional:  Negative for chills and fever.  HENT:  Negative for sore throat.   Eyes:  Negative for pain.  Respiratory:  Positive for cough (mild). Negative for stridor.   Cardiovascular:  Negative for chest pain.  Gastrointestinal:  Negative for vomiting.  Genitourinary:  Negative for dysuria.  Musculoskeletal:  Negative for myalgias.  Skin:  Negative for rash.  Neurological:  Negative for seizures, loss of consciousness and headaches.  Psychiatric/Behavioral:  Negative for suicidal ideas.   All other systems reviewed and are negative.    ____________________________________________   PHYSICAL EXAM:  VITAL SIGNS: ED Triage Vitals  Enc Vitals Group     BP 07/04/20 1543 (!) 153/81     Pulse Rate 07/04/20 1543 96     Resp 07/04/20 1543 17     Temp 07/04/20 1543 98.4 F (36.9 C)     Temp src --      SpO2 07/04/20 1543 100 %     Weight --      Height --      Head Circumference --      Peak Flow --      Pain Score 07/04/20 1542 0     Pain Loc --      Pain Edu? --      Excl. in GC? --    Vitals:   07/04/20 1543 07/04/20 1856  BP: (!) 153/81   Pulse: 96 90  Resp: 17 16  Temp: 98.4 F (36.9 C)   SpO2: 100% 100%   Physical Exam Vitals and nursing note reviewed.  Constitutional:      General: She is not in acute distress.    Appearance:  She is well-developed.  HENT:     Head: Normocephalic and atraumatic.     Right Ear: External ear normal.     Left Ear: External ear normal.     Nose: Nose normal.  Eyes:     Conjunctiva/sclera: Conjunctivae normal.  Cardiovascular:     Rate and Rhythm: Normal rate and regular rhythm.     Heart sounds: No murmur heard. Pulmonary:     Effort: Pulmonary effort is normal. No respiratory distress.     Breath sounds: Normal breath sounds.  Abdominal:     Palpations: Abdomen is soft.     Tenderness: There is no abdominal tenderness.  Musculoskeletal:     Cervical back: Neck supple.  Skin:    General: Skin is warm and dry.     Capillary Refill: Capillary refill takes less than 2 seconds.  Neurological:     Mental Status: She is alert and oriented to person, place, and time.  Psychiatric:        Mood and Affect: Mood normal.    Left upper extremity PICC line in place with dressing in place without surrounding induration erythema warmth or other surrounding skin changes. ____________________________________________   LABS (all labs ordered are listed, but only abnormal results are displayed)  Labs Reviewed  CBC WITH DIFFERENTIAL/PLATELET - Abnormal; Notable for the following components:      Result Value   Eosinophils Absolute 0.8 (*)    All other components within normal limits  COMPREHENSIVE METABOLIC PANEL - Abnormal; Notable for the following components:   Glucose, Bld 223 (*)    All other components within normal limits   ____________________________________________  EKG  ____________________________________________  RADIOLOGY  ED MD interpretation:    Official radiology report(s): No results found.  ____________________________________________   PROCEDURES  Procedure(s) performed (including Critical Care):  Procedures   ____________________________________________   INITIAL IMPRESSION / ASSESSMENT  AND PLAN / ED COURSE     Patient presents with  above-stated history exam after being referred to the ED by her outpatient infusion nurse who saw her at her home today who was able to draw blood from her left upper extremity PICC line.  On arrival she is afebrile and hemodynamically stable.  On exam she has a PICC line in place without surrounding skin changes.  The PICC line does flush easily and patient has no pain with this and there is no palpable fluid accumulation around the PICC line.  He does not brought back blood but was able to check the labs that the nurse had been trying to draw earlier today which include a CBC and CMP.  These were drawn.  CBC showed no leukocytosis or acute anemia.  BMP shows some baseline hyperglycemia at 223 compared to 105 5 days ago without any other significant electrolyte or metabolic derangements or evidence of kidney injury.  Given line is flushing without any symptoms or palpable extravasation and very little resistance I think it is functional with purposes of infusion antibiotics at home and will defer emergent replacement at this time.  Advised patient if she experiences any persistently administering antibiotics at home or develops any pain or swelling in her arm she must immediately turn to the emergency room.  She is amenable with plan.  Overall Evalose patient for sepsis or other Mi life-threatening process.  Advised to continue taking all of her medications.  Discharged stable condition.  Strict return precautions advised and discussed.       ____________________________________________   FINAL CLINICAL IMPRESSION(S) / ED DIAGNOSES  Final diagnoses:  Status post peripherally inserted central catheter (PICC) central line placement  Hyperglycemia    Medications - No data to display   ED Discharge Orders     None        Note:  This document was prepared using Dragon voice recognition software and may include unintentional dictation errors.    Gilles Chiquito, MD 07/04/20 1901

## 2020-07-04 NOTE — ED Triage Notes (Signed)
Pt comes with c/o PICC line issues. Pt states her home health nurse came over today to collect blood. Pt states the nurse said something was wrong with her PICC and wasn't;t getting any blood return.  Pt states she flushed it but it was making a weird noise. Pt was then informed to come here.  This RN attempted to flush picc and it flushed but pt states it is hurting. This RN able to see some of the cath out of the skin. Bandage in place.

## 2020-07-04 NOTE — ED Notes (Signed)
ED Provider at bedside. 

## 2020-07-04 NOTE — ED Notes (Signed)
Patient presents to the ED with a PICC line to LUE. Patient reports PICC was used today, for at home abx. Patient reports the home health aid came by to check on the PICC line, and reported difficulty flushing the line, as well as no blood return. Patient reports she was sent to the ED to have her PICC line checked, and labs drawn. Patient denies pain.   PICC line assessed by this RN and Dr. Katrinka Blazing. PICC line flushes without resistance, but no blood return is noted. Dr. Katrinka Blazing aware.

## 2020-07-07 DIAGNOSIS — I82409 Acute embolism and thrombosis of unspecified deep veins of unspecified lower extremity: Secondary | ICD-10-CM

## 2020-07-07 DIAGNOSIS — I2699 Other pulmonary embolism without acute cor pulmonale: Secondary | ICD-10-CM

## 2020-07-07 HISTORY — DX: Other pulmonary embolism without acute cor pulmonale: I26.99

## 2020-07-07 HISTORY — DX: Acute embolism and thrombosis of unspecified deep veins of unspecified lower extremity: I82.409

## 2020-07-11 ENCOUNTER — Telehealth: Payer: Self-pay

## 2020-07-11 NOTE — Telephone Encounter (Signed)
[facility] called to report they are Pulling PICC and want to see if we need labs. Verbal to draw before pulling given.

## 2020-07-12 DIAGNOSIS — E782 Mixed hyperlipidemia: Secondary | ICD-10-CM | POA: Insufficient documentation

## 2020-07-12 DIAGNOSIS — I700 Atherosclerosis of aorta: Secondary | ICD-10-CM | POA: Insufficient documentation

## 2020-07-13 ENCOUNTER — Other Ambulatory Visit: Payer: Self-pay

## 2020-07-13 ENCOUNTER — Ambulatory Visit: Payer: Medicare PPO | Attending: Infectious Diseases | Admitting: Infectious Diseases

## 2020-07-13 VITALS — BP 128/80 | HR 106 | Temp 98.4°F | Resp 16 | Ht 67.0 in | Wt 194.8 lb

## 2020-07-13 DIAGNOSIS — I2699 Other pulmonary embolism without acute cor pulmonale: Secondary | ICD-10-CM | POA: Diagnosis not present

## 2020-07-13 DIAGNOSIS — R7881 Bacteremia: Secondary | ICD-10-CM | POA: Diagnosis not present

## 2020-07-13 DIAGNOSIS — A280 Pasteurellosis: Secondary | ICD-10-CM

## 2020-07-13 DIAGNOSIS — E119 Type 2 diabetes mellitus without complications: Secondary | ICD-10-CM | POA: Diagnosis not present

## 2020-07-13 DIAGNOSIS — Z79899 Other long term (current) drug therapy: Secondary | ICD-10-CM | POA: Insufficient documentation

## 2020-07-13 DIAGNOSIS — Z7984 Long term (current) use of oral hypoglycemic drugs: Secondary | ICD-10-CM | POA: Diagnosis not present

## 2020-07-13 DIAGNOSIS — Z5189 Encounter for other specified aftercare: Secondary | ICD-10-CM | POA: Insufficient documentation

## 2020-07-13 DIAGNOSIS — I82402 Acute embolism and thrombosis of unspecified deep veins of left lower extremity: Secondary | ICD-10-CM | POA: Diagnosis not present

## 2020-07-13 DIAGNOSIS — Z7901 Long term (current) use of anticoagulants: Secondary | ICD-10-CM | POA: Insufficient documentation

## 2020-07-13 DIAGNOSIS — Z87891 Personal history of nicotine dependence: Secondary | ICD-10-CM | POA: Diagnosis not present

## 2020-07-13 NOTE — Progress Notes (Signed)
NAME: Madison Cuevas  DOB: February 08, 1945  MRN: [id number]  Date/Time: 07/13/2020 11:00 AM  Patient is here with her husband. ? Madison Cuevas is a 75 y.o. female with history of diabetes mellitus here for follow-up after recent hospitalization for Pasteurella bacteremia.  Patient has a cat and dog.  She gets scratched all the time and recently had a scratch which was bleeding and was licked by her dog. When she got admitted to the hospital she had encephalopathy and was confused.  She was also diagnosed with PE and DVT in the leg.  During that hospitalization TEE was done and was negative for endocarditis.  A repeat blood culture from 06/29/2020 was negative for Pasteurella.  She was discharged home on IV ceftriaxone and p.o. Flagyl and also on Eliquis. She has completed her antibiotics and the PICC line has been removed. She is doing pretty well.  She has a little fatigue but has gone out recently was in [place] over the weekend.  She has no fever, nausea or vomiting or chills or confusion.  Past Medical History:  Diagnosis Date   Diabetes mellitus without complication (HCC)    GERD (gastroesophageal reflux disease)    Herpes zoster    History of chicken pox    Hyperlipidemia    Hypertension    pt denies.  meds are for kidney protection   Osteoarthrosis    Pneumonia    Wears dentures    lower partial    Past Surgical History:  Procedure Laterality Date   APPENDECTOMY     BACK SURGERY     CATARACT EXTRACTION W/PHACO Right 09/09/2018   Procedure: CATARACT EXTRACTION PHACO AND INTRAOCULAR LENS PLACEMENT (IOC)  RIGHT DIABETIC  01:01.5  16.2%  9.99;  Surgeon: Lockie Mola, MD;  Location: [facility];  Service: Ophthalmology;  Laterality: Right;  Diabetic - oral meds   COLONOSCOPY WITH PROPOFOL N/A 08/21/2015   Procedure: COLONOSCOPY WITH PROPOFOL;  Surgeon: Christena Deem, MD;  Location: [facility]COPY;  Service: Endoscopy;  Laterality: N/A;   ESOPHAGOGASTRODUODENOSCOPY  (EGD) WITH PROPOFOL N/A 06/21/2020   Procedure: ESOPHAGOGASTRODUODENOSCOPY (EGD) WITH PROPOFOL;  Surgeon: Toledo, Boykin Nearing, MD;  Location: [facility] ENDOSCOPY;  Service: Gastroenterology;  Laterality: N/A;   JOINT REPLACEMENT Right    knee   TEE WITHOUT CARDIOVERSION N/A 06/29/2020   Procedure: TRANSESOPHAGEAL ECHOCARDIOGRAM (TEE);  Surgeon: Lamar Blinks, MD;  Location: [facility] ORS;  Service: Cardiovascular;  Laterality: N/A;   TONSILLECTOMY     TUBAL LIGATION      Social History   Socioeconomic History   Marital status: Married    Spouse name: Not on file   Number of children: Not on file   Years of education: Not on file   Highest education level: Not on file  Occupational History   Not on file  Tobacco Use   Smoking status: Former    Packs/day: 1.00    Years: 20.00    Pack years: 20.00    Types: Cigarettes    Quit date: 1999    Years since quitting: 23.5   Smokeless tobacco: Never  Vaping Use   Vaping Use: Never used  Substance and Sexual Activity   Alcohol use: Yes    Comment: may have 1-2 drinks/month   Drug use: No   Sexual activity: Not Currently  Other Topics Concern   Not on file  Social History Narrative   Not on file   Social Determinants of Health   Financial Resource Strain: Not  on file  Food Insecurity: Not on file  Transportation Needs: Not on file  Physical Activity: Not on file  Stress: Not on file  Social Connections: Not on file  Intimate Partner Violence: Not on file    Family History  Problem Relation Age of Onset   Breast cancer Cousin    Allergies  Allergen Reactions   Adhesive [Tape] Rash    With extended use   I? Current Outpatient Medications  Medication Sig Dispense Refill   albuterol (VENTOLIN HFA) 108 (90 Base) MCG/ACT inhaler Inhale 1-2 puffs into the lungs every 6 (six) hours as needed for wheezing or shortness of breath. (Patient not taking: No sig reported)     apixaban (ELIQUIS) 5 MG TABS tablet Take 2 tablets (10 mg total)  by mouth 2 (two) times daily for 3 days. 12 tablet 0   apixaban (ELIQUIS) 5 MG TABS tablet Take 1 tablet (5 mg total) by mouth 2 (two) times daily. 60 tablet 0   Calcium-Vitamin D (CALTRATE 600 PLUS-VIT D PO) Take 1 tablet by mouth 2 (two) times daily.     glipiZIDE (GLUCOTROL XL) 5 MG 24 hr tablet Take 5 mg by mouth daily with breakfast.     losartan (COZAAR) 50 MG tablet Take 50 mg by mouth daily.     metFORMIN (GLUCOPHAGE) 500 MG tablet Take by mouth 2 (two) times daily with a meal.     montelukast (SINGULAIR) 10 MG tablet Take 10 mg by mouth daily as needed.     MULTIPLE VITAMIN PO Take 1 tablet by mouth daily.     MUPIROCIN EX Apply topically.     ondansetron (ZOFRAN ODT) 4 MG disintegrating tablet Take 1 tablet (4 mg total) by mouth every 8 (eight) hours as needed for nausea or vomiting. 20 tablet 0   pantoprazole (PROTONIX) 40 MG tablet Take 1 tablet (40 mg total) by mouth daily. 30 tablet 1   progesterone (PROMETRIUM) 100 MG capsule Take 100 mg by mouth daily.     psyllium (METAMUCIL) 58.6 % packet Take 1 packet by mouth daily.     simvastatin (ZOCOR) 20 MG tablet Take 20 mg by mouth daily.     sitaGLIPtin (JANUVIA) 100 MG tablet Take 100 mg by mouth daily.     tolterodine (DETROL LA) 4 MG 24 hr capsule Take 2 mg by mouth 2 (two) times daily.      venlafaxine (EFFEXOR) 75 MG tablet Take 75 mg by mouth daily.     No current facility-administered medications for this visit.     Abtx:  Anti-infectives (From admission, onward)    None       REVIEW OF SYSTEMS:  Const: negative fever, negative chills, negative weight loss Eyes: negative diplopia or visual changes, negative eye pain ENT: negative coryza, negative sore throat Resp: negative cough, hemoptysis, dyspnea Cards: negative for chest pain, palpitations, lower extremity edema GU: negative for frequency, dysuria and hematuria GI: Negative for abdominal pain, diarrhea, bleeding, constipation Skin: negative for rash and  pruritus Heme: negative for easy bruising and gum/nose bleeding MS: Some fatigue Neurolo:negative for headaches, dizziness, vertigo, memory problems  Psych: negative for feelings of anxiety, depression  Endocrine: Has diabetes mellitus Allergy/Immunology- negative for any medication or food allergies ?  Objective:  VITALS:  BP 128/80   Pulse (!) 106   Temp 98.4 F (36.9 C) (Oral)   Resp 16   Ht 5\' 7"  (1.702 m)   Wt 194 lb 12.8 oz (88.4 kg)  SpO2 95%   BMI 30.51 kg/m   PHYSICAL EXAM:  General: Alert, cooperative, no distress, appears stated age.  Head: Normocephalic, without obvious abnormality, atraumatic. Eyes: Conjunctivae clear, anicteric sclerae. Pupils are equal ENT Nares normal. No drainage or sinus tenderness. Lips, mucosa, and tongue normal. No Thrush Neck: Supple, symmetrical, no adenopathy, thyroid: non tender no carotid bruit and no JVD. Back: No CVA tenderness. Lungs: Clear to auscultation bilaterally. No Wheezing or Rhonchi. No rales. Heart: Regular rate and rhythm, no murmur, rub or gallop. Abdomen: Soft, non-tender,not distended. Bowel sounds normal. No masses Extremities: atraumatic, no cyanosis. No edema. No clubbing Skin: No rashes or lesions. Or bruising Lymph: Cervical, supraclavicular normal. Neurologic: Grossly non-focal Pertinent Labs Lab Results CBC    Component Value Date/Time   WBC 8.2 07/04/2020 1731   RBC 4.29 07/04/2020 1731   HGB 13.1 07/04/2020 1731   HGB 11.1 (L) 05/08/2011 0406   HCT 38.6 07/04/2020 1731   HCT 43.1 04/22/2011 1525   PLT 279 07/04/2020 1731   PLT 225 04/22/2011 1525   MCV 90.0 07/04/2020 1731   MCV 90 04/22/2011 1525   MCH 30.5 07/04/2020 1731   MCHC 33.9 07/04/2020 1731   RDW 12.2 07/04/2020 1731   RDW 13.0 04/22/2011 1525   LYMPHSABS 2.1 07/04/2020 1731   MONOABS 0.5 07/04/2020 1731   EOSABS 0.8 (H) 07/04/2020 1731   BASOSABS 0.1 07/04/2020 1731    CMP Latest Ref Rng & Units 07/04/2020 06/29/2020 06/28/2020   Glucose 70 - 99 mg/dL 829(F) 621(H) 086(V)  BUN 8 - 23 mg/dL 18 11 9   Creatinine 0.44 - 1.00 mg/dL 7.84 6.96  Sodium 135 - 145 mmol/L 139 140 134(L)  Potassium 3.5 - 5.1 mmol/L 3.9 4.0 3.2(L)  Chloride 98 - 111 mmol/L 104 106 102  CO2 22 - 32 mmol/L 28 26 28   Calcium 8.9 - 10.3 mg/dL 9.3 9.3 2.95)  Total Protein 6.5 - 8.1 g/dL 6.9 - -  Total Bilirubin 0.3 - 1.2 mg/dL 0.4 - -  Alkaline Phos 38 - 126 U/L 38 - -  AST 15 - 41 U/L 18 - -  ALT 0 - 44 U/L 11 - -   ? Impression/Recommendation Recent Pasteurella bacteremia treated with 10 days of IV antibiotics.  Has completed treatment and doing well.  Recent labs on 07/11/2020 was normal except for blood glucose.. She asked whether she could go to the gym.  I explained to her she can go to the gym, she can take the COVID- booster vaccine.  PE/left DVT on Eliquis.  Will follow with her PCP. She will check with her PCP and dentist regarding whether Eliquis will have to be stopped before dental cleaning which I do not think is required.  Diabetes mellitus needs better control.  Patient is currently on metformin, Januvia and glipizide.  Will follow her as needed ___________________________________________________ Discussed with patient, and her husband.  Note:  This document was prepared using Dragon voice recognition software and may include unintentional dictation errors.

## 2020-09-18 ENCOUNTER — Other Ambulatory Visit: Payer: Self-pay | Admitting: Family Medicine

## 2020-09-18 DIAGNOSIS — Z1231 Encounter for screening mammogram for malignant neoplasm of breast: Secondary | ICD-10-CM

## 2020-10-17 ENCOUNTER — Other Ambulatory Visit: Payer: Self-pay

## 2020-10-17 ENCOUNTER — Ambulatory Visit
Admission: RE | Admit: 2020-10-17 | Discharge: 2020-10-17 | Disposition: A | Discharge status: Home / Self Care | Payer: Medicare PPO | Source: Ambulatory Visit | Attending: Family Medicine | Admitting: Family Medicine

## 2020-10-17 DIAGNOSIS — Z1231 Encounter for screening mammogram for malignant neoplasm of breast: Secondary | ICD-10-CM

## 2021-01-17 DIAGNOSIS — I82432 Acute embolism and thrombosis of left popliteal vein: Secondary | ICD-10-CM | POA: Insufficient documentation

## 2021-09-04 ENCOUNTER — Other Ambulatory Visit: Payer: Self-pay | Admitting: Family Medicine

## 2021-09-04 DIAGNOSIS — Z1231 Encounter for screening mammogram for malignant neoplasm of breast: Secondary | ICD-10-CM

## 2021-10-10 ENCOUNTER — Encounter: Payer: Self-pay | Admitting: Ophthalmology

## 2021-10-15 NOTE — Discharge Instructions (Signed)

## 2021-10-16 NOTE — Anesthesia Preprocedure Evaluation (Signed)
Anesthesia Evaluation  Patient identified by MRN, date of birth, ID band Patient awake    Reviewed: Allergy & Precautions, NPO status , Patient's Chart, lab work & pertinent test results  Airway Mallampati: III  TM Distance: >3 FB Neck ROM: full    Dental  (+) Missing,    Pulmonary neg pulmonary ROS, former smoker,    Pulmonary exam normal        Cardiovascular Exercise Tolerance: Good Normal cardiovascular exam  DVT and PE 07/2020   Neuro/Psych negative neurological ROS  negative psych ROS   GI/Hepatic Neg liver ROS, GERD  Controlled and Medicated,  Endo/Other  diabetes, Poorly Controlled, Type 2, Oral Hypoglycemic Agents  Renal/GU      Musculoskeletal   Abdominal Normal abdominal exam  (+)   Peds  Hematology negative hematology ROS (+)   Anesthesia Other Findings Past Medical History: No date: Diabetes mellitus without complication ([place]) 62/7035: DVT (deep venous thrombosis) (HCC) No date: GERD (gastroesophageal reflux disease) No date: Herpes zoster No date: History of chicken pox No date: Hyperlipidemia No date: Hypertension     Comment:  pt denies.  meds are for kidney protection No date: Osteoarthrosis 07/2020: PE (pulmonary thromboembolism) ([place]) No date: Pneumonia 06/2020: Sepsis due to Gram negative bacteria (HCC)     Comment:  Pasturella Bacteremia No date: Vertigo No date: Wears dentures     Comment:  lower partial  Past Surgical History: No date: APPENDECTOMY No date: BACK SURGERY 09/09/2018: CATARACT EXTRACTION W/PHACO; Right     Comment:  Procedure: CATARACT EXTRACTION PHACO AND INTRAOCULAR               LENS PLACEMENT (IOC)  RIGHT DIABETIC  01:01.5  16.2%                9.99;  Surgeon: Leandrew Koyanagi, MD;  Location:               [facility];  Service: Ophthalmology;                Laterality: Right;  Diabetic - oral meds 08/21/2015: COLONOSCOPY WITH PROPOFOL; N/A      Comment:  Procedure: COLONOSCOPY WITH PROPOFOL;  Surgeon: Lollie Sails, MD;  Location: [facility];  Service:               Endoscopy;  Laterality: N/A; 06/21/2020: ESOPHAGOGASTRODUODENOSCOPY (EGD) WITH PROPOFOL; N/A     Comment:  Procedure: ESOPHAGOGASTRODUODENOSCOPY (EGD) WITH               PROPOFOL;  Surgeon: Toledo, Benay Pike, MD;  Location:               [facility] ENDOSCOPY;  Service: Gastroenterology;  Laterality:               N/A; No date: JOINT REPLACEMENT; Right     Comment:  knee 06/29/2020: TEE WITHOUT CARDIOVERSION; N/A     Comment:  Procedure: TRANSESOPHAGEAL ECHOCARDIOGRAM (TEE);                Surgeon: Corey Skains, MD;  Location: [facility] OR[facility];                Service: Cardiovascular;  Laterality: N/A; No date: TONSILLECTOMY No date: TUBAL LIGATION  BMI    Body Mass Index: 31.15 kg/m      Reproductive/Obstetrics negative OB ROS  Anesthesia Physical Anesthesia Plan  ASA: 3  Anesthesia Plan: MAC   Post-op Pain Management:    Induction: Intravenous  PONV Risk Score and Plan:   Airway Management Planned: Natural Airway and Nasal Cannula  Additional Equipment:   Intra-op Plan:   Post-operative Plan:   Informed Consent: I have reviewed the patients History and Physical, chart, labs and discussed the procedure including the risks, benefits and alternatives for the proposed anesthesia with the patient or authorized representative who has indicated his/her understanding and acceptance.       Plan Discussed with: Anesthesiologist, CRNA and Surgeon  Anesthesia Plan Comments:        Anesthesia Quick Evaluation

## 2021-10-17 ENCOUNTER — Ambulatory Visit: Payer: Medicare PPO | Admitting: Anesthesiology

## 2021-10-17 ENCOUNTER — Encounter
Admission: RE | Disposition: A | Discharge status: Home / Self Care | Payer: Self-pay | Source: Home / Self Care | Attending: Ophthalmology

## 2021-10-17 ENCOUNTER — Ambulatory Visit
Admission: RE | Admit: 2021-10-17 | Discharge: 2021-10-17 | Disposition: A | Discharge status: Home / Self Care | Payer: Medicare PPO | Attending: Ophthalmology | Admitting: Ophthalmology

## 2021-10-17 ENCOUNTER — Encounter: Payer: Self-pay | Admitting: Ophthalmology

## 2021-10-17 ENCOUNTER — Other Ambulatory Visit: Payer: Self-pay

## 2021-10-17 DIAGNOSIS — Z8619 Personal history of other infectious and parasitic diseases: Secondary | ICD-10-CM | POA: Insufficient documentation

## 2021-10-17 DIAGNOSIS — Z86711 Personal history of pulmonary embolism: Secondary | ICD-10-CM | POA: Diagnosis not present

## 2021-10-17 DIAGNOSIS — Z87891 Personal history of nicotine dependence: Secondary | ICD-10-CM | POA: Insufficient documentation

## 2021-10-17 DIAGNOSIS — E785 Hyperlipidemia, unspecified: Secondary | ICD-10-CM | POA: Diagnosis not present

## 2021-10-17 DIAGNOSIS — Z96651 Presence of right artificial knee joint: Secondary | ICD-10-CM | POA: Insufficient documentation

## 2021-10-17 DIAGNOSIS — E1136 Type 2 diabetes mellitus with diabetic cataract: Secondary | ICD-10-CM | POA: Diagnosis present

## 2021-10-17 DIAGNOSIS — H2512 Age-related nuclear cataract, left eye: Secondary | ICD-10-CM | POA: Insufficient documentation

## 2021-10-17 DIAGNOSIS — Z7984 Long term (current) use of oral hypoglycemic drugs: Secondary | ICD-10-CM | POA: Insufficient documentation

## 2021-10-17 DIAGNOSIS — Z86718 Personal history of other venous thrombosis and embolism: Secondary | ICD-10-CM | POA: Diagnosis not present

## 2021-10-17 DIAGNOSIS — K219 Gastro-esophageal reflux disease without esophagitis: Secondary | ICD-10-CM | POA: Insufficient documentation

## 2021-10-17 HISTORY — PX: CATARACT EXTRACTION W/PHACO: SHX586

## 2021-10-17 HISTORY — DX: Dizziness and giddiness: R42

## 2021-10-17 LAB — GLUCOSE, CAPILLARY
Glucose-Capillary: 167 mg/dL — ABNORMAL HIGH (ref 70–99)
Glucose-Capillary: 157 mg/dL — ABNORMAL HIGH (ref 70–99)

## 2021-10-17 SURGERY — PHACOEMULSIFICATION, CATARACT, WITH IOL INSERTION
Anesthesia: Monitor Anesthesia Care | Site: Eye | Laterality: Left

## 2021-10-17 MED ORDER — TETRACAINE HCL 0.5 % OP SOLN
1.0000 [drp] | OPHTHALMIC | Status: DC | PRN
  Administered 2021-10-17 (×3): 1 [drp] via OPHTHALMIC

## 2021-10-17 MED ORDER — ARMC OPHTHALMIC DILATING DROPS
1.0000 | OPHTHALMIC | Status: DC | PRN
  Administered 2021-10-17 (×3): 1 via OPHTHALMIC

## 2021-10-17 MED ORDER — MIDAZOLAM HCL 2 MG/2ML IJ SOLN
INTRAMUSCULAR | Status: DC | PRN
  Administered 2021-10-17: 1 mg via INTRAVENOUS

## 2021-10-17 MED ORDER — FENTANYL CITRATE (PF) 100 MCG/2ML IJ SOLN
INTRAMUSCULAR | Status: DC | PRN
  Administered 2021-10-17 (×2): 50 ug via INTRAVENOUS

## 2021-10-17 MED ORDER — SIGHTPATH DOSE#1 BSS IO SOLN
INTRAOCULAR | Status: DC | PRN
  Administered 2021-10-17: 1 mL via INTRAMUSCULAR

## 2021-10-17 MED ORDER — MOXIFLOXACIN HCL 0.5 % OP SOLN: OPHTHALMIC | Status: DC | PRN

## 2021-10-17 MED ORDER — SIGHTPATH DOSE#1 BSS IO SOLN
INTRAOCULAR | Status: DC | PRN
  Administered 2021-10-17: 15 mL

## 2021-10-17 MED ORDER — SIGHTPATH DOSE#1 NA HYALUR & NA CHOND-NA HYALUR IO KIT
PACK | INTRAOCULAR | Status: DC | PRN
  Administered 2021-10-17: 1 via OPHTHALMIC

## 2021-10-17 MED ORDER — SIGHTPATH DOSE#1 BSS IO SOLN
INTRAOCULAR | Status: DC | PRN
  Administered 2021-10-17: 66 mL via OPHTHALMIC

## 2021-10-17 MED ORDER — BRIMONIDINE TARTRATE-TIMOLOL 0.2-0.5 % OP SOLN
OPHTHALMIC | Status: DC | PRN
  Administered 2021-10-17: 1 [drp] via OPHTHALMIC

## 2021-10-17 MED ORDER — LACTATED RINGERS IV SOLN: INTRAVENOUS | Status: DC

## 2021-10-17 MED ORDER — CEFUROXIME OPHTHALMIC INJECTION 1 MG/0.1 ML
INJECTION | OPHTHALMIC | Status: DC | PRN
  Administered 2021-10-17: 1 mg via OPHTHALMIC

## 2021-10-17 SURGICAL SUPPLY — 10 items
CATARACT SUITE SIGHTPATH (MISCELLANEOUS) ×1 IMPLANT
FEE CATARACT SUITE SIGHTPATH (MISCELLANEOUS) ×1 IMPLANT
GLOVE SRG 8 PF TXTR STRL LF DI (GLOVE) ×1 IMPLANT
GLOVE SURG ENC TEXT LTX SZ7.5 (GLOVE) ×1 IMPLANT
GLOVE SURG UNDER POLY LF SZ8 (GLOVE) ×1
LENS IOL TECNIS EYHANCE 19.5 (Intraocular Lens) IMPLANT
NDL FILTER BLUNT 18X1 1/2 (NEEDLE) ×1 IMPLANT
NEEDLE FILTER BLUNT 18X1 1/2 (NEEDLE) ×1 IMPLANT
SYR 3ML LL SCALE MARK (SYRINGE) ×1 IMPLANT
WATER STERILE IRR 250ML POUR (IV SOLUTION) ×1 IMPLANT

## 2021-10-17 NOTE — Op Note (Signed)
OPERATIVE NOTE  Madison Cuevas [id number] 10/17/2021   PREOPERATIVE DIAGNOSIS:  Nuclear sclerotic cataract left eye. H25.12   POSTOPERATIVE DIAGNOSIS:    Nuclear sclerotic cataract left eye.     PROCEDURE:  Phacoemusification with posterior chamber intraocular lens placement of the left eye  Ultrasound time: Procedure(s) with comments: CATARACT EXTRACTION PHACO AND INTRAOCULAR LENS PLACEMENT (IOC) LEFT DIABETIC (Left) - Diabetic 11.78 01:11.2  LENS:   Implant Name Type Inv. Item Serial No. Manufacturer Lot No. LRB No. Used Action  LENS IOL TECNIS EYHANCE 19.5 - [id number] Intraocular Lens LENS IOL TECNIS EYHANCE 19.5 [id number] [employer]  Left 1 Implanted      SURGEON:  Wyonia Hough, MD   ANESTHESIA:  Topical with tetracaine drops and 2% Xylocaine jelly, augmented with 1% preservative-free intracameral lidocaine.    COMPLICATIONS:  None.   DESCRIPTION OF PROCEDURE:  The patient was identified in the holding room and transported to the operating room and placed in the supine position under the operating microscope.  The left eye was identified as the operative eye and it was prepped and draped in the usual sterile ophthalmic fashion.   A 1 millimeter clear-corneal paracentesis was made at the 1:30 position.  0.5 ml of preservative-free 1% lidocaine was injected into the anterior chamber.  The anterior chamber was filled with Viscoat viscoelastic.  A 2.4 millimeter keratome was used to make a near-clear corneal incision at the 10:30 position.  .  A curvilinear capsulorrhexis was made with a cystotome and capsulorrhexis forceps.  Balanced salt solution was used to hydrodissect and hydrodelineate the nucleus.   Phacoemulsification was then used in stop and chop fashion to remove the lens nucleus and epinucleus.  The remaining cortex was then removed using the irrigation and aspiration handpiece. Provisc was then placed into the capsular bag to distend it for lens placement.  A  lens was then injected into the capsular bag.  The remaining viscoelastic was aspirated.   Wounds were hydrated with balanced salt solution.  The anterior chamber was inflated to a physiologic pressure with balanced salt solution.  No wound leaks were noted. Cefuroxime 0.1 ml of a 10mg /ml solution was injected into the anterior chamber for a dose of 1 mg of intracameral antibiotic at the completion of the case.   Timolol and Brimonidine drops were applied to the eye.  The patient was taken to the recovery room in stable condition without complications of anesthesia or surgery.  [name] 10/17/2021, 8:27 AM

## 2021-10-17 NOTE — Anesthesia Postprocedure Evaluation (Signed)
Anesthesia Post Note  Patient: Madison Cuevas  Procedure(s) Performed: CATARACT EXTRACTION PHACO AND INTRAOCULAR LENS PLACEMENT (IOC) LEFT DIABETIC (Left: Eye)  Patient location during evaluation: PACU Anesthesia Type: MAC Level of consciousness: awake and alert Pain management: pain level controlled Vital Signs Assessment: post-procedure vital signs reviewed and stable Respiratory status: spontaneous breathing, nonlabored ventilation and respiratory function stable Cardiovascular status: blood pressure returned to baseline and stable Postop Assessment: no apparent nausea or vomiting Anesthetic complications: no   There were no known notable events for this encounter.   Last Vitals:  Vitals:   10/17/21 0832 10/17/21 0833  BP:  123/60  Pulse: 92 95  Resp: 13 14  Temp:    SpO2: 94% 93%    Last Pain:  Vitals:   10/17/21 0833  TempSrc:   PainSc: 0-No pain                 Iran Ouch

## 2021-10-17 NOTE — Transfer of Care (Signed)
Immediate Anesthesia Transfer of Care Note  Patient: Madison Cuevas  Procedure(s) Performed: CATARACT EXTRACTION PHACO AND INTRAOCULAR LENS PLACEMENT (IOC) LEFT DIABETIC (Left: Eye)  Patient Location: PACU  Anesthesia Type: MAC  Level of Consciousness: awake, alert  and patient cooperative  Airway and Oxygen Therapy: Patient Spontanous Breathing and Patient connected to supplemental oxygen  Post-op Assessment: Post-op Vital signs reviewed, Patient's Cardiovascular Status Stable, Respiratory Function Stable, Patent Airway and No signs of Nausea or vomiting  Post-op Vital Signs: Reviewed and stable  Complications: There were no known notable events for this encounter.

## 2021-10-17 NOTE — H&P (Signed)
Madison Cuevas   Primary Care Physician:  Derinda Late, MD Ophthalmologist: Dr. Leandrew Koyanagi  Pre-Procedure History & Physical: HPI:  Madison Cuevas is a 77 y.o. female here for ophthalmic surgery.   Past Medical History:  Diagnosis Date   Diabetes mellitus without complication ([place])    DVT (deep venous thrombosis) ([place]) 07/2020   GERD (gastroesophageal reflux disease)    Herpes zoster    History of chicken pox    Hyperlipidemia    Hypertension    pt denies.  meds are for kidney protection   Osteoarthrosis    PE (pulmonary thromboembolism) ([place]) 07/2020   Pneumonia    Sepsis due to Gram negative bacteria ([place]) 06/2020   Pasturella Bacteremia   Vertigo    Wears dentures    lower partial    Past Surgical History:  Procedure Laterality Date   APPENDECTOMY     BACK SURGERY     CATARACT EXTRACTION W/PHACO Right 09/09/2018   Procedure: CATARACT EXTRACTION PHACO AND INTRAOCULAR LENS PLACEMENT (IOC)  RIGHT DIABETIC  01:01.5  16.2%  9.99;  Surgeon: Leandrew Koyanagi, MD;  Location: [place];  Service: Ophthalmology;  Laterality: Right;  Diabetic - oral meds   COLONOSCOPY WITH PROPOFOL N/A 08/21/2015   Procedure: COLONOSCOPY WITH PROPOFOL;  Surgeon: Lollie Sails, MD;  Location: [facility] ENDOSCOPY;  Service: Endoscopy;  Laterality: N/A;   ESOPHAGOGASTRODUODENOSCOPY (EGD) WITH PROPOFOL N/A 06/21/2020   Procedure: ESOPHAGOGASTRODUODENOSCOPY (EGD) WITH PROPOFOL;  Surgeon: Toledo, Benay Pike, MD;  Location: [facility] ENDOSCOPY;  Service: Gastroenterology;  Laterality: N/A;   JOINT REPLACEMENT Right    knee   TEE WITHOUT CARDIOVERSION N/A 06/29/2020   Procedure: TRANSESOPHAGEAL ECHOCARDIOGRAM (TEE);  Surgeon: Corey Skains, MD;  Location: [facility]S;  Service: Cardiovascular;  Laterality: N/A;   TONSILLECTOMY     TUBAL LIGATION      Prior to Admission medications   Medication Sig Start Date End Date Taking? Authorizing Provider  albuterol (VENTOLIN HFA) 108 (90  Base) MCG/ACT inhaler Inhale 1-2 puffs into the lungs every 6 (six) hours as needed for wheezing or shortness of breath.   Yes [provider]  Calcium-Vitamin D (CALTRATE 600 PLUS-VIT D PO) Take 1 tablet by mouth 2 (two) times daily.   Yes [provider]  Dulaglutide (TRULICITY) 3 XK/4.8JE SOPN Inject into the skin once a week. Tuesday   Yes [provider]  glipiZIDE (GLUCOTROL XL) 5 MG 24 hr tablet Take 5 mg by mouth daily with breakfast.   Yes [provider]  losartan (COZAAR) 50 MG tablet Take 50 mg by mouth daily.   Yes [provider]  metFORMIN (GLUCOPHAGE) 500 MG tablet Take by mouth 2 (two) times daily with a meal.   Yes [provider]  MULTIPLE VITAMIN PO Take 1 tablet by mouth daily.   Yes [provider]  MUPIROCIN EX Apply topically.   Yes [provider]  pantoprazole (PROTONIX) 40 MG tablet Take 1 tablet (40 mg total) by mouth daily. 05/23/20 10/17/21 Yes Harvest Dark, MD  progesterone (PROMETRIUM) 100 MG capsule Take 100 mg by mouth daily.   Yes [provider]  simvastatin (ZOCOR) 20 MG tablet Take 40 mg by mouth daily.   Yes [provider]  tolterodine (DETROL LA) 4 MG 24 hr capsule Take 2 mg by mouth 2 (two) times daily.    Yes [provider]  venlafaxine (EFFEXOR) 75 MG tablet Take 75 mg by mouth daily.   Yes [provider]    Allergies as of 08/27/2021 - Review Complete 07/13/2020  Allergen Reaction Noted   Adhesive [tape] Rash 09/03/2018    Family History  Problem Relation Age of Onset   Breast cancer Cousin     Social History   Socioeconomic History   Marital status: Married    Spouse name: Not on file   Number of children: Not on file   Years of education: Not on file   Highest education level: Not on file  Occupational History   Not on file  Tobacco Use   Smoking status: Former    Packs/day: 1.00    Years: 20.00    Total pack years:  20.00    Types: Cigarettes    Quit date: 1999    Years since quitting: 24.7   Smokeless tobacco: Never  Vaping Use   Vaping Use: Never used  Substance and Sexual Activity   Alcohol use: Not Currently    Comment: may have 1-2 drinks/month   Drug use: No   Sexual activity: Not Currently  Other Topics Concern   Not on file  Social History Narrative   Not on file   Social Determinants of Health   Financial Resource Strain: Not on file  Food Insecurity: Not on file  Transportation Needs: Not on file  Physical Activity: Not on file  Stress: Not on file  Social Connections: Not on file  Intimate Partner Violence: Not on file    Review of Systems: See HPI, otherwise negative ROS  Physical Exam: BP 129/68   Temp (!) 97.2 F (36.2 C) (Tympanic)   Resp 19   Ht 5' 6.38" (1.686 m)   Wt 84.9 kg   SpO2 94%   BMI 29.87 kg/m  General:   Alert,  pleasant and cooperative in NAD Head:  Normocephalic and atraumatic. Lungs:  Clear to auscultation.    Heart:  Regular rate and rhythm.   Impression/Plan: Madison Cuevas is here for ophthalmic surgery.  Risks, benefits, limitations, and alternatives regarding ophthalmic surgery have been reviewed with the patient.  Questions have been answered.  All parties agreeable.   Madison Mola, MD  10/17/2021, 7:36 AM

## 2021-10-18 ENCOUNTER — Encounter: Payer: Self-pay | Admitting: Ophthalmology

## 2021-10-28 ENCOUNTER — Encounter: Payer: Self-pay | Admitting: Emergency Medicine

## 2021-10-28 ENCOUNTER — Emergency Department: Payer: Medicare PPO

## 2021-10-28 ENCOUNTER — Observation Stay
Admission: EM | Admit: 2021-10-28 | Discharge: 2021-10-30 | Disposition: A | Discharge status: Home / Self Care | Payer: Medicare PPO | Attending: Internal Medicine | Admitting: Internal Medicine

## 2021-10-28 ENCOUNTER — Other Ambulatory Visit: Payer: Self-pay

## 2021-10-28 DIAGNOSIS — Z87891 Personal history of nicotine dependence: Secondary | ICD-10-CM | POA: Diagnosis not present

## 2021-10-28 DIAGNOSIS — Z79899 Other long term (current) drug therapy: Secondary | ICD-10-CM | POA: Insufficient documentation

## 2021-10-28 DIAGNOSIS — E785 Hyperlipidemia, unspecified: Secondary | ICD-10-CM | POA: Insufficient documentation

## 2021-10-28 DIAGNOSIS — I700 Atherosclerosis of aorta: Secondary | ICD-10-CM | POA: Insufficient documentation

## 2021-10-28 DIAGNOSIS — I10 Essential (primary) hypertension: Secondary | ICD-10-CM | POA: Insufficient documentation

## 2021-10-28 DIAGNOSIS — Z86718 Personal history of other venous thrombosis and embolism: Secondary | ICD-10-CM

## 2021-10-28 DIAGNOSIS — E119 Type 2 diabetes mellitus without complications: Secondary | ICD-10-CM

## 2021-10-28 DIAGNOSIS — I493 Ventricular premature depolarization: Secondary | ICD-10-CM | POA: Insufficient documentation

## 2021-10-28 DIAGNOSIS — I340 Nonrheumatic mitral (valve) insufficiency: Secondary | ICD-10-CM | POA: Insufficient documentation

## 2021-10-28 DIAGNOSIS — G9341 Metabolic encephalopathy: Secondary | ICD-10-CM | POA: Diagnosis not present

## 2021-10-28 DIAGNOSIS — Z7985 Long-term (current) use of injectable non-insulin antidiabetic drugs: Secondary | ICD-10-CM | POA: Diagnosis not present

## 2021-10-28 DIAGNOSIS — Z7984 Long term (current) use of oral hypoglycemic drugs: Secondary | ICD-10-CM | POA: Insufficient documentation

## 2021-10-28 DIAGNOSIS — Z7901 Long term (current) use of anticoagulants: Secondary | ICD-10-CM | POA: Insufficient documentation

## 2021-10-28 DIAGNOSIS — R471 Dysarthria and anarthria: Principal | ICD-10-CM

## 2021-10-28 DIAGNOSIS — R531 Weakness: Secondary | ICD-10-CM | POA: Diagnosis present

## 2021-10-28 DIAGNOSIS — Z96651 Presence of right artificial knee joint: Secondary | ICD-10-CM | POA: Insufficient documentation

## 2021-10-28 DIAGNOSIS — I951 Orthostatic hypotension: Secondary | ICD-10-CM | POA: Diagnosis not present

## 2021-10-28 DIAGNOSIS — I2510 Atherosclerotic heart disease of native coronary artery without angina pectoris: Secondary | ICD-10-CM | POA: Diagnosis not present

## 2021-10-28 DIAGNOSIS — Z86711 Personal history of pulmonary embolism: Secondary | ICD-10-CM | POA: Diagnosis not present

## 2021-10-28 DIAGNOSIS — R4781 Slurred speech: Secondary | ICD-10-CM

## 2021-10-28 DIAGNOSIS — N3281 Overactive bladder: Secondary | ICD-10-CM | POA: Insufficient documentation

## 2021-10-28 HISTORY — DX: Metabolic encephalopathy: G93.41

## 2021-10-28 LAB — COMPREHENSIVE METABOLIC PANEL
ALT: 10 U/L (ref 0–44)
AST: 12 U/L — ABNORMAL LOW (ref 15–41)
Albumin: 3.1 g/dL — ABNORMAL LOW (ref 3.5–5.0)
Alkaline Phosphatase: 40 U/L (ref 38–126)
Anion gap: 7 (ref 5–15)
BUN: 13 mg/dL (ref 8–23)
CO2: 26 mmol/L (ref 22–32)
Calcium: 8.5 mg/dL — ABNORMAL LOW (ref 8.9–10.3)
Chloride: 104 mmol/L (ref 98–111)
Creatinine, Ser: 0.82 mg/dL (ref 0.44–1.00)
GFR, Estimated: >60 mL/min (ref >60)
Glucose, Bld: 198 mg/dL — ABNORMAL HIGH (ref 70–99)
Potassium: 3.7 mmol/L (ref 3.5–5.1)
Sodium: 137 mmol/L (ref 135–145)
Total Bilirubin: 0.9 mg/dL (ref 0.3–1.2)
Total Protein: 6.1 g/dL — ABNORMAL LOW (ref 6.5–8.1)

## 2021-10-28 LAB — CBC
HCT: 38.5 % (ref 36.0–46.0)
Hemoglobin: 12.7 g/dL (ref 12.0–15.0)
MCH: 29.5 pg (ref 26.0–34.0)
MCHC: 33 g/dL (ref 30.0–36.0)
MCV: 89.3 fL (ref 80.0–100.0)
Platelets: 222 10*3/uL (ref 150–400)
RBC: 4.31 MIL/uL (ref 3.87–5.11)
RDW: 12.3 % (ref 11.5–15.5)
WBC: 12.7 10*3/uL — ABNORMAL HIGH (ref 4.0–10.5)
nRBC: 0 % (ref 0.0–0.2)

## 2021-10-28 LAB — TROPONIN I (HIGH SENSITIVITY)
Troponin I (High Sensitivity): 16 ng/L (ref <18)
Troponin I (High Sensitivity): 16 ng/L (ref <18)

## 2021-10-28 LAB — DIFFERENTIAL
Abs Immature Granulocytes: 0.1 10*3/uL — ABNORMAL HIGH (ref 0.00–0.07)
Basophils Absolute: 0.1 10*3/uL (ref 0.0–0.1)
Basophils Relative: 1 %
Eosinophils Absolute: 0.7 10*3/uL — ABNORMAL HIGH (ref 0.0–0.5)
Eosinophils Relative: 5 %
Immature Granulocytes: 1 %
Lymphocytes Relative: 18 %
Lymphs Abs: 2.3 10*3/uL (ref 0.7–4.0)
Monocytes Absolute: 0.9 10*3/uL (ref 0.1–1.0)
Monocytes Relative: 7 %
Neutro Abs: 8.6 10*3/uL — ABNORMAL HIGH (ref 1.7–7.7)
Neutrophils Relative %: 68 %

## 2021-10-28 LAB — CBG MONITORING, ED: Glucose-Capillary: 153 mg/dL — ABNORMAL HIGH (ref 70–99)

## 2021-10-28 LAB — ETHANOL: Alcohol, Ethyl (B): <10 mg/dL (ref <10)

## 2021-10-28 LAB — PROTIME-INR
INR: 1.2 (ref 0.8–1.2)
Prothrombin Time: 14.7 seconds (ref 11.4–15.2)

## 2021-10-28 LAB — APTT: aPTT: 27 seconds (ref 24–36)

## 2021-10-28 MED ORDER — SODIUM CHLORIDE 0.9 % IV BOLUS
1000.0000 mL | Freq: Once | INTRAVENOUS | Status: AC
  Administered 2021-10-28: 1000 mL via INTRAVENOUS

## 2021-10-28 NOTE — ED Notes (Signed)
Patient transported to MRI 

## 2021-10-28 NOTE — ED Provider Notes (Signed)
[facility] Provider Note    Event Date/Time   First MD Initiated Contact with Patient 10/28/21 2117     (approximate)   History   Weakness and Altered Mental Status   HPI  Madison Cuevas is a 76 y.o. female who presents to the emergency department today because of concerns for slurred speech and weakness.  Patient states that she has been having issues with diarrhea over the past 2 weeks.  This started when she started Eliquis.  Has been put on Eliquis for previous blood clots.  She has not noticed any blood in the diarrhea.  Today at around 3:00 this afternoon husband noticed slurred speech.  He also noticed that she was having difficulty with her ambulation.  His slurred speech has improved by the time my exam.  Patient has not had similar symptoms in the past.  She denies any headaches.      Physical Exam   Triage Vital Signs: ED Triage Vitals  Enc Vitals Group     BP 10/28/21 2057 113/61     Pulse Rate 10/28/21 2057 (!) 39     Resp 10/28/21 2057 17     Temp 10/28/21 2057 97.7 F (36.5 C)     Temp Source 10/28/21 2057 Oral     SpO2 10/28/21 2057 95 %     Weight --      Height 10/28/21 2059 5\' 6"  (1.676 m)     Head Circumference --      Peak Flow --      Pain Score --      Pain Loc --      Pain Edu? --      Excl. in [place]? --     Most recent vital signs: Vitals:   10/28/21 2057  BP: 113/61  Pulse: (!) 39  Resp: 17  Temp: 97.7 F (36.5 C)  SpO2: 95%   General: Awake, alert, oriented. CV:  Good peripheral perfusion. Tachycardia. Resp:  Normal effort. Lungs clear. Abd:  No distention.   ED Results / Procedures / Treatments   Labs (all labs ordered are listed, but only abnormal results are displayed) Labs Reviewed  CBG MONITORING, ED - Abnormal; Notable for the following components:      Result Value   Glucose-Capillary 153 (*)    All other components within normal limits  PROTIME-INR  APTT  CBC  DIFFERENTIAL  COMPREHENSIVE  METABOLIC PANEL  ETHANOL  I-STAT CREATININE, ED  CBG MONITORING, ED  TROPONIN I (HIGH SENSITIVITY)     EKG  I, Nance Pear, attending physician, personally viewed and interpreted this EKG  EKG Time: 2053 Rate: 130 Rhythm: sinus tachycardia with PVC Axis: normal Intervals: qtc 447 QRS: narrow, q waves v1 ST changes: no st elevation Impression: abnormal ekg    RADIOLOGY I independently interpreted and visualized the CT head. My interpretation: No bleed Radiology interpretation:  IMPRESSION:  No acute intracranial abnormalities. Chronic atrophy and mild small  vessel ischemic changes.   I independently interpreted and visualized the MR brain. My interpretation: No large mass Radiology interpretation: IMPRESSION:  1. No acute intracranial abnormality.  2. Mild age-related cerebral atrophy with chronic small vessel  ischemic disease.     PROCEDURES:  Critical Care performed: No  Procedures   MEDICATIONS ORDERED IN ED: Medications - No data to display   IMPRESSION / MDM / [place] / ED COURSE  I reviewed the triage vital signs and the nursing notes.  Differential diagnosis includes, but is not limited to, CVA, intracranial bleed, electrolyte abnormality, dehydration.  Patient's presentation is most consistent with acute presentation with potential threat to life or bodily function.  The patient is on the cardiac monitor to evaluate for evidence of arrhythmia and/or significant heart rate changes.  Patient presents to the emergency department today because of concerns for strokelike symptoms.  At the time my exam patient no longer with any slurred speech.  CT head without any concerning abnormalities.  Will obtain blood work to evaluate for electrolyte abnormality or anemia given history of significant diarrhea over the past 2 weeks.  Additionally will obtain MRI. Will give IVFs. MRI without evidence of acute stroke. At  this time do have concern for TIA. Discussed this with patient and family. Will plan on admission for further work up and evaluation. Discussed with Dr. Frederick Peers with the hospitalist service who will plan on admission.  FINAL CLINICAL IMPRESSION(S) / ED DIAGNOSES   Final diagnoses:  Slurred speech      Note:  This document was prepared using Dragon voice recognition software and may include unintentional dictation errors.    Phineas Semen, MD 10/28/21 [phone]

## 2021-10-28 NOTE — ED Notes (Signed)
First Nurse Note: Pt endorses back pain, bilateral shoulder pain, and dizziness. Per Spouse, he states that the patient has had slurred speech and is a fall risk. Pt disagrees and states that she "speaks fine and is not a fall risk".

## 2021-10-28 NOTE — ED Triage Notes (Signed)
Pt in triage with spouse who reports pt has had N/V/D x 2 weeks and has been seen by PCP. Today spouse noticed slurred speech, AMS and weakness that he sts started at 1500. No facial droop, drift or slurred speech in triage. Pt present with memory impairment and only oriented to self.

## 2021-10-29 ENCOUNTER — Observation Stay
Admit: 2021-10-29 | Discharge: 2021-10-29 | Disposition: A | Discharge status: Home / Self Care | Payer: Medicare PPO | Attending: Internal Medicine | Admitting: Internal Medicine

## 2021-10-29 ENCOUNTER — Observation Stay: Payer: Medicare PPO

## 2021-10-29 DIAGNOSIS — R4781 Slurred speech: Secondary | ICD-10-CM

## 2021-10-29 DIAGNOSIS — N3281 Overactive bladder: Secondary | ICD-10-CM | POA: Insufficient documentation

## 2021-10-29 DIAGNOSIS — G9341 Metabolic encephalopathy: Secondary | ICD-10-CM

## 2021-10-29 DIAGNOSIS — R471 Dysarthria and anarthria: Secondary | ICD-10-CM

## 2021-10-29 DIAGNOSIS — G459 Transient cerebral ischemic attack, unspecified: Secondary | ICD-10-CM | POA: Diagnosis not present

## 2021-10-29 DIAGNOSIS — E119 Type 2 diabetes mellitus without complications: Secondary | ICD-10-CM

## 2021-10-29 DIAGNOSIS — Z86718 Personal history of other venous thrombosis and embolism: Secondary | ICD-10-CM

## 2021-10-29 LAB — ECHOCARDIOGRAM COMPLETE
Calc EF: 40.1 %
Height: 66 in
Single Plane A2C EF: 39.2 %
Single Plane A4C EF: 34.4 %

## 2021-10-29 LAB — GLUCOSE, CAPILLARY
Glucose-Capillary: 160 mg/dL — ABNORMAL HIGH (ref 70–99)
Glucose-Capillary: 179 mg/dL — ABNORMAL HIGH (ref 70–99)
Glucose-Capillary: 180 mg/dL — ABNORMAL HIGH (ref 70–99)

## 2021-10-29 LAB — LIPID PANEL
Cholesterol: 91 mg/dL (ref 0–200)
HDL: 39 mg/dL — ABNORMAL LOW (ref >40)
LDL Cholesterol: 34 mg/dL (ref 0–99)
Total CHOL/HDL Ratio: 2.3 RATIO
Triglycerides: 88 mg/dL (ref <150)
VLDL: 18 mg/dL (ref 0–40)

## 2021-10-29 LAB — HEMOGLOBIN A1C
Hgb A1c MFr Bld: 7.5 % — ABNORMAL HIGH (ref 4.8–5.6)
Mean Plasma Glucose: 168.55 mg/dL

## 2021-10-29 LAB — CBG MONITORING, ED
Glucose-Capillary: 206 mg/dL — ABNORMAL HIGH (ref 70–99)
Glucose-Capillary: 142 mg/dL — ABNORMAL HIGH (ref 70–99)

## 2021-10-29 LAB — TSH: TSH: 1.155 u[IU]/mL (ref 0.350–4.500)

## 2021-10-29 MED ORDER — STROKE: EARLY STAGES OF RECOVERY BOOK: Freq: Once | Status: AC

## 2021-10-29 MED ORDER — INSULIN ASPART 100 UNIT/ML IJ SOLN
0.0000 [IU] | Freq: Three times a day (TID) | INTRAMUSCULAR | Status: DC
  Administered 2021-10-29: 3 [IU] via SUBCUTANEOUS
  Administered 2021-10-29: 2 [IU] via SUBCUTANEOUS
  Administered 2021-10-29 – 2021-10-30 (×3): 3 [IU] via SUBCUTANEOUS
  Filled 2021-10-29 (×5): qty 1

## 2021-10-29 MED ORDER — ACETAMINOPHEN 160 MG/5ML PO SOLN: 650.0000 mg | ORAL | Status: DC | PRN

## 2021-10-29 MED ORDER — ASPIRIN 81 MG PO TBEC
81.0000 mg | DELAYED_RELEASE_TABLET | Freq: Every day | ORAL | Status: DC
  Administered 2021-10-29: 81 mg via ORAL
  Filled 2021-10-29: qty 1

## 2021-10-29 MED ORDER — FESOTERODINE FUMARATE ER 4 MG PO TB24
4.0000 mg | ORAL_TABLET | Freq: Every day | ORAL | Status: DC
  Administered 2021-10-29 – 2021-10-30 (×2): 4 mg via ORAL
  Filled 2021-10-29 (×2): qty 1

## 2021-10-29 MED ORDER — SODIUM CHLORIDE 0.9 % IV SOLN: INTRAVENOUS | Status: DC

## 2021-10-29 MED ORDER — IOHEXOL 350 MG/ML SOLN
75.0000 mL | Freq: Once | INTRAVENOUS | Status: AC | PRN
  Administered 2021-10-29: 75 mL via INTRAVENOUS

## 2021-10-29 MED ORDER — ACETAMINOPHEN 650 MG RE SUPP: 650.0000 mg | RECTAL | Status: DC | PRN

## 2021-10-29 MED ORDER — INSULIN ASPART 100 UNIT/ML IJ SOLN
0.0000 [IU] | Freq: Every day | INTRAMUSCULAR | Status: DC
  Administered 2021-10-29: 2 [IU] via SUBCUTANEOUS
  Filled 2021-10-29: qty 1

## 2021-10-29 MED ORDER — SENNOSIDES-DOCUSATE SODIUM 8.6-50 MG PO TABS
1.0000 | ORAL_TABLET | Freq: Every evening | ORAL | Status: DC | PRN
  Administered 2021-10-30: 1 via ORAL
  Filled 2021-10-29: qty 1

## 2021-10-29 MED ORDER — ACETAMINOPHEN 325 MG PO TABS: 650.0000 mg | ORAL_TABLET | ORAL | Status: DC | PRN

## 2021-10-29 MED ORDER — ENOXAPARIN SODIUM 40 MG/0.4ML IJ SOSY
40.0000 mg | PREFILLED_SYRINGE | INTRAMUSCULAR | Status: DC
  Administered 2021-10-29: 40 mg via SUBCUTANEOUS
  Filled 2021-10-29: qty 0.4

## 2021-10-29 MED ORDER — VENLAFAXINE HCL ER 75 MG PO CP24
75.0000 mg | ORAL_CAPSULE | Freq: Every day | ORAL | Status: DC
  Administered 2021-10-29 – 2021-10-30 (×2): 75 mg via ORAL
  Filled 2021-10-29 (×2): qty 1

## 2021-10-29 NOTE — ED Notes (Signed)
Rounded on patient, resting in bed with husband at bedside. Monitors in place. All needs met at [facility] stime.

## 2021-10-29 NOTE — Progress Notes (Signed)
OT Cancellation Note  Patient Details Name: Madison Cuevas MRN: [id number] DOB: Jun 05, 1945   Cancelled Treatment:    Reason Eval/Treat Not Completed: OT screened, no needs identified, will sign off. Consult received, chart reviewed. Upon assessment, pt endorses feeling back to baseline independence. Pt reports she has a history of vertigo and then took medication to address it which makes her feel "loopy." Since then she has felt back to her normal self. No deficits in unilateral strength, coordination, balance, sensation, cognition, vision, or speech appreciated with screen. Pt indep with ambulation in the room, toileting, and clothing mgt. No skilled OT needs identified. Will sign off.   Ardeth Perfect., MPH, MS, OTR/L ascom [phone] 10/29/21, 9:52 AM

## 2021-10-29 NOTE — ED Notes (Signed)
Pt to ct 

## 2021-10-29 NOTE — Assessment & Plan Note (Signed)
-   follow up A1c - continue SSI and CBGs

## 2021-10-29 NOTE — ED Notes (Signed)
Pt walking in hallway with PT

## 2021-10-29 NOTE — Progress Notes (Signed)
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     Assessment & Plan:   Principal Problem:   TIA (transient ischemic attack) Active Problems:   Dysarthria   History of DVT (deep vein thrombosis)   DMII (diabetes mellitus, type 2) (HCC)   OAB (overactive bladder)  1-Encephalopathy; metabolic, related to meclizine per neurology MRI negative for stroke.  TIA ruled out.  No need for EEG per neurology   2-Dizziness;  Check orthostatic.  IV fluids.  Check TSH, cortisol level.  ECHO pending Troponin negative  3-History of DVT; completed treatment in January per patient.     OAB (overactive bladder) - On Detrol at home.  Substituted for [facility] inpatient   DMII (diabetes mellitus, type 2) ([place]) - follow up A1c - continue SSI and CBGs          Estimated body mass index is 30.21 kg/m as calculated from the following:   Height as of this encounter: 5\' 6"  (1.676 m).   Weight as of 10/17/21: 84.9 kg.   DVT prophylaxis: Lovenox Code Status: Full code Family Communication: Care discussed with patient Disposition Plan:  Status is: Observation The patient remains OBS appropriate and will d/c before 2 midnights.    Consultants:  Neurology   Procedures:  ECHO  Antimicrobials:    Subjective: He doesn't recall having speech problem. Her husband notice speech problem  Objective: Vitals:   10/29/21 0630 10/29/21 0631 10/29/21 0700 10/29/21 0730  BP: 106/71  113/70 111/65  Pulse: 93  94 96  Resp: (!) 22  (!) 22 (!) 22  Temp:  98.5 F (36.9 C)    TempSrc:  Axillary    SpO2: 93%  93% 93%   Height:       No intake or output data in the 24 hours ending 10/29/21 0827 There were no vitals filed for this visit.  Examination:  General exam: Appears calm and comfortable  Respiratory system: Clear to auscultation. Respiratory effort normal. Cardiovascular system: S1 & S2 heard, RRR. No JVD, murmurs, rubs, gallops or clicks. No pedal edema. Gastrointestinal system: Abdomen is nondistended, soft and nontender. No organomegaly or masses felt. Normal bowel sounds heard. Central nervous system: Alert and oriented. No focal neurological deficits. Extremities: Symmetric 5 x 5 power.   Data Reviewed: I have personally reviewed following labs and imaging studies  CBC: Recent Labs  Lab 10/28/21 2119  WBC 12.7*  NEUTROABS 8.6*  HGB 12.7  HCT 38.5  MCV 89.3  PLT 998   Basic Metabolic Panel: Recent Labs  Lab 10/28/21 2119  NA 137  K 3.7  CL 104  CO2 26  GLUCOSE 198*  BUN 13  CREATININE 0.82  CALCIUM 8.5*   GFR: Estimated Creatinine Clearance: 63 mL/min (by C-G formula based on SCr of 0.82 mg/dL). Liver Function Tests: Recent Labs  Lab 10/28/21 2119  AST 12*  ALT 10  ALKPHOS 40  BILITOT 0.9  PROT 6.1*  ALBUMIN 3.1*   No results for input(s): "LIPASE", "AMYLASE" in the last 168 hours. No results for input(s): "AMMONIA" in the last 168 hours. Coagulation Profile: Recent Labs  Lab 10/28/21 2119  INR 1.2   Cardiac  Enzymes: No results for input(s): "CKTOTAL", "CKMB", "CKMBINDEX", "TROPONINI" in the last 168 hours. BNP (last 3 results) No results for input(s): "PROBNP" in the last 8760 hours. HbA1C: No results for input(s): "HGBA1C" in the last 72 hours. CBG: Recent Labs  Lab 10/28/21 2049 10/29/21 0101  GLUCAP 153* 206*   Lipid Profile: No results for input(s): "CHOL", "HDL", "LDLCALC", "TRIG", "CHOLHDL", "LDLDIRECT" in the last 72 hours. Thyroid Function Tests: No results for input(s): "TSH", "T4TOTAL", "FREET4", "T3FREE", "THYROIDAB" in the last  72 hours. Anemia Panel: No results for input(s): "VITAMINB12", "FOLATE", "FERRITIN", "TIBC", "IRON", "RETICCTPCT" in the last 72 hours. Sepsis Labs: No results for input(s): "PROCALCITON", "LATICACIDVEN" in the last 168 hours.  No results found for this or any previous visit (from the past 240 hour(s)).       Radiology Studies: MR BRAIN WO CONTRAST  Result Date: 10/28/2021 CLINICAL DATA:  Initial evaluation for slurred speech, weakness. EXAM: MRI HEAD WITHOUT CONTRAST TECHNIQUE: Multiplanar, multiecho pulse sequences of the brain and surrounding structures were obtained without intravenous contrast. COMPARISON:  Prior CT from earlier the same day. FINDINGS: Brain: Age-related cerebral atrophy. Patchy T2/FLAIR hyperintensity involving the periventricular deep white matter both cerebral hemispheres as well as the pons, most consistent with chronic small vessel ischemic disease, mild in nature. Tiny remote lacunar infarcts noted at the left caudate. No evidence for acute or subacute ischemia. No areas of chronic cortical infarction. No acute or chronic intracranial blood products. No mass lesion, midline shift or mass effect. No hydrocephalus or extra-axial fluid collection. Pituitary gland and suprasellar region within normal limits. Vascular: Major intracranial vascular flow voids are maintained. Skull and upper cervical spine: Craniocervical junction normal. Bone marrow signal intensity within normal limits. No scalp soft tissue abnormality. Sinuses/Orbits: Prior bilateral ocular lens replacement. Mild mucoperiosteal thickening present about the ethmoidal air cells. Paranasal sinuses are otherwise clear. No mastoid effusion. Other: None. IMPRESSION: 1. No acute intracranial abnormality. 2. Mild age-related cerebral atrophy with chronic small vessel ischemic disease. Electronically Signed   By: Rise Mu M.D.   On: 10/28/2021 22:47   CT HEAD WO CONTRAST ( )  Result Date:  10/28/2021 CLINICAL DATA:  Acute neurological deficit with stroke suspected. Back pain, shoulder pain, and dizziness. Slurred speech. EXAM: CT HEAD WITHOUT CONTRAST TECHNIQUE: Contiguous axial images were obtained from the base of the skull through the vertex without intravenous contrast. RADIATION DOSE REDUCTION: This exam was performed according to the departmental dose-optimization program which includes automated exposure control, adjustment of the mA and/or kV according to patient size and/or use of iterative reconstruction technique. COMPARISON:  None Available. FINDINGS: Brain: Diffuse cerebral atrophy. Ventricular dilatation consistent with central atrophy. Low-attenuation changes in the deep white matter consistent with small vessel ischemia. No abnormal extra-axial fluid collections. No mass effect or midline shift. Gray-white matter junctions are distinct. Basal cisterns are not effaced. No acute intracranial hemorrhage. Vascular: Intracranial arterial calcifications. Skull: Calvarium appears intact. Sinuses/Orbits: Paranasal sinuses and mastoid air cells are clear. Other: None. IMPRESSION: No acute intracranial abnormalities. Chronic atrophy and mild small vessel ischemic changes. Electronically Signed   By: Burman Nieves M.D.   On: 10/28/2021 21:08        Scheduled Meds:  [START ON 10/30/2021]  stroke: early stages of recovery book   Does not apply Once   enoxaparin (LOVENOX) injection  40 mg Subcutaneous Q24H   fesoterodine  4 mg Oral Daily   insulin aspart  0-15 Units Subcutaneous TID WC   insulin aspart  0-5 Units Subcutaneous QHS   venlafaxine XR  75 mg Oral Daily   Continuous Infusions:   LOS: 0 days    Time spent: 35 minutes.     Elmarie Shiley, MD [facility]   If 7PM-7AM, please contact night-coverage [email or web address]  10/29/2021, 8:27 AM

## 2021-10-29 NOTE — Evaluation (Signed)
Physical Therapy Evaluation Patient Details Name: Madison Cuevas MRN: [id number] DOB: February 02, 1945 Today's Date: 10/29/2021  History of Present Illness  Pt is a 76 y/o F admitted on 10/28/21 after presenting with c/o dysarthria. Head CT & MRI negative. PMH: PE/DVT (06/2020), OAB, DM2, CAD, HTN, HLD, GERD, vertigo  Clinical Impression  Pt seen for PT evaluation with pt agreeable to tx. Pt reports prior to admission she was independent without AD, driving, & denies falls. On this date, pt is able to complete supine>sit with mod I & STS transfers with independence. Pt ambulates in hallway without AD & independence with gait pattern as noted below. Pt without overt LOB during mobility & denies numbness/tingling in extremities, reports she feels she's at her baseline level of function. Pt does not demonstrate any acute PT needs at this time. PT to complete orders, please re-consult if new needs arise.       Recommendations for follow up therapy are one component of a multi-disciplinary discharge planning process, led by the attending physician.  Recommendations may be updated based on patient status, additional functional criteria and insurance authorization.  Follow Up Recommendations No PT follow up      Assistance Recommended at Discharge PRN  Patient can return home with the following       Equipment Recommendations None recommended by PT  Recommendations for Other Services       Functional Status Assessment Patient has not had a recent decline in their functional status     Precautions / Restrictions Precautions Precautions: None Restrictions Weight Bearing Restrictions: No      Mobility  Bed Mobility Overal bed mobility: Modified Independent             General bed mobility comments: supine>sit with HOB elevated    Transfers Overall transfer level: Independent Equipment used: None               General transfer comment: STS from EOB x 2 without AD     Ambulation/Gait Ambulation/Gait assistance: Independent Gait Distance (Feet): 150 Feet Assistive device: None Gait Pattern/deviations: Decreased dorsiflexion - right, Decreased dorsiflexion - left, Decreased stride length Gait velocity: slightly decreased     General Gait Details: Decreased R hip/knee flexion during swing phase. 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 Pt engages in stop/go & speed accelerations, as well as head turns during gait without overt LOB.  Stairs            Wheelchair Mobility    Modified Rankin (Stroke Patients Only)       Balance   Sitting-balance support: Feet supported Sitting balance-Leahy Scale: Normal     Standing balance support: During functional activity, No upper extremity supported Standing balance-Leahy Scale: Good                               Pertinent Vitals/Pain Pain Assessment Pain Assessment: No/denies pain    Home Living Family/patient expects to be discharged to:: Private residence Living Arrangements: Spouse/significant other Available Help at Discharge: Family Type of Home: House Home Access: Stairs to enter   Technical brewer of Steps: 1   Home Layout: One level Home Equipment: None      Prior Function Prior Level of Function : Independent/Modified Independent;Driving             Mobility Comments: Pt reports independent without AD, driving, denies falls.  Hand Dominance        Extremity/Trunk Assessment   Upper Extremity Assessment Upper Extremity Assessment: Overall WFL for tasks assessed (pt denies numbness/tingling)    Lower Extremity Assessment Lower Extremity Assessment: Overall WFL for tasks assessed (pt denies numbness/tingling)    Cervical / Trunk Assessment Cervical / Trunk Assessment: Kyphotic (rigid neck with decresed cervical flexion/extension)  Communication   Communication: No difficulties  Cognition Arousal/Alertness:  Awake/alert Behavior During Therapy: WFL for tasks assessed/performed Overall Cognitive Status: Within Functional Limits for tasks assessed                                          General Comments General comments (skin integrity, edema, etc.): max HR 131 bpm during gait    Exercises     Assessment/Plan    PT Assessment Patient does not need any further PT services  PT Problem List         PT Treatment Interventions      PT Goals (Current goals can be found in the Care Plan section)  Acute Rehab PT Goals Patient Stated Goal: go home PT Goal Formulation: With patient Time For Goal Achievement: 11/12/21 Potential to Achieve Goals: Good    Frequency       Co-evaluation               AM-PAC PT "6 Clicks" Mobility  Outcome Measure Help needed turning from your back to your side while in a flat bed without using bedrails?: None Help needed moving from lying on your back to sitting on the side of a flat bed without using bedrails?: None Help needed moving to and from a bed to a chair (including a wheelchair)?: None Help needed standing up from a chair using your arms (e.g., wheelchair or bedside chair)?: None Help needed to walk in hospital room?: None Help needed climbing 3-5 steps with a railing? : None 6 Click Score: 24    End of Session   Activity Tolerance: Patient tolerated treatment well Patient left: in chair;with call bell/phone within reach Nurse Communication: Mobility status      Time: [id number] PT Time Calculation (min) (ACUTE ONLY): 10 min   Charges:   PT Evaluation $PT Eval Low Complexity: 1 Low          Madison Cuevas, PT, DPT 10/29/21, 9:29 AM   Waunita Schooner 10/29/2021, 9:27 AM

## 2021-10-29 NOTE — Hospital Course (Addendum)
Madison Cuevas is a 75 yo female with PMH PE/DVT (June 2022), OAB, DMII, CAD, HTN, HLD, GERD, vertigo who presented with multiple symptoms per her husband. This included "vacant look" on her face with associated upset stomach, left shoulder and back pain, and then development of dysarthria. Symptoms onset around 3pm and the dysarthria started around 6 PM.  After 1 hour, symptoms had all resolved per her husband.  He did also notice some difficulty with her gait.  They denied similar symptoms of this in the past. CT head showed no acute abnormalities and showed underlying chronic atrophy with mild small vessel ischemic changes.  MRI brain was then obtained which again showed no acute abnormalities with underlying mild age-related cerebral atrophy with chronic small vessel disease. She was admitted for further TIA work-up.

## 2021-10-29 NOTE — Progress Notes (Signed)
SLP Cancellation Note  Patient Details Name: Madison Cuevas MRN: [id number] DOB: Sep 23, 1945   Cancelled treatment:       Reason Eval/Treat Not Completed: SLP screened, no needs identified, will sign off Chart review and cognitive communication screen completed. Pt demonstrating intact attention, insight, language, and safety reasoning. Speech 100% intelligible w/o distortions. Pt endorses that she is currently at her baseline. Education provided for continued monitoring for variance from baseline in the future. No further acute SLP services indicated. Please re consult if indicated.   Madison Tarika Mckethan Clapp MS [facility] SLP  Madison Cuevas 10/29/2021, 9:33 AM

## 2021-10-29 NOTE — Assessment & Plan Note (Addendum)
-   Per husband patient developed slurred speech around 6 PM which resolved after approximately 1 hour.  No prior episodes of similar in the past.  They also described multiple other symptoms as noted in HPI -Negative CT head and MRI brain for acute stroke - check CTA head/neck and echo - follow up lipid panel; hold zocor in case of need to change to high intensity  - PT/OT evals - will need pharmacy assistance in am to clarify if patient on eliquis and/or asa already, then discussion with neurology for rec's regarding regimen changes if any

## 2021-10-29 NOTE — Consult Note (Signed)
Neurology Consultation Reason for Consult: c/f TIA  Requesting Physician: Niel Hummer   CC: Meclazine side effects  History is obtained from: Patient and husband at bedside as well as chart review  HPI: Madison Cuevas is a 76 y.o. female with a past medical history significant for PE/DVT (June 2022), diabetes, hypertension, hyperlipidemia, coronary artery disease, overactive bladder.  She reports that she intermittently has dizzy spells, typically mild and always triggered by standing up.  She has been diagnosed with BPPV but notes she does not have symptoms just laying down and turning over in bed, and her symptoms are not triggered or ultimately relieved by home Epley/Hallpike maneuver.  Due to her dizzy spells she took meclizine 25 mg which she has only taken once before, and she again had the same symptoms that she had last time she took it (ambulation issues, upset stomach, altered mental status, dysarthria).  Her symptoms gradually resolved as she feels she metabolized the medication.  LKW:  Thrombolytic given?: No, out of the window at time of ED arrival IA performed?: No, symptoms resolved by the time of ED provider evaluation Premorbid modified rankin scale: 0-1  ROS: All other review of systems was negative except as noted in the HPI.   Past Medical History:  Diagnosis Date   Diabetes mellitus without complication ([place])    DVT (deep venous thrombosis) ([place]) 07/2020   GERD (gastroesophageal reflux disease)    Herpes zoster    History of chicken pox    Hyperlipidemia    Hypertension    pt denies.  meds are for kidney protection   Osteoarthrosis    PE (pulmonary thromboembolism) ([place]) 07/2020   Pneumonia    Sepsis due to Gram negative bacteria ([place]) 06/2020   Pasturella Bacteremia   Vertigo    Wears dentures    lower partial   Past Surgical History:  Procedure Laterality Date   APPENDECTOMY     BACK SURGERY     CATARACT EXTRACTION W/PHACO Right 09/09/2018    Procedure: CATARACT EXTRACTION PHACO AND INTRAOCULAR LENS PLACEMENT (IOC)  RIGHT DIABETIC  01:01.5  16.2%  9.99;  Surgeon: Leandrew Koyanagi, MD;  Location: [place];  Service: Ophthalmology;  Laterality: Right;  Diabetic - oral meds   CATARACT EXTRACTION W/PHACO Left 10/17/2021   Procedure: CATARACT EXTRACTION PHACO AND INTRAOCULAR LENS PLACEMENT ([place]) LEFT DIABETIC;  Surgeon: Leandrew Koyanagi, MD;  Location: [place];  Service: Ophthalmology;  Laterality: Left;  Diabetic 11.78 01:11.2   COLONOSCOPY WITH PROPOFOL N/A 08/21/2015   Procedure: COLONOSCOPY WITH PROPOFOL;  Surgeon: Lollie Sails, MD;  Location[facility]COPY;  Service: Endoscopy;  Laterality: N/A;   ESOPHAGOGASTRODUODENOSCOPY (EGD) WITH PROPOFOL N/A 06/21/2020   Procedure: ESOPHAGOGASTRODUODENOSCOPY (EGD) WITH PROPOFOL;  Surgeon: Toledo, Benay Pike, MD;  Location: [facility] ENDOSCOPY;  Service: Gastroenterology;  Laterality: N/A;   JOINT REPLACEMENT Right    knee   TEE WITHOUT CARDIOVERSION N/A 06/29/2020   Procedure: TRANSESOPHAGEAL ECHOCARDIOGRAM (TEE);  Surgeon: Corey Skains, MD;  Location: [facility] ORS;  Service: Cardiovascular;  Laterality: N/A;   TONSILLECTOMY     TUBAL LIGATION       Family History  Problem Relation Age of Onset   Breast cancer Cousin    Social History:  reports that she quit smoking about 24 years ago. Her smoking use included cigarettes. She has a 20.00 pack-year smoking history. She has never used smokeless tobacco. She reports that she does not currently use alcohol. She reports that she does not use  drugs.   Exam: Current vital signs: BP 111/65   Pulse 96   Temp 98.5 F (36.9 C) (Axillary)   Resp (!) 22   Ht 5\' 6"  (1.676 m)   SpO2 93%   BMI 30.21 kg/m  Vital signs in last 24 hours: Temp:  [97.7 F (36.5 C)-99.3 F (37.4 C)] 98.5 F (36.9 C) (10/23 0631) Pulse Rate:  [39-122] 96 (10/23 0730) Resp:  [16-28] 22 (10/23 0730) BP: (106-127)/(52-102) 111/65 (10/23  0730) SpO2:  [92 %-95 %] 93 % (10/23 0730)   Physical Exam  Constitutional: Appears well-developed and well-nourished.  Psych: Affect appropriate to situation, pleasant, calm and cooperative Eyes: No scleral injection HENT: No oropharyngeal obstruction.  MSK: no joint deformities.  Cardiovascular: Perfusing extremities well Respiratory: Effort normal, non-labored breathing GI: Soft.  No distension. There is no tenderness.  Skin: Warm dry and intact visible skin  Neuro: Mental Status: Patient is awake, alert, oriented to person, place, month, age, and situation. Patient is able to give a clear and coherent history. No signs of aphasia or neglect Cranial Nerves: II: Visual Fields are full. Pupils are equal, round, and reactive to light.   III,IV, VI: EOMI with chronic right eye ptosis, no diplopia, mildly saccadic pursuits V: Facial sensation is symmetric to temperature VII: Facial movement is symmetric VIII: hearing is intact to voice X: Uvula elevates symmetrically XI: Shoulder shrug is symmetric. XII: tongue is midline without atrophy or fasciculations.  Motor: Tone is normal. Bulk is normal. 5/5 strength was present in all four extremities.  Symmetric mild pronation of bilateral upper extremities without drift Sensory: Sensation is symmetric to light touch and temperature in the arms and legs.  She reports no length dependent loss of temperature sensation but she does report absent vibration at the bilateral great toes which she reports is secondary to her known neuropathy Deep Tendon Reflexes: 2+ in the right patellar, but unable to elicit left patellar. Cerebellar: FNF and HKS are intact bilaterally Gait:  Deferred   NIHSS total 0 Performed at   I have reviewed labs in epic and the results pertinent to this consultation are:  Basic Metabolic Panel: Recent Labs  Lab 10/28/21 2119  NA 137  K 3.7  CL 104  CO2 26  GLUCOSE 198*  BUN 13  CREATININE 0.82  CALCIUM  8.5*    CBC: Recent Labs  Lab 10/28/21 2119  WBC 12.7*  NEUTROABS 8.6*  HGB 12.7  HCT 38.5  MCV 89.3  PLT 222    Coagulation Studies: Recent Labs    10/28/21 2119  LABPROT 14.7  INR 1.2    Lab Results  Component Value Date   CHOL 91 10/28/2021   HDL 39 (L) 10/28/2021   LDLCALC 34 10/28/2021   TRIG 88 10/28/2021   CHOLHDL 2.3 10/28/2021   Lab Results  Component Value Date   HGBA1C 7.9 (H) 06/27/2020     I have reviewed the images obtained:  MRI brain personally reviewed, agree with radiology:  1. No acute intracranial abnormality. 2. Mild age-related cerebral atrophy with chronic small vessel ischemic disease.  CTA personally reviewed, agree with radiology:   1. No intracranial large vessel occlusion or proximal stenosis. 2. Aortic atherosclerosis. 3. Atherosclerotic change at both carotid bifurcations but no stenosis.  TEE June 2022  1. Left ventricular ejection fraction, by estimation, is 60 to 65%. The left ventricle has normal function. The left ventricle has no regional  wall motion abnormalities.   2. Right ventricular systolic  function is normal. The right ventricular  size is normal.   3. No left atrial/left atrial appendage thrombus was detected.   4. The mitral valve is normal in structure. Mild mitral valve regurgitation.   5. The aortic valve is normal in structure. Aortic valve regurgitation is  trivial.   6. 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   7. Agitated saline contrast bubble study was negative, with no evidence of any interatrial shunt.   Impression: Encephalopathy secondary to meclizine use, now resolved.  Chronic intermittent dizziness sounds more like orthostasis than BPPV based on history provided to me by patient  Recommendations: -EEG not indicated, canceling the study -Abdominal binder, TED stockings and other orthostatic precautions -Echocardiogram pending, reasonable to obtain for  dizziness work-up -Neurology will be available as needed going forward, please reach out if any new questions or concerns arise  Lesleigh Noe MD-PhD [facility] [phone] Available 7 AM to 7 PM, outside these hours please contact Neurologist on call listed on [place] coverage for [facility] is from 8 AM to 4 AM in-house and 4 PM to 8 PM by telephone/video. 8 PM to 8 AM emergent questions or overnight urgent questions should be addressed to Teleneurology On-call or [facility] neurohospitalist; contact information can be found on AMION  Greater than 80 minutes were spent in care of this patient, the majority in direct patient care

## 2021-10-29 NOTE — Assessment & Plan Note (Signed)
-   Patient has history of DVT and PE in June 2022.  Was treated with Eliquis at that time.  Husband states still on Eliquis but not listed on current med rec -Need to clarify with pharmacy in the morning if Eliquis being filled

## 2021-10-29 NOTE — ED Notes (Signed)
Changed bed sheets - assisted patient to bedside commode. Standby assistance provided.

## 2021-10-29 NOTE — Assessment & Plan Note (Signed)
-   On Detrol at home.  Substituted for [facility] inpatient

## 2021-10-29 NOTE — H&P (Signed)
History and Physical    Madison Cuevas  GEX:[id number]  DOB: [birth year]  DOA: 10/28/2021  PCP: Derinda Late, MD Patient coming from: home  Chief Complaint: slurred speech  HPI:  Madison Cuevas is a 76 yo female with PMH PE/DVT (June 2022), OAB, DMII, CAD, HTN, HLD, GERD, vertigo who presented with multiple symptoms per her husband. This included "vacant look" on her face with associated upset stomach, left shoulder and back pain, and then development of dysarthria. Symptoms onset around 3pm and the dysarthria started around 6 PM.  After 1 hour, symptoms had all resolved per her husband.  He did also notice some difficulty with her gait.  They denied similar symptoms of this in the past. CT head showed no acute abnormalities and showed underlying chronic atrophy with mild small vessel ischemic changes.  MRI brain was then obtained which again showed no acute abnormalities with underlying mild age-related cerebral atrophy with chronic small vessel disease. She was admitted for further TIA work-up.  I have personally briefly reviewed patient's old medical records in [facility] and discussed patient with the ER provider when appropriate/indicated.  Assessment and Plan: Dysarthria - Per husband patient developed slurred speech around 6 PM which resolved after approximately 1 hour.  No prior episodes of similar in the past.  They also described multiple other symptoms as noted in HPI -Negative CT head and MRI brain for acute stroke - check CTA head/neck and echo - follow up lipid panel; hold zocor in case of need to change to high intensity  - PT/OT evals - will need pharmacy assistance in am to clarify if patient on eliquis and/or asa already, then discussion with neurology for rec's regarding regimen changes if any  History of DVT (deep vein thrombosis) - Patient has history of DVT and PE in June 2022.  Was treated with Eliquis at that time.  Husband states still on Eliquis but not  listed on current med rec -Need to clarify with pharmacy in the morning if Eliquis being filled  OAB (overactive bladder) - On Detrol at home.  Substituted for [facility] inpatient  DMII (diabetes mellitus, type 2) ([place]) - follow up A1c - continue SSI and CBGs   Code Status: Full   DVT Prophylaxis: Lovenox     Anticipated disposition is to: Home  History: Past Medical History:  Diagnosis Date   Diabetes mellitus without complication ([place])    DVT (deep venous thrombosis) ([facility]) 07/2020   GERD (gastroesophageal reflux disease)    Herpes zoster    History of chicken pox    Hyperlipidemia    Hypertension    pt denies.  meds are for kidney protection   Osteoarthrosis    PE (pulmonary thromboembolism) ([place]) 07/2020   Pneumonia    Sepsis due to Gram negative bacteria ([place]) 06/2020   Pasturella Bacteremia   Vertigo    Wears dentures    lower partial    Past Surgical History:  Procedure Laterality Date   APPENDECTOMY     BACK SURGERY     CATARACT EXTRACTION W/PHACO Right 09/09/2018   Procedure: CATARACT EXTRACTION PHACO AND INTRAOCULAR LENS PLACEMENT (IOC)  RIGHT DIABETIC  01:01.5  16.2%  9.99;  Surgeon: Leandrew Koyanagi, MD;  Location: [place];  Service: Ophthalmology;  Laterality: Right;  Diabetic - oral meds   CATARACT EXTRACTION W/PHACO Left 10/17/2021   Procedure: CATARACT EXTRACTION PHACO AND INTRAOCULAR LENS PLACEMENT ([place]) LEFT DIABETIC;  Surgeon: Leandrew Koyanagi, MD;  Location: [place];  Service: Ophthalmology;  Laterality: Left;  Diabetic 11.78 01:11.2   COLONOSCOPY WITH PROPOFOL N/A 08/21/2015   Procedure: COLONOSCOPY WITH PROPOFOL;  Surgeon: Christena Deem, MD;  Location: [facility] ENDOSCOPY;  Service: Endoscopy;  Laterality: N/A;   ESOPHAGOGASTRODUODENOSCOPY (EGD) WITH PROPOFOL N/A 06/21/2020   Procedure: ESOPHAGOGASTRODUODENOSCOPY (EGD) WITH PROPOFOL;  Surgeon: Toledo, Boykin Nearing, MD;  Location: [facility] ENDOSCOPY;  Service: Gastroenterology;   Laterality: N/A;   JOINT REPLACEMENT Right    knee   TEE WITHOUT CARDIOVERSION N/A 06/29/2020   Procedure: TRANSESOPHAGEAL ECHOCARDIOGRAM (TEE);  Surgeon: Lamar Blinks, MD;  Location: [facility] ORS;  Service: Cardiovascular;  Laterality: N/A;   TONSILLECTOMY     TUBAL LIGATION       reports that she quit smoking about 24 years ago. Her smoking use included cigarettes. She has a 20.00 pack-year smoking history. She has never used smokeless tobacco. She reports that she does not currently use alcohol. She reports that she does not use drugs.  Allergies  Allergen Reactions   Adhesive [Tape] Rash    With extended use    Family History  Problem Relation Age of Onset   Breast cancer Cousin    Home Medications: Prior to Admission medications   Medication Sig Start Date End Date Taking? Authorizing Provider  Calcium-Vitamin D (CALTRATE 600 PLUS-VIT D PO) Take 1 tablet by mouth 2 (two) times daily.   Yes [provider]  Dulaglutide (TRULICITY) 3 MG/0.5ML SOPN Inject into the skin once a week. Tuesday   Yes [provider]  glipiZIDE (GLUCOTROL XL) 10 MG 24 hr tablet Take 10 mg by mouth daily. 08/23/21  Yes [provider]  losartan (COZAAR) 50 MG tablet Take 50 mg by mouth daily.   Yes [provider]  metFORMIN (GLUCOPHAGE-XR) 500 MG 24 hr tablet Take 500 mg by mouth 2 (two) times daily. 10/19/21  Yes [provider]  MULTIPLE VITAMIN PO Take 1 tablet by mouth daily.   Yes [provider]  pantoprazole (PROTONIX) 40 MG tablet Take 1 tablet (40 mg total) by mouth daily. 05/23/20 10/28/21 Yes Minna Antis, MD  progesterone (PROMETRIUM) 100 MG capsule Take 100 mg by mouth daily. 09/04/21  Yes [provider]  simvastatin (ZOCOR) 40 MG tablet Take 40 mg by mouth at bedtime. 10/05/21  Yes [provider]  tolterodine (DETROL) 2 MG tablet Take 2 mg by mouth 2 (two) times daily. 10/05/21  Yes [provider]   venlafaxine XR (EFFEXOR-XR) 75 MG 24 hr capsule Take 75 mg by mouth daily. 10/05/21  Yes [provider]  albuterol (VENTOLIN HFA) 108 (90 Base) MCG/ACT inhaler Inhale 1-2 puffs into the lungs every 6 (six) hours as needed for wheezing or shortness of breath.    [provider]  MUPIROCIN EX Apply topically. Patient not taking: Reported on 10/28/2021    [provider]    Review of Systems:  Review of Systems  Constitutional: Negative.   HENT: Negative.    Eyes: Negative.   Respiratory: Negative.    Cardiovascular: Negative.   Gastrointestinal:  Positive for nausea.  Genitourinary: Negative.   Musculoskeletal:  Positive for back pain and joint pain.  Skin: Negative.   Neurological:  Positive for speech change.  Endo/Heme/Allergies: Negative.   Psychiatric/Behavioral: Negative.      Physical Exam:  Vitals:   10/28/21 2059 10/28/21 2108 10/28/21 2300 10/28/21 2355  BP:  (!) 113/52 127/80 (!) 123/102  Pulse:  (!) 117 (!) 108 (!) 122  Resp:  Marland Kitchen)  23 (!) 28 20  Temp:      TempSrc:      SpO2:  92% 94% 94%  Height: 5\' 6"  (1.676 m)      Physical Exam Constitutional:      General: She is not in acute distress.    Appearance: Normal appearance.  HENT:     Head: Normocephalic and atraumatic.     Mouth/Throat:     Mouth: Mucous membranes are moist.  Eyes:     Extraocular Movements: Extraocular movements intact.  Cardiovascular:     Rate and Rhythm: Normal rate and regular rhythm.  Pulmonary:     Effort: Pulmonary effort is normal. No respiratory distress.     Breath sounds: Normal breath sounds. No wheezing.  Abdominal:     General: Bowel sounds are normal. There is no distension.     Palpations: Abdomen is soft.     Tenderness: There is no abdominal tenderness.  Musculoskeletal:        General: Normal range of motion.     Cervical back: Normal range of motion and neck supple.  Skin:    General: Skin is warm and dry.  Neurological:     Mental  Status: She is alert.     Comments: 4/5 strength in LLE (baseline per patient however), no other deficits appreciated   Psychiatric:        Mood and Affect: Mood normal.      Labs on Admission:  I have personally reviewed following labs and imaging studies Results for orders placed or performed during the hospital encounter of 10/28/21 (from the past 24 hour(s))  CBG monitoring, ED     Status: Abnormal   Collection Time: 10/28/21  8:49 PM  Result Value Ref Range   Glucose-Capillary 153 (H) 70 - 99 mg/dL  Protime-INR     Status: None   Collection Time: 10/28/21  9:19 PM  Result Value Ref Range   Prothrombin Time 14.7 11.4 - 15.2 seconds   INR 1.2 0.8 - 1.2  APTT     Status: None   Collection Time: 10/28/21  9:19 PM  Result Value Ref Range   aPTT 27 24 - 36 seconds  CBC     Status: Abnormal   Collection Time: 10/28/21  9:19 PM  Result Value Ref Range   WBC 12.7 (H) 4.0 - 10.5 K/uL   RBC 4.31 3.87 - 5.11 MIL/uL   Hemoglobin 12.7 12.0 - 15.0 g/dL   HCT 10/30/21 33.2 - 95.1 %   MCV 89.3 80.0 - 100.0 fL   MCH 29.5 26.0 - 34.0 pg   MCHC 33.0 30.0 - 36.0 g/dL   RDW 88.4 16.6 - 06.3 %   Platelets 222 150 - 400 K/uL   nRBC 0.0 0.0 - 0.2 %  Differential     Status: Abnormal   Collection Time: 10/28/21  9:19 PM  Result Value Ref Range   Neutrophils Relative % 68 %   Neutro Abs 8.6 (H) 1.7 - 7.7 K/uL   Lymphocytes Relative 18 %   Lymphs Abs 2.3 0.7 - 4.0 K/uL   Monocytes Relative 7 %   Monocytes Absolute 0.9 0.1 - 1.0 K/uL   Eosinophils Relative 5 %   Eosinophils Absolute 0.7 (H) 0.0 - 0.5 K/uL   Basophils Relative 1 %   Basophils Absolute 0.1 0.0 - 0.1 K/uL   Immature Granulocytes 1 %   Abs Immature Granulocytes 0.10 (H) 0.00 - 0.07 K/uL  Comprehensive metabolic panel  Status: Abnormal   Collection Time: 10/28/21  9:19 PM  Result Value Ref Range   Sodium 137 135 - 145 mmol/L   Potassium 3.7 3.5 - 5.1 mmol/L   Chloride 104 98 - 111 mmol/L   CO2 26 22 - 32 mmol/L   Glucose,  Bld 198 (H) 70 - 99 mg/dL   BUN 13 8 - 23 mg/dL   Creatinine, Ser 1.61 0.44 - 1.00 mg/dL   Calcium 8.5 (L) 8.9 - 10.3 mg/dL   Total Protein 6.1 (L) 6.5 - 8.1 g/dL   Albumin 3.1 (L) 3.5 - 5.0 g/dL   AST 12 (L) 15 - 41 U/L   ALT 10 0 - 44 U/L   Alkaline Phosphatase 40 38 - 126 U/L   Total Bilirubin 0.9 0.3 - 1.2 mg/dL   GFR, Estimated >09 >60 mL/min   Anion gap 7 5 - 15  Ethanol     Status: None   Collection Time: 10/28/21  9:19 PM  Result Value Ref Range   Alcohol, Ethyl (B) <10 <10 mg/dL  Troponin I (High Sensitivity)     Status: None   Collection Time: 10/28/21  9:19 PM  Result Value Ref Range   Troponin I (High Sensitivity) 16 <18 ng/L  Troponin I (High Sensitivity)     Status: None   Collection Time: 10/28/21 11:19 PM  Result Value Ref Range   Troponin I (High Sensitivity) 16 <18 ng/L     Radiological Exams on Admission: MR BRAIN WO CONTRAST  Result Date: 10/28/2021 CLINICAL DATA:  Initial evaluation for slurred speech, weakness. EXAM: MRI HEAD WITHOUT CONTRAST TECHNIQUE: Multiplanar, multiecho pulse sequences of the brain and surrounding structures were obtained without intravenous contrast. COMPARISON:  Prior CT from earlier the same day. FINDINGS: Brain: Age-related cerebral atrophy. Patchy T2/FLAIR hyperintensity involving the periventricular deep white matter both cerebral hemispheres as well as the pons, most consistent with chronic small vessel ischemic disease, mild in nature. Tiny remote lacunar infarcts noted at the left caudate. No evidence for acute or subacute ischemia. No areas of chronic cortical infarction. No acute or chronic intracranial blood products. No mass lesion, midline shift or mass effect. No hydrocephalus or extra-axial fluid collection. Pituitary gland and suprasellar region within normal limits. Vascular: Major intracranial vascular flow voids are maintained. Skull and upper cervical spine: Craniocervical junction normal. Bone marrow signal intensity  within normal limits. No scalp soft tissue abnormality. Sinuses/Orbits: Prior bilateral ocular lens replacement. Mild mucoperiosteal thickening present about the ethmoidal air cells. Paranasal sinuses are otherwise clear. No mastoid effusion. Other: None. IMPRESSION: 1. No acute intracranial abnormality. 2. Mild age-related cerebral atrophy with chronic small vessel ischemic disease. Electronically Signed   By: Rise Mu M.D.   On: 10/28/2021 22:47   CT HEAD WO CONTRAST ( )  Result Date: 10/28/2021 CLINICAL DATA:  Acute neurological deficit with stroke suspected. Back pain, shoulder pain, and dizziness. Slurred speech. EXAM: CT HEAD WITHOUT CONTRAST TECHNIQUE: Contiguous axial images were obtained from the base of the skull through the vertex without intravenous contrast. RADIATION DOSE REDUCTION: This exam was performed according to the departmental dose-optimization program which includes automated exposure control, adjustment of the mA and/or kV according to patient size and/or use of iterative reconstruction technique. COMPARISON:  None Available. FINDINGS: Brain: Diffuse cerebral atrophy. Ventricular dilatation consistent with central atrophy. Low-attenuation changes in the deep white matter consistent with small vessel ischemia. No abnormal extra-axial fluid collections. No mass effect or midline shift. Gray-white matter junctions are distinct. Basal  cisterns are not effaced. No acute intracranial hemorrhage. Vascular: Intracranial arterial calcifications. Skull: Calvarium appears intact. Sinuses/Orbits: Paranasal sinuses and mastoid air cells are clear. Other: None. IMPRESSION: No acute intracranial abnormalities. Chronic atrophy and mild small vessel ischemic changes. Electronically Signed   By: Burman NievesWilliam  Stevens M.D.   On: 10/28/2021 21:08   MR BRAIN WO CONTRAST  Final Result    CT HEAD WO CONTRAST (5MM)  Final Result      Consults called:  N/a   EKG: Independently  reviewed. Sinus tach with PVCs   Lewie Chamberavid Amayrany Cafaro, MD [facility] 10/29/2021, 12:43 AM

## 2021-10-30 DIAGNOSIS — R471 Dysarthria and anarthria: Secondary | ICD-10-CM | POA: Diagnosis not present

## 2021-10-30 DIAGNOSIS — G9341 Metabolic encephalopathy: Secondary | ICD-10-CM | POA: Diagnosis not present

## 2021-10-30 LAB — CORTISOL-AM, BLOOD: Cortisol - AM: 2.5 ug/dL — ABNORMAL LOW (ref 6.7–22.6)

## 2021-10-30 LAB — GLUCOSE, CAPILLARY
Glucose-Capillary: 155 mg/dL — ABNORMAL HIGH (ref 70–99)
Glucose-Capillary: 152 mg/dL — ABNORMAL HIGH (ref 70–99)

## 2021-10-30 NOTE — Plan of Care (Signed)
Problem: Education: Goal: Knowledge of disease or condition will improve Outcome: Adequate for Discharge Goal: Knowledge of secondary prevention will improve (MUST DOCUMENT ALL) Outcome: Adequate for Discharge Goal: Knowledge of patient specific risk factors will improve [place] N/A or DELETE if not current risk factor) Outcome: Adequate for Discharge   Problem: Ischemic Stroke/TIA Tissue Perfusion: Goal: Complications of ischemic stroke/TIA will be minimized Outcome: Adequate for Discharge   Problem: Coping: Goal: Will verbalize positive feelings about self Outcome: Adequate for Discharge Goal: Will identify appropriate support needs Outcome: Adequate for Discharge   Problem: Health Behavior/Discharge Planning: Goal: Ability to manage health-related needs will improve Outcome: Adequate for Discharge Goal: Goals will be collaboratively established with patient/family Outcome: Adequate for Discharge   Problem: Self-Care: Goal: Ability to participate in self-care as condition permits will improve Outcome: Adequate for Discharge Goal: Verbalization of feelings and concerns over difficulty with self-care will improve Outcome: Adequate for Discharge Goal: Ability to communicate needs accurately will improve Outcome: Adequate for Discharge   Problem: Nutrition: Goal: Risk of aspiration will decrease Outcome: Adequate for Discharge Goal: Dietary intake will improve Outcome: Adequate for Discharge   Problem: Education: Goal: Ability to describe self-care measures that may prevent or decrease complications (Diabetes Survival Skills Education) will improve Outcome: Adequate for Discharge Goal: Individualized Educational Video(s) Outcome: Adequate for Discharge   Problem: Coping: Goal: Ability to adjust to condition or change in health will improve Outcome: Adequate for Discharge   Problem: Fluid Volume: Goal: Ability to maintain a balanced intake and output will  improve Outcome: Adequate for Discharge   Problem: Health Behavior/Discharge Planning: Goal: Ability to identify and utilize available resources and services will improve Outcome: Adequate for Discharge Goal: Ability to manage health-related needs will improve Outcome: Adequate for Discharge   Problem: Metabolic: Goal: Ability to maintain appropriate glucose levels will improve Outcome: Adequate for Discharge   Problem: Nutritional: Goal: Maintenance of adequate nutrition will improve Outcome: Adequate for Discharge Goal: Progress toward achieving an optimal weight will improve Outcome: Adequate for Discharge   Problem: Skin Integrity: Goal: Risk for impaired skin integrity will decrease Outcome: Adequate for Discharge   Problem: Tissue Perfusion: Goal: Adequacy of tissue perfusion will improve Outcome: Adequate for Discharge   Problem: Education: Goal: Knowledge of General Education information will improve Description: Including pain rating scale, medication(s)/side effects and non-pharmacologic comfort measures Outcome: Adequate for Discharge   Problem: Health Behavior/Discharge Planning: Goal: Ability to manage health-related needs will improve Outcome: Adequate for Discharge   Problem: Clinical Measurements: Goal: Ability to maintain clinical measurements within normal limits will improve Outcome: Adequate for Discharge Goal: Will remain free from infection Outcome: Adequate for Discharge Goal: Diagnostic test results will improve Outcome: Adequate for Discharge Goal: Respiratory complications will improve Outcome: Adequate for Discharge Goal: Cardiovascular complication will be avoided Outcome: Adequate for Discharge   Problem: Activity: Goal: Risk for activity intolerance will decrease Outcome: Adequate for Discharge   Problem: Nutrition: Goal: Adequate nutrition will be maintained Outcome: Adequate for Discharge   Problem: Coping: Goal: Level of  anxiety will decrease Outcome: Adequate for Discharge   Problem: Elimination: Goal: Will not experience complications related to bowel motility Outcome: Adequate for Discharge Goal: Will not experience complications related to urinary retention Outcome: Adequate for Discharge   Problem: Education: Goal: Knowledge of disease or condition will improve Outcome: Adequate for Discharge Goal: Knowledge of secondary prevention will improve (MUST DOCUMENT ALL) Outcome: Adequate for Discharge Goal: Knowledge of patient specific risk factors will improve [place] N/A  or DELETE if not current risk factor) Outcome: Adequate for Discharge   Problem: Ischemic Stroke/TIA Tissue Perfusion: Goal: Complications of ischemic stroke/TIA will be minimized Outcome: Adequate for Discharge   Problem: Coping: Goal: Will verbalize positive feelings about self Outcome: Adequate for Discharge Goal: Will identify appropriate support needs Outcome: Adequate for Discharge   Problem: Health Behavior/Discharge Planning: Goal: Ability to manage health-related needs will improve Outcome: Adequate for Discharge Goal: Goals will be collaboratively established with patient/family Outcome: Adequate for Discharge   Problem: Self-Care: Goal: Ability to participate in self-care as condition permits will improve Outcome: Adequate for Discharge Goal: Verbalization of feelings and concerns over difficulty with self-care will improve Outcome: Adequate for Discharge Goal: Ability to communicate needs accurately will improve Outcome: Adequate for Discharge   Problem: Nutrition: Goal: Risk of aspiration will decrease Outcome: Adequate for Discharge Goal: Dietary intake will improve Outcome: Adequate for Discharge

## 2021-10-30 NOTE — Discharge Summary (Signed)
Physician Discharge Summary   Patient: Madison Cuevas MRN: [id number] DOB: February 14, 1945  Admit date:     10/28/2021  Discharge date: 10/30/21  Discharge Physician: Alba Cory   PCP: Kandyce Rud, MD   Recommendations at discharge:    Follow BP, If BP increases  and if not significant orthostatic Hypotension consider resumption of cozaar.  Cortisol came back low, This will need to be repeated and will need further evaluation. BP was normal, and orthostatic vitals normalized.   Discharge Diagnoses: Active Problems:   Dysarthria   History of DVT (deep vein thrombosis)   DMII (diabetes mellitus, type 2) (HCC)   OAB (overactive bladder)   Acute metabolic encephalopathy   Slurred speech  Resolved Problems:   * No resolved hospital problems. [facility] Course: 76 year old with past medical history significant on PE/DVT June 2022, diabetes type 2, CAD, hypertension, hyperlipidemia, GERD, vertigo who presents with multiple symptoms: "vacant look" of her face, stomach upset, stomach and back pain, dysarthria symptoms resolved on presentation to the ED.  Assessment and Plan:   1-Encephalopathy; metabolic, related to meclizine per neurology MRI negative for stroke.  TIA ruled out.  No need for EEG per neurology   2-Dizziness; Orthostatic Hypotension Orthostatic Vitals positive.  Related to recent diarrheal illness.  IV fluids.  TSH 1.1,  Cortisol level 2.5 ECHO pending Troponin negative   3-History of DVT; completed treatment in January per patient.      OAB (overactive bladder) - On Detrol at home.  Substituted for T[facility] inpatient   DMII (diabetes mellitus, type 2) (HCC) - follow up A1c - continue SSI and CBGs           Consultants: Neurology  Procedures performed: None Disposition: Home Diet recommendation:  Discharge Diet Orders (From admission, onward)     Start     Ordered   10/30/21 0000  Diet - low sodium heart healthy        10/30/21 1140            Carb modified diet DISCHARGE MEDICATION: Allergies as of 10/30/2021       Reactions   Adhesive [tape] Rash   With extended use        Medication List     STOP taking these medications    losartan 50 MG tablet Commonly known as: COZAAR       TAKE these medications    albuterol 108 (90 Base) MCG/ACT inhaler Commonly known as: VENTOLIN HFA Inhale 1-2 puffs into the lungs every 6 (six) hours as needed for wheezing or shortness of breath.   CALTRATE 600 PLUS-VIT D PO Take 1 tablet by mouth 2 (two) times daily.   glipiZIDE 10 MG 24 hr tablet Commonly known as: GLUCOTROL XL Take 10 mg by mouth daily.   metFORMIN 500 MG 24 hr tablet Commonly known as: GLUCOPHAGE-XR Take 500 mg by mouth 2 (two) times daily.   MULTIPLE VITAMIN PO Take 1 tablet by mouth daily.   MUPIROCIN EX Apply topically.   pantoprazole 40 MG tablet Commonly known as: Protonix Take 1 tablet (40 mg total) by mouth daily.   progesterone 100 MG capsule Commonly known as: PROMETRIUM Take 100 mg by mouth daily.   simvastatin 40 MG tablet Commonly known as: ZOCOR Take 40 mg by mouth at bedtime.   tolterodine 2 MG tablet Commonly known as: DETROL Take 2 mg by mouth 2 (two) times daily.   Trulicity 3 MG/0.5ML Sopn Generic drug: Dulaglutide Inject into the  skin once a week. Tuesday   venlafaxine XR 75 MG 24 hr capsule Commonly known as: EFFEXOR-XR Take 75 mg by mouth daily.        Follow-up Information     Derinda Late, MD Follow up in 1 week(s).   Specialty: Family Medicine Why: Office will call patient to make follow up appt Contact information: [address] [facility] [place] [address] [phone]                Discharge Exam: There were no vitals filed for this visit. Genera;   Condition at discharge: stable  The results of significant diagnostics from this hospitalization (including imaging,  microbiology, ancillary and laboratory) are listed below for reference.   Imaging Studies: ECHOCARDIOGRAM COMPLETE  Result Date: 10/29/2021    ECHOCARDIOGRAM REPORT   Patient Name:   Madison Cuevas Date of Exam: 10/29/2021 Medical Rec #:  [id number]      Height:       66.0 in Accession #:    [id number]     Weight:       187.2 lb Date of Birth:  [birth year]      BSA:          1.944 m Patient Age:    76 years       BP:           111/65 mmHg Patient Gender: F              HR:           102 bpm. Exam Location:  [facility] Procedure: 2D Echo, Cardiac Doppler and Color Doppler Indications:     G45.9 TIA  History:         Patient has prior history of Echocardiogram examinations, most                  recent 06/29/2020. Risk Factors:Former Smoker, Diabetes,                  Dyslipidemia and Hypertension.  Sonographer:     Rosalia Hammers Referring Phys:  [facility] Diagnosing Phys: Serafina Royals MD IMPRESSIONS  1. Left ventricular ejection fraction, by estimation, is 65 to 70%. The left ventricle has normal function. The left ventricle has no regional wall motion abnormalities. Left ventricular diastolic parameters were normal.  2. Right ventricular systolic function is normal. The right ventricular size is normal.  3. The mitral valve is normal in structure. Mild mitral valve regurgitation.  4. The aortic valve is normal in structure. Aortic valve regurgitation is trivial. FINDINGS  Left Ventricle: Left ventricular ejection fraction, by estimation, is 65 to 70%. The left ventricle has normal function. The left ventricle has no regional wall motion abnormalities. The left ventricular internal cavity size was normal in size. There is  no left ventricular hypertrophy. Left ventricular diastolic parameters were normal. Right Ventricle: The right ventricular size is normal. No increase in right ventricular wall thickness. Right ventricular systolic function is normal. Left Atrium: Left atrial size was normal in size. Right  Atrium: Right atrial size was normal in size. Pericardium: There is no evidence of pericardial effusion. Mitral Valve: The mitral valve is normal in structure. Mild mitral valve regurgitation. Tricuspid Valve: The tricuspid valve is normal in structure. Tricuspid valve regurgitation is trivial. Aortic Valve: The aortic valve is normal in structure. Aortic valve regurgitation is trivial. Pulmonic Valve: The pulmonic valve was normal in structure. Pulmonic valve  regurgitation is not visualized. Aorta: The aortic root and ascending aorta are structurally normal, with no evidence of dilitation. IAS/Shunts: No atrial level shunt detected by color flow Doppler.   LV Volumes (MOD) LV vol d, MOD A2C: 98.0 ml LV vol d, MOD A4C: 116.0 ml LV vol s, MOD A2C: 59.6 ml LV vol s, MOD A4C: 76.1 ml LV SV MOD A2C:     38.4 ml LV SV MOD A4C:     116.0 ml LV SV MOD BP:      45.8 ml Arnoldo HookerBruce Kowalski MD Electronically signed by Arnoldo HookerBruce Kowalski MD Signature Date/Time: 10/29/2021/4:54:30 PM    Final    CT ANGIO HEAD NECK W WO CM  Result Date: 10/29/2021 CLINICAL DATA:  Dysarthria. EXAM: CT ANGIOGRAPHY HEAD AND NECK TECHNIQUE: Multidetector CT imaging of the head and neck was performed using the standard protocol during bolus administration of intravenous contrast. Multiplanar CT image reconstructions and MIPs were obtained to evaluate the vascular anatomy. Carotid stenosis measurements (when applicable) are obtained utilizing NASCET criteria, using the distal internal carotid diameter as the denominator. RADIATION DOSE REDUCTION: This exam was performed according to the departmental dose-optimization program which includes automated exposure control, adjustment of the mA and/or kV according to patient size and/or use of iterative reconstruction technique. CONTRAST:  75mL OMNIPAQUE IOHEXOL 350 MG/ML SOLN COMPARISON:  CT and MRI studies done yesterday. FINDINGS: CTA NECK FINDINGS Aortic arch: Aortic atherosclerosis. Right carotid system:  Common carotid arteries show mild plaque but no stenosis. Right carotid bifurcation shows calcified plaque but no stenosis. Cervical ICA widely patent. Left carotid system: Calcified plaque at the carotid bifurcation and proximal ICA bulb but no stenosis. Cervical ICA widely patent beyond that. Vertebral arteries: Both vertebral artery origins are widely patent. Both vertebral arteries are normal through the cervical region to the foramen magnum Skeleton: Chronic cervical spondylosis. Other neck: No mass or lymphadenopathy. Upper chest: Normal Review of the MIP images confirms the above findings CTA HEAD FINDINGS Anterior circulation: Both internal carotid arteries are widely patent through the skull base and siphon regions. Anterior and middle cerebral vessels are patent. No large vessel occlusion or proximal stenosis. No aneurysm or vascular malformation. Posterior circulation: Both vertebral arteries are patent through the foramen magnum to the basilar artery. No basilar stenosis. Posterior circulation branch vessels are patent. Venous sinuses: Patent and normal. Anatomic variants: None significant. Review of the MIP images confirms the above findings IMPRESSION: 1. No intracranial large vessel occlusion or proximal stenosis. 2. Aortic atherosclerosis. 3. Atherosclerotic change at both carotid bifurcations but no stenosis. Aortic Atherosclerosis (ICD10-I70.0). Electronically Signed   By: Paulina FusiMark  Shogry M.D.   On: 10/29/2021 08:42   MR BRAIN WO CONTRAST  Result Date: 10/28/2021 CLINICAL DATA:  Initial evaluation for slurred speech, weakness. EXAM: MRI HEAD WITHOUT CONTRAST TECHNIQUE: Multiplanar, multiecho pulse sequences of the brain and surrounding structures were obtained without intravenous contrast. COMPARISON:  Prior CT from earlier the same day. FINDINGS: Brain: Age-related cerebral atrophy. Patchy T2/FLAIR hyperintensity involving the periventricular deep white matter both cerebral hemispheres as well  as the pons, most consistent with chronic small vessel ischemic disease, mild in nature. Tiny remote lacunar infarcts noted at the left caudate. No evidence for acute or subacute ischemia. No areas of chronic cortical infarction. No acute or chronic intracranial blood products. No mass lesion, midline shift or mass effect. No hydrocephalus or extra-axial fluid collection. Pituitary gland and suprasellar region within normal limits. Vascular: Major intracranial vascular flow voids are maintained. Skull  and upper cervical spine: Craniocervical junction normal. Bone marrow signal intensity within normal limits. No scalp soft tissue abnormality. Sinuses/Orbits: Prior bilateral ocular lens replacement. Mild mucoperiosteal thickening present about the ethmoidal air cells. Paranasal sinuses are otherwise clear. No mastoid effusion. Other: None. IMPRESSION: 1. No acute intracranial abnormality. 2. Mild age-related cerebral atrophy with chronic small vessel ischemic disease. Electronically Signed   By: Rise Mu M.D.   On: 10/28/2021 22:47   CT HEAD WO CONTRAST ( )  Result Date: 10/28/2021 CLINICAL DATA:  Acute neurological deficit with stroke suspected. Back pain, shoulder pain, and dizziness. Slurred speech. EXAM: CT HEAD WITHOUT CONTRAST TECHNIQUE: Contiguous axial images were obtained from the base of the skull through the vertex without intravenous contrast. RADIATION DOSE REDUCTION: This exam was performed according to the departmental dose-optimization program which includes automated exposure control, adjustment of the mA and/or kV according to patient size and/or use of iterative reconstruction technique. COMPARISON:  None Available. FINDINGS: Brain: Diffuse cerebral atrophy. Ventricular dilatation consistent with central atrophy. Low-attenuation changes in the deep white matter consistent with small vessel ischemia. No abnormal extra-axial fluid collections. No mass effect or midline shift.  Gray-white matter junctions are distinct. Basal cisterns are not effaced. No acute intracranial hemorrhage. Vascular: Intracranial arterial calcifications. Skull: Calvarium appears intact. Sinuses/Orbits: Paranasal sinuses and mastoid air cells are clear. Other: None. IMPRESSION: No acute intracranial abnormalities. Chronic atrophy and mild small vessel ischemic changes. Electronically Signed   By: Burman Nieves M.D.   On: 10/28/2021 21:08    Microbiology: Results for orders placed or performed during the hospital encounter of 06/26/20  Culture, blood (Routine x 2)     Status: Abnormal   Collection Time: 06/26/20 11:17 PM   Specimen: BLOOD  Result Value Ref Range Status   Specimen Description   Final    BLOOD RIGHT FOREARM Performed at [facility], [address] [place], [place] [address]    Special Requests   Final    BOTTLES DRAWN AEROBIC AND ANAEROBIC Blood Culture results may not be optimal due to an excessive volume of blood received in culture bottles Performed at [facility], [address], [place], [place] [address]    Culture  Setup Time   Final    GRAM NEGATIVE RODS IN BOTH AEROBIC AND ANAEROBIC BOTTLES CRITICAL VALUE NOTED.  VALUE IS CONSISTENT WITH PREVIOUSLY REPORTED AND CALLED VALUE. Performed at [facility], [address] [place], [place] [address]    Culture (A)  Final    PASTEURELLA MULTOCIDA Usually susceptible to penicillin and other beta lactam agents,quinolones,macrolides and tetracyclines. Performed at [facility], [address], [place], [place] [address]    Report Status 06/29/2020 FINAL  Final  Resp Panel by RT-PCR (Flu A&B, Covid) Nasopharyngeal Swab     Status: None   Collection Time: 06/26/20 11:18 PM   Specimen: Nasopharyngeal Swab; Nasopharyngeal(NP) swabs in vial transport medium  Result Value Ref Range Status   SARS Coronavirus 2 by RT PCR NEGATIVE NEGATIVE Final    Comment: (NOTE) SARS-CoV-2 target  nucleic acids are NOT DETECTED.  The SARS-CoV-2 RNA is generally detectable in upper respiratory specimens during the acute phase of infection. The lowest concentration of SARS-CoV-2 viral copies this assay can detect is 138 copies/mL. A negative result does not preclude SARS-Cov-2 infection and should not be used as the sole basis for treatment or other patient management decisions. A negative result may occur with  improper specimen collection/handling, submission of specimen other  than nasopharyngeal swab, presence of viral mutation(s) within the areas targeted by this assay, and inadequate number of viral copies(<138 copies/mL). A negative result must be combined with clinical observations, patient history, and epidemiological information. The expected result is Negative.  Fact Sheet for Patients:  BloggerCourse.com  Fact Sheet for Healthcare Providers:  SeriousBroker.it  This test is no t yet approved or cleared by the [place] FDA and  has been authorized for detection and/or diagnosis of SARS-CoV-2 by FDA under an Emergency Use Authorization (EUA). This EUA will remain  in effect (meaning this test can be used) for the duration of the COVID-19 declaration under Section 564(b)(1) of the Act, 21 U.S.C.section 360bbb-3(b)(1), unless the authorization is terminated  or revoked sooner.       Influenza A by PCR NEGATIVE NEGATIVE Final   Influenza B by PCR NEGATIVE NEGATIVE Final    Comment: (NOTE) The Xpert Xpress SARS-CoV-2/FLU/RSV plus assay is intended as an aid in the diagnosis of influenza from Nasopharyngeal swab specimens and should not be used as a sole basis for treatment. Nasal washings and aspirates are unacceptable for Xpert Xpress SARS-CoV-2/FLU/RSV testing.  Fact Sheet for Patients: BloggerCourse.com  Fact Sheet for Healthcare  Providers: SeriousBroker.it  This test is not yet approved or cleared by the [place] FDA and has been authorized for detection and/or diagnosis of SARS-CoV-2 by FDA under an Emergency Use Authorization (EUA). This EUA will remain in effect (meaning this test can be used) for the duration of the COVID-19 declaration under Section 564(b)(1) of the Act, 21 U.S.C. section 360bbb-3(b)(1), unless the authorization is terminated or revoked.  Performed at [facility], [address] [place], [place] [address]   Culture, blood (Routine x 2)     Status: Abnormal   Collection Time: 06/27/20 12:25 AM   Specimen: BLOOD  Result Value Ref Range Status   Specimen Description   Final    BLOOD LEFT ANTECUBITAL Performed at [facility], [address], [place], [place] [address]    Special Requests   Final    BOTTLES DRAWN AEROBIC AND ANAEROBIC Blood Culture adequate volume Performed at [facility], [address] [place], [place] [address]    Culture  Setup Time   Final    GRAM NEGATIVE RODS IN BOTH AEROBIC AND ANAEROBIC BOTTLES CRITICAL RESULT CALLED TO, READ BACK BY AND VERIFIED WITH: DEVIN MITCHELL, PHARMD AT 1134 ON 06/27/20 BY GM    Culture (A)  Final    PASTEURELLA MULTOCIDA Usually susceptible to penicillin and other beta lactam agents,quinolones,macrolides and tetracyclines. Performed at [facility], [address] [place], [place] [address]    Report Status 06/29/2020 FINAL  Final  Blood Culture ID Panel (Reflexed)     Status: None   Collection Time: 06/27/20 12:25 AM  Result Value Ref Range Status   Enterococcus faecalis NOT DETECTED NOT DETECTED Final   Enterococcus Faecium NOT DETECTED NOT DETECTED Final   Listeria monocytogenes NOT DETECTED NOT DETECTED Final   Staphylococcus species NOT DETECTED NOT DETECTED Final   Staphylococcus aureus (BCID) NOT DETECTED NOT DETECTED Final   Staphylococcus epidermidis NOT  DETECTED NOT DETECTED Final   Staphylococcus lugdunensis NOT DETECTED NOT DETECTED Final   Streptococcus species NOT DETECTED NOT DETECTED Final   Streptococcus agalactiae NOT DETECTED NOT DETECTED Final   Streptococcus pneumoniae NOT DETECTED NOT DETECTED Final   Streptococcus pyogenes NOT DETECTED NOT DETECTED Final   A.calcoaceticus-baumannii NOT DETECTED NOT DETECTED Final  Bacteroides fragilis NOT DETECTED NOT DETECTED Final   Enterobacterales NOT DETECTED NOT DETECTED Final   Enterobacter cloacae complex NOT DETECTED NOT DETECTED Final   Escherichia coli NOT DETECTED NOT DETECTED Final   Klebsiella aerogenes NOT DETECTED NOT DETECTED Final   Klebsiella oxytoca NOT DETECTED NOT DETECTED Final   Klebsiella pneumoniae NOT DETECTED NOT DETECTED Final   Proteus species NOT DETECTED NOT DETECTED Final   Salmonella species NOT DETECTED NOT DETECTED Final   Serratia marcescens NOT DETECTED NOT DETECTED Final   Haemophilus influenzae NOT DETECTED NOT DETECTED Final   Neisseria meningitidis NOT DETECTED NOT DETECTED Final   Pseudomonas aeruginosa NOT DETECTED NOT DETECTED Final   Stenotrophomonas maltophilia NOT DETECTED NOT DETECTED Final   Candida albicans NOT DETECTED NOT DETECTED Final   Candida auris NOT DETECTED NOT DETECTED Final   Candida glabrata NOT DETECTED NOT DETECTED Final   Candida krusei NOT DETECTED NOT DETECTED Final   Candida parapsilosis NOT DETECTED NOT DETECTED Final   Candida tropicalis NOT DETECTED NOT DETECTED Final   Cryptococcus neoformans/gattii NOT DETECTED NOT DETECTED Final    Comment: Performed at [facility], [address], [place], [place] [address]  Urine Culture     Status: None   Collection Time: 06/27/20  1:41 AM   Specimen: Urine, Catheterized  Result Value Ref Range Status   Specimen Description   Final    URINE, CATHETERIZED Performed at [facility], [address] [place], [place] [address]    Special Requests   Final     NONE Performed at [facility], [address], [place], [place] [address]    Culture   Final    NO GROWTH Performed at [facility], [address] [place], [place] [address]    Report Status 06/28/2020 FINAL  Final  Respiratory (~20 pathogens) panel by PCR     Status: None   Collection Time: 06/28/20  2:24 PM   Specimen: Nasopharyngeal Swab; Respiratory  Result Value Ref Range Status   Adenovirus NOT DETECTED NOT DETECTED Final   Coronavirus 229E NOT DETECTED NOT DETECTED Final    Comment: (NOTE) The Coronavirus on the Respiratory Panel, DOES NOT test for the novel  Coronavirus (2019 nCoV)    Coronavirus HKU1 NOT DETECTED NOT DETECTED Final   Coronavirus NL63 NOT DETECTED NOT DETECTED Final   Coronavirus OC43 NOT DETECTED NOT DETECTED Final   Metapneumovirus NOT DETECTED NOT DETECTED Final   Rhinovirus / Enterovirus NOT DETECTED NOT DETECTED Final   Influenza A NOT DETECTED NOT DETECTED Final   Influenza B NOT DETECTED NOT DETECTED Final   Parainfluenza Virus 1 NOT DETECTED NOT DETECTED Final   Parainfluenza Virus 2 NOT DETECTED NOT DETECTED Final   Parainfluenza Virus 3 NOT DETECTED NOT DETECTED Final   Parainfluenza Virus 4 NOT DETECTED NOT DETECTED Final   Respiratory Syncytial Virus NOT DETECTED NOT DETECTED Final   Bordetella pertussis NOT DETECTED NOT DETECTED Final   Bordetella Parapertussis NOT DETECTED NOT DETECTED Final   Chlamydophila pneumoniae NOT DETECTED NOT DETECTED Final   Mycoplasma pneumoniae NOT DETECTED NOT DETECTED Final    Comment: Performed at [facility], [address], [place], [place] [address]  CULTURE, BLOOD (ROUTINE X 2) w Reflex to ID Panel     Status: None   Collection Time: 06/29/20  2:20 AM   Specimen: BLOOD  Result Value Ref Range Status   Specimen Description BLOOD LEFT ANTECUBITAL  Final   Special Requests  Final    BOTTLES DRAWN AEROBIC AND ANAEROBIC Blood Culture adequate volume   Culture   Final    NO  GROWTH 5 DAYS Performed at [facility] [place], [place] [address]    Report Status 07/04/2020 FINAL  Final  CULTURE, BLOOD (ROUTINE X 2) w Reflex to ID Panel     Status: None   Collection Time: 06/29/20  2:21 AM   Specimen: BLOOD  Result Value Ref Range Status   Specimen Description BLOOD RIGHT ANTECUBITAL  Final   Special Requests   Final    BOTTLES DRAWN AEROBIC AND ANAEROBIC Blood Culture adequate volume   Culture   Final    NO GROWTH 5 DAYS Performed at [facility], [address] [place], [place] [address]    Report Status 07/04/2020 FINAL  Final    Labs: CBC: Recent Labs  Lab 10/28/21 2119  WBC 12.7*  NEUTROABS 8.6*  HGB 12.7  HCT 38.5  MCV 89.3  PLT 222   Basic Metabolic Panel: Recent Labs  Lab 10/28/21 2119  NA 137  K 3.7  CL 104  CO2 26  GLUCOSE 198*  BUN 13  CREATININE 0.82  CALCIUM 8.5*   Liver Function Tests: Recent Labs  Lab 10/28/21 2119  AST 12*  ALT 10  ALKPHOS 40  BILITOT 0.9  PROT 6.1*  ALBUMIN 3.1*   CBG: Recent Labs  Lab 10/29/21 1254 10/29/21 1634 10/29/21 2123 10/30/21 0812 10/30/21 1139  GLUCAP 160* 179* 180* 155* 152*    Discharge time spent: greater than 30 minutes.  Signed: Alba Cory, MD [facility] 10/30/2021

## 2021-10-30 NOTE — Progress Notes (Signed)
Mobility Specialist - Progress Note   10/30/21 1016  Mobility  Activity Ambulated independently in room  Level of Assistance Modified independent, requires aide device or extra time  Assistive Device None  Distance Ambulated (ft) 20 ft  Activity Response Tolerated well  Mobility Referral Yes  $Mobility charge 1 Mobility   Pt supine upon entry, utilizing RA. Pt STS and ambulation ModI. Pt ambulated around room without AD, tolerated well. Pt denied dizziness, fatigue and nausea. Pt left supine with needs within reach, no complaints.   Candie [place] Mobility Specialist 10/30/21 10:21 AM

## 2021-11-01 ENCOUNTER — Ambulatory Visit
Admission: RE | Admit: 2021-11-01 | Discharge: 2021-11-01 | Disposition: A | Discharge status: Home / Self Care | Payer: Medicare PPO | Source: Ambulatory Visit | Attending: Family Medicine | Admitting: Family Medicine

## 2021-11-01 DIAGNOSIS — Z1231 Encounter for screening mammogram for malignant neoplasm of breast: Secondary | ICD-10-CM | POA: Diagnosis not present

## 2022-07-07 DIAGNOSIS — H1045 Other chronic allergic conjunctivitis: Secondary | ICD-10-CM | POA: Insufficient documentation

## 2022-07-07 DIAGNOSIS — R059 Cough, unspecified: Secondary | ICD-10-CM | POA: Insufficient documentation

## 2022-07-07 DIAGNOSIS — J300 Vasomotor rhinitis: Secondary | ICD-10-CM | POA: Insufficient documentation

## 2022-11-06 ENCOUNTER — Other Ambulatory Visit: Payer: Self-pay | Admitting: Family Medicine

## 2022-11-06 DIAGNOSIS — Z1231 Encounter for screening mammogram for malignant neoplasm of breast: Secondary | ICD-10-CM

## 2022-11-08 ENCOUNTER — Ambulatory Visit
Admission: RE | Admit: 2022-11-08 | Discharge: 2022-11-08 | Disposition: A | Discharge status: Home / Self Care | Payer: Medicare PPO | Source: Ambulatory Visit | Attending: Family Medicine | Admitting: Family Medicine

## 2022-11-08 DIAGNOSIS — Z1231 Encounter for screening mammogram for malignant neoplasm of breast: Secondary | ICD-10-CM | POA: Diagnosis present

## 2022-11-18 NOTE — Discharge Instructions (Addendum)
Instructions after Total Knee Replacement   James P. Angie Fava., M.D.    Dept. of Orthopaedics & Sports Medicine [facility] [address] [place], [place]  [address]  Phone: [phone]   Fax: [phone]       [email or web address]       DIET: Drink plenty of non-alcoholic fluids. Resume your normal diet. Include foods high in fiber.  ACTIVITY:  You may use crutches or a walker with weight-bearing as tolerated, unless instructed otherwise. You may be weaned off of the walker or crutches by your Physical Therapist.  Do NOT place pillows under the knee. Anything placed under the knee could limit your ability to straighten the knee.   Continue doing gentle exercises. Exercising will reduce the pain and swelling, increase motion, and prevent muscle weakness.   Please continue to use the TED compression stockings for 6 weeks. You may remove the stockings at night, but should reapply them in the morning. Do not drive or operate any equipment until instructed.  WOUND CARE:  Continue to use the PolarCare or ice packs periodically to reduce pain and swelling. You may bathe or shower after the staples are removed at the first office visit following surgery.  MEDICATIONS: You may resume your regular medications. Please take the pain medication as prescribed on the medication. Do not take pain medication on an empty stomach. You have been given a prescription for a blood thinner (Lovenox or Coumadin). Please take the medication as instructed. (NOTE: After completing a 2 week course of Lovenox, take one Enteric-coated aspirin once a day. This along with elevation will help reduce the possibility of phlebitis in your operated leg.) Do not drive or drink alcoholic beverages when taking pain medications.  CALL THE OFFICE FOR: Temperature above 101 degrees Excessive bleeding or drainage on the dressing. Excessive swelling, coldness, or paleness of the toes. Persistent nausea and  vomiting.  FOLLOW-UP:  You should have an appointment to return to the office in 10-14 days after surgery. Arrangements have been made for continuation of Physical Therapy (either home therapy or outpatient therapy).     [facility] Department Directory         [email or web address]       [email or web address]          Cardiology  Appointments: [place] - [phone]  Endocrinology  Appointments: [place] [phone] [place] - [phone]  Gastroenterology  Appointments: [place] [phone] [place] - [phone]        General Surgery   Appointments: [facility]  Internal Medicine/Family Medicine  Appointments: [facility] - [phone] [place] - [phone]  Metabolic and Weigh Loss Surgery  Appointments: [facility]        Neurology  Appointments: [place] [phone] [place] - [phone]  Neurosurgery  Appointments: [place]  Obstetrics & Gynecology  Appointments: [place] [phone] [place] - [phone]        Pediatrics  Appointments: [place] [phone] [place] - [phone]  Physiatry  Appointments: [place] [phone]  Physical Therapy  Appointments: [place] - [phone]        Podiatry  Appointments: [place] [phone] [place] - [phone]  Pulmonology  Appointments: [place]  Rheumatology  Appointments: [place] [phone]        [place] Location: [facility]  [address] [place], [place]  [address]  [place] Location: [facility] [address] [place], [place]  [address]  [place] Location: [facility] [address]  [address] [place], [place]  [address]   POLAR CARE INFORMATION  [email or web address]  How to use Breg Polar Care [facility] Therapy System?  YouTube    [email or web address]  OPERATING INSTRUCTIONS  Start the product With dry hands, connect the transformer to the electrical connection located on the top of the cooler. Next, plug the transformer into an appropriate electrical outlet. The unit will automatically start running at this point.  To stop the pump, disconnect electrical power.  Unplug to stop the product when not in use. Unplugging the Polar Care unit turns it off. Always unplug immediately after use. Never leave it plugged in while unattended. Remove pad.    FIRST ADD WATER TO FILL LINE, THEN ICE---Replace ice when existing ice is almost melted  1 Discuss Treatment with your Licensed Health Care Practitioner and Use Only as Prescribed 2 Apply Insulation Barrier & Cold Therapy Pad 3 Check for Moisture 4 Inspect Skin Regularly  Tips and Trouble Shooting Usage Tips 1. Use cubed or chunked ice for optimal performance. 2. It is recommended to drain the Pad between uses. To drain the pad, hold the Pad upright with the hose pointed toward the ground. Depress the black plunger and allow water to drain out. 3. You may disconnect the Pad from the unit without removing the pad from the affected area by depressing the silver tabs on the hose coupling and gently pulling the hoses apart. The Pad and unit will seal itself and will not leak. Note: Some dripping during release is normal. 4. DO NOT RUN PUMP WITHOUT WATER! The pump in this unit is designed to run with water. Running the unit without water will cause permanent damage to the pump. 5. Unplug unit before removing lid.  TROUBLESHOOTING GUIDE Pump not running, Water not flowing to the pad, Pad is not getting cold 1. Make sure the transformer is plugged into the wall outlet. 2. Confirm that the ice and water are filled to the indicated levels. 3. Make sure there are no kinks in the pad. 4. Gently pull on the blue tube to make sure the tube/pad junction is  straight. 5. Remove the pad from the treatment site and ll it while the pad is lying at; then reapply. 6. Confirm that the pad couplings are securely attached to the unit. Listen for the double clicks (Figure 1) to confirm the pad couplings are securely attached.  Leaks    Note: Some condensation on the lines, controller, and pads is unavoidable, especially in warmer climates. 1. If using a Breg Polar Care Cold Therapy unit with a detachable Cold Therapy Pad, and a leak exists (other than condensation on the lines) disconnect the pad couplings. Make sure the silver tabs on the couplings are depressed before reconnecting the pad to the pump hose; then confirm both sides of the coupling are properly clicked in. 2. If the coupling continues to leak or a leak is detected in the pad itself, stop using it and call Breg Customer Care at [phone].  Cleaning After use, empty and dry the unit with a soft cloth. Warm water and mild detergent may be used occasionally to clean the pump and tubes.  WARNING: The Polar Care Cube can be cold enough to cause serious injury, including full skin necrosis. Follow these Operating Instructions, and carefully read the Product Insert (see pouch on side of unit) and the Cold Therapy Pad Fitting Instructions (provided with each Cold Therapy Pad) prior to use.

## 2022-11-20 ENCOUNTER — Encounter
Admission: RE | Admit: 2022-11-20 | Discharge: 2022-11-20 | Disposition: A | Discharge status: Home / Self Care | Payer: Medicare PPO | Source: Ambulatory Visit | Attending: Orthopedic Surgery | Admitting: Orthopedic Surgery

## 2022-11-20 VITALS — BP 131/77 | HR 100 | Resp 14 | Ht 67.0 in | Wt 196.9 lb

## 2022-11-20 DIAGNOSIS — Z01818 Encounter for other preprocedural examination: Secondary | ICD-10-CM | POA: Insufficient documentation

## 2022-11-20 DIAGNOSIS — Z22322 Carrier or suspected carrier of Methicillin resistant Staphylococcus aureus: Secondary | ICD-10-CM

## 2022-11-20 DIAGNOSIS — M1712 Unilateral primary osteoarthritis, left knee: Secondary | ICD-10-CM | POA: Diagnosis not present

## 2022-11-20 DIAGNOSIS — E119 Type 2 diabetes mellitus without complications: Secondary | ICD-10-CM | POA: Diagnosis not present

## 2022-11-20 HISTORY — DX: Atherosclerosis of aorta: I70.0

## 2022-11-20 HISTORY — DX: Overactive bladder: N32.81

## 2022-11-20 HISTORY — DX: Type 2 diabetes mellitus without complications: E11.9

## 2022-11-20 HISTORY — DX: Bacteremia: R78.81

## 2022-11-20 HISTORY — DX: Carrier or suspected carrier of methicillin resistant Staphylococcus aureus: Z22.322

## 2022-11-20 HISTORY — DX: Atherosclerotic heart disease of native coronary artery without angina pectoris: I25.10

## 2022-11-20 HISTORY — DX: Other intervertebral disc degeneration, lumbar region without mention of lumbar back pain or lower extremity pain: M51.369

## 2022-11-20 HISTORY — DX: Type 2 diabetes mellitus with diabetic neuropathy, unspecified: E11.40

## 2022-11-20 HISTORY — DX: Occlusion and stenosis of bilateral carotid arteries: I65.23

## 2022-11-20 HISTORY — DX: Diverticulosis of intestine, part unspecified, without perforation or abscess without bleeding: K57.90

## 2022-11-20 HISTORY — DX: Metabolic encephalopathy: G93.41

## 2022-11-20 HISTORY — DX: Benign paroxysmal vertigo, unspecified ear: H81.10

## 2022-11-20 LAB — COMPREHENSIVE METABOLIC PANEL
ALT: 17 U/L (ref 0–44)
AST: 19 U/L (ref 15–41)
Albumin: 4 g/dL (ref 3.5–5.0)
Alkaline Phosphatase: 35 U/L — ABNORMAL LOW (ref 38–126)
Anion gap: 12 (ref 5–15)
BUN: 17 mg/dL (ref 8–23)
CO2: 27 mmol/L (ref 22–32)
Calcium: 9.2 mg/dL (ref 8.9–10.3)
Chloride: 104 mmol/L (ref 98–111)
Creatinine, Ser: 0.9 mg/dL (ref 0.44–1.00)
GFR, Estimated: >60 mL/min (ref >60)
Glucose, Bld: 149 mg/dL — ABNORMAL HIGH (ref 70–99)
Potassium: 4.8 mmol/L (ref 3.5–5.1)
Sodium: 143 mmol/L (ref 135–145)
Total Bilirubin: 0.2 mg/dL (ref <1.2)
Total Protein: 6.4 g/dL — ABNORMAL LOW (ref 6.5–8.1)

## 2022-11-20 LAB — CBC
HCT: 38.9 % (ref 36.0–46.0)
Hemoglobin: 12.9 g/dL (ref 12.0–15.0)
MCH: 30.6 pg (ref 26.0–34.0)
MCHC: 33.2 g/dL (ref 30.0–36.0)
MCV: 92.2 fL (ref 80.0–100.0)
Platelets: 179 10*3/uL (ref 150–400)
RBC: 4.22 MIL/uL (ref 3.87–5.11)
RDW: 12.1 % (ref 11.5–15.5)
WBC: 6.2 10*3/uL (ref 4.0–10.5)
nRBC: 0 % (ref 0.0–0.2)

## 2022-11-20 LAB — HEMOGLOBIN A1C
Hgb A1c MFr Bld: 7.3 % — ABNORMAL HIGH (ref 4.8–5.6)
Mean Plasma Glucose: 162.81 mg/dL

## 2022-11-20 LAB — SURGICAL PCR SCREEN
MRSA, PCR: POSITIVE — AB
Staphylococcus aureus: POSITIVE — AB

## 2022-11-20 LAB — C-REACTIVE PROTEIN: CRP: 0.8 mg/dL (ref <1.0)

## 2022-11-20 LAB — SEDIMENTATION RATE: Sed Rate: 5 mm/h (ref 0–30)

## 2022-11-20 NOTE — Patient Instructions (Addendum)
Your procedure is scheduled on:11-27-22 Wednesday Report to the Registration Desk on the 1st floor of the [facility].Then proceed to the 2nd floor Surgery Desk To find out your arrival time, please call [phone] between 1PM - 3PM on:11-26-22 Tuesday If your arrival time is 6:00 am, do not arrive before that time as the [facility] Mall entrance doors do not open until 6:00 am.  REMEMBER: Instructions that are not followed completely may result in serious medical risk, up to and including death; or upon the discretion of your surgeon and anesthesiologist your surgery may need to be rescheduled.  Do not eat food after midnight the night before surgery.  No gum chewing or hard candies.  You may however, drink Water up to 2 hours before you are scheduled to arrive for your surgery. Do not drink anything within 2 hours of your scheduled arrival time.  In addition, your doctor has ordered for you to drink the provided:  Gatorade G2 Drinking this carbohydrate drink up to two hours before surgery helps to reduce insulin resistance and improve patient outcomes. Please complete drinking 2 hours before scheduled arrival time.  One week prior to surgery:Stop NOW (11-20-22) Stop Anti-inflammatories (NSAIDS) such as Advil, Aleve, Ibuprofen, Motrin, Naproxen, Naprosyn and Aspirin based products such as Excedrin, Goody's Powder, BC Powder. Stop ANY OVER THE COUNTER supplements until after surgery (Vital Hair Complex)  You may however, continue to take Tylenol if needed for pain up until the day of surgery.  Stop metFORMIN (GLUCOPHAGE-XR) 2 days prior to surgery-Last dose will be on 11-24-22 Sunday  Continue taking all of your other prescription medications up until the day of surgery.  ON THE DAY OF SURGERY ONLY TAKE THESE MEDICATIONS WITH SIPS OF WATER: -buPROPion (WELLBUTRIN XL)  -pantoprazole (PROTONIX)  -tolterodine (DETROL)  -venlafaxine XR (EFFEXOR-XR)   Continue your 81 mg Aspirin up  until the day prior to surgery-Do NOT take the morning of surgery  No Alcohol for 24 hours before or after surgery.  No Smoking including e-cigarettes for 24 hours before surgery.  No chewable tobacco products for at least 6 hours before surgery.  No nicotine patches on the day of surgery.  Do not use any "recreational" drugs for at least a week (preferably 2 weeks) before your surgery.  Please be advised that the combination of cocaine and anesthesia may have negative outcomes, up to and including death. If you test positive for cocaine, your surgery will be cancelled.  On the morning of surgery brush your teeth with toothpaste and water, you may rinse your mouth with mouthwash if you wish. Do not swallow any toothpaste or mouthwash.  Use CHG Soap as directed on instruction sheet.  Do not wear jewelry, make-up, hairpins, clips or nail polish.  For welded (permanent) jewelry: bracelets, anklets, waist bands, etc.  Please have this removed prior to surgery.  If it is not removed, there is a chance that hospital personnel will need to cut it off on the day of surgery.  Do not wear lotions, powders, or perfumes.   Do not shave body hair from the neck down 48 hours before surgery.  Contact lenses, hearing aids and dentures may not be worn into surgery.  Do not bring valuables to the hospital. [facility] is not responsible for any missing/lost belongings or valuables.   Notify your doctor if there is any change in your medical condition (cold, fever, infection).  Wear comfortable clothing (specific to your surgery type) to the hospital.  After surgery, you can help prevent lung complications by doing breathing exercises.  Take deep breaths and cough every 1-2 hours. Your doctor may order a device called an Incentive Spirometer to help you take deep breaths. When coughing or sneezing, hold a pillow firmly against your incision with both hands. This is called "splinting." Doing this  helps protect your incision. It also decreases belly discomfort.  If you are being admitted to the hospital overnight, leave your suitcase in the car. After surgery it may be brought to your room.  In case of increased patient census, it may be necessary for you, the patient, to continue your postoperative care in the Same Day Surgery department.  If you are being discharged the day of surgery, you will not be allowed to drive home. You will need a responsible individual to drive you home and stay with you for 24 hours after surgery.   If you are taking public transportation, you will need to have a responsible individual with you.  Please call the Pre-admissions Testing Dept. at [phone] if you have any questions about these instructions.  Surgery Visitation Policy:  Patients having surgery or a procedure may have two visitors.  Children under the age of 81 must have an adult with them who is not the patient.  Inpatient Visitation:    Visiting hours are 7 a.m. to 8 p.m. Up to four visitors are allowed at one time in a patient room. The visitors may rotate out with other people during the day.  One visitor age 15 or older may stay with the patient overnight and must be in the room by 8 p.m.   Pre-operative 5 CHG Bath Instructions   You can play a key role in reducing the risk of infection after surgery. Your skin needs to be as free of germs as possible. You can reduce the number of germs on your skin by washing with CHG (chlorhexidine gluconate) soap before surgery. CHG is an antiseptic soap that kills germs and continues to kill germs even after washing.   DO NOT use if you have an allergy to chlorhexidine/CHG or antibacterial soaps. If your skin becomes reddened or irritated, stop using the CHG and notify one of our RNs at [phone].   Please shower with the CHG soap starting 4 days before surgery using the following schedule:     Please keep in mind the following:  DO  NOT shave, including legs and underarms, starting the day of your first shower.   You may shave your face at any point before/day of surgery.  Place clean sheets on your bed the day you start using CHG soap. Use a clean washcloth (not used since being washed) for each shower. DO NOT sleep with pets once you start using the CHG.   CHG Shower Instructions:  If you choose to wash your hair and private area, wash first with your normal shampoo/soap.  After you use shampoo/soap, rinse your hair and body thoroughly to remove shampoo/soap residue.  Turn the water OFF and apply about 3 tablespoons (45 ml) of CHG soap to a CLEAN washcloth.  Apply CHG soap ONLY FROM YOUR NECK DOWN TO YOUR TOES (washing for 3-5 minutes)  DO NOT use CHG soap on face, private areas, open wounds, or sores.  Pay special attention to the area where your surgery is being performed.  If you are having back surgery, having someone wash your back for you may be helpful. Wait 2 minutes after  CHG soap is applied, then you may rinse off the CHG soap.  Pat dry with a clean towel  Put on clean clothes/pajamas   If you choose to wear lotion, please use ONLY the CHG-compatible lotions on the back of this paper.     Additional instructions for the day of surgery: DO NOT APPLY any lotions, deodorants, cologne, or perfumes.   Put on clean/comfortable clothes.  Brush your teeth.  Ask your nurse before applying any prescription medications to the skin.      CHG Compatible Lotions   Aveeno Moisturizing lotion  Cetaphil Moisturizing Cream  Cetaphil Moisturizing Lotion  Clairol Herbal Essence Moisturizing Lotion, Dry Skin  Clairol Herbal Essence Moisturizing Lotion, Extra Dry Skin  Clairol Herbal Essence Moisturizing Lotion, Normal Skin  Curel Age Defying Therapeutic Moisturizing Lotion with Alpha Hydroxy  Curel Extreme Care Body Lotion  Curel Soothing Hands Moisturizing Hand Lotion  Curel Therapeutic Moisturizing Cream,  Fragrance-Free  Curel Therapeutic Moisturizing Lotion, Fragrance-Free  Curel Therapeutic Moisturizing Lotion, Original Formula  Eucerin Daily Replenishing Lotion  Eucerin Dry Skin Therapy Plus Alpha Hydroxy Crme  Eucerin Dry Skin Therapy Plus Alpha Hydroxy Lotion  Eucerin Original Crme  Eucerin Original Lotion  Eucerin Plus Crme Eucerin Plus Lotion  Eucerin TriLipid Replenishing Lotion  Keri Anti-Bacterial Hand Lotion  Keri Deep Conditioning Original Lotion Dry Skin Formula Softly Scented  Keri Deep Conditioning Original Lotion, Fragrance Free Sensitive Skin Formula  Keri Lotion Fast Absorbing Fragrance Free Sensitive Skin Formula  Keri Lotion Fast Absorbing Softly Scented Dry Skin Formula  Keri Original Lotion  Keri Skin Renewal Lotion Keri Silky Smooth Lotion  Keri Silky Smooth Sensitive Skin Lotion  Nivea Body Creamy Conditioning Oil  Nivea Body Extra Enriched Lotion  Nivea Body Original Lotion  Nivea Body Sheer Moisturizing Lotion Nivea Crme  Nivea Skin Firming Lotion  NutraDerm 30 Skin Lotion  NutraDerm Skin Lotion  NutraDerm Therapeutic Skin Cream  NutraDerm Therapeutic Skin Lotion  ProShield Protective Hand Cream  Provon moisturizing lotion  How to Use an Incentive Spirometer An incentive spirometer is a tool that measures how well you are filling your lungs with each breath. Learning to take long, deep breaths using this tool can help you keep your lungs clear and active. This may help to reverse or lessen your chance of developing breathing (pulmonary) problems, especially infection. You may be asked to use a spirometer: After a surgery. If you have a lung problem or a history of smoking. After a long period of time when you have been unable to move or be active. If the spirometer includes an indicator to show the highest number that you have reached, your health care provider or respiratory therapist will help you set a goal. Keep a log of your progress as told by  your health care provider. What are the risks? Breathing too quickly may cause dizziness or cause you to pass out. Take your time so you do not get dizzy or light-headed. If you are in pain, you may need to take pain medicine before doing incentive spirometry. It is harder to take a deep breath if you are having pain. How to use your incentive spirometer  Sit up on the edge of your bed or on a chair. Hold the incentive spirometer so that it is in an upright position. Before you use the spirometer, breathe out normally. Place the mouthpiece in your mouth. Make sure your lips are closed tightly around it. Breathe in slowly and as deeply as you  can through your mouth, causing the piston or the ball to rise toward the top of the chamber. Hold your breath for 3-5 seconds, or for as long as possible. If the spirometer includes a coach indicator, use this to guide you in breathing. Slow down your breathing if the indicator goes above the marked areas. Remove the mouthpiece from your mouth and breathe out normally. The piston or ball will return to the bottom of the chamber. Rest for a few seconds, then repeat the steps 10 or more times. Take your time and take a few normal breaths between deep breaths so that you do not get dizzy or light-headed. Do this every 1-2 hours when you are awake. If the spirometer includes a goal marker to show the highest number you have reached (best effort), use this as a goal to work toward during each repetition. After each set of 10 deep breaths, cough a few times. This will help to make sure that your lungs are clear. If you have an incision on your chest or abdomen from surgery, place a pillow or a rolled-up towel firmly against the incision when you cough. This can help to reduce pain while taking deep breaths and coughing. General tips When you are able to get out of bed: Walk around often. Continue to take deep breaths and cough in order to clear your  lungs. Keep using the incentive spirometer until your health care provider says it is okay to stop using it. If you have been in the hospital, you may be told to keep using the spirometer at home. Contact a health care provider if: You are having difficulty using the spirometer. You have trouble using the spirometer as often as instructed. Your pain medicine is not giving enough relief for you to use the spirometer as told. You have a fever. Get help right away if: You develop shortness of breath. You develop a cough with bloody mucus from the lungs. You have fluid or blood coming from an incision site after you cough. Summary An incentive spirometer is a tool that can help you learn to take long, deep breaths to keep your lungs clear and active. You may be asked to use a spirometer after a surgery, if you have a lung problem or a history of smoking, or if you have been inactive for a long period of time. Use your incentive spirometer as instructed every 1-2 hours while you are awake. If you have an incision on your chest or abdomen, place a pillow or a rolled-up towel firmly against your incision when you cough. This will help to reduce pain. Get help right away if you have shortness of breath, you cough up bloody mucus, or blood comes from your incision when you cough. This information is not intended to replace advice given to you by your health care provider. Make sure you discuss any questions you have with your health care provider. Document Revised: 03/15/2019 Document Reviewed: 03/15/2019 Elsevier Patient Education  2024 [employer].

## 2022-11-20 NOTE — Progress Notes (Signed)
  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 She is scheduled to receive CEFAZOLIN pre-operatively. Pre-surgical PCR (+) for MRSA; see above.  Preoperative testing revealed a (+) MRSA PCR screen. Patient will need additional preoperative antimicrobial coverage for this pathogen.  Sending result to primary attending surgeon for review.  Surgeon to place further orders as deemed appropriate for this case.  Result added to medical history in [facility]. No further follow up from PAT required at this time.   Quentin Mulling, MSN, APRN, FNP-C, CEN [facility]  Perioperative Services Nurse Practitioner Phone: [phone] 11/20/22 7:51 PM

## 2022-11-21 ENCOUNTER — Other Ambulatory Visit: Payer: Medicare PPO

## 2022-11-22 DIAGNOSIS — Z01818 Encounter for other preprocedural examination: Secondary | ICD-10-CM | POA: Diagnosis not present

## 2022-11-22 LAB — URINALYSIS, COMPLETE (UACMP) WITH MICROSCOPIC
Bacteria, UA: NONE SEEN
Bilirubin Urine: NEGATIVE
Glucose, UA: 150 mg/dL — AB
Hgb urine dipstick: NEGATIVE
Ketones, ur: NEGATIVE mg/dL
Nitrite: NEGATIVE
Protein, ur: NEGATIVE mg/dL
Specific Gravity, Urine: 1.017 (ref 1.005–1.030)
pH: 5 (ref 5.0–8.0)

## 2022-11-25 ENCOUNTER — Encounter: Payer: Self-pay | Admitting: Urgent Care

## 2022-11-25 NOTE — Progress Notes (Incomplete)
Perioperative / Anesthesia Services  Pre-Admission Testing Clinical Review / Pre-Operative Anesthesia Consult  Date: 11/26/22  Patient Demographics:  Name: Madison Cuevas DOB:   March 04, 1945 MRN:   [id number]  Planned Surgical Procedure(s):    Case: [id number] Date/Time: 11/27/22 1110   Procedure: COMPUTER ASSISTED TOTAL KNEE ARTHROPLASTY (Left: Knee)   Anesthesia type: Choice   Pre-op diagnosis: PRIMARY OSTEOARTHRITIS OF LEFT KNEE.   Location: [facility] OR ROOM 01 / [facility] FOR ANEST[facility]   Surgeons: Donato Heinz, MD     NOTE: Available PAT nursing documentation and vital signs have been reviewed. Clinical nursing staff has updated patient's PMH/PSHx, current medication list, and drug allergies/intolerances to ensure comprehensive history available to assist in medical decision making as it pertains to the aforementioned surgical procedure and anticipated anesthetic course. Extensive review of available clinical information personally performed. [place] PMH and PSHx updated with any diagnoses/procedures that  may have been inadvertently omitted during her intake with the pre-admission testing department's nursing staff.  Clinical Discussion:  Madison Cuevas is a 77 y.o. female who is submitted for pre-surgical anesthesia review and clearance prior to her undergoing the above procedure. Patient is a Former Smoker (20 pack years; quit 01/1997). 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   Patient is followed by cardiology Gwen Pounds, MD). She was last seen in the cardiology clinic on 02/20/2022; notes reviewed. At the time of her clinic visit, patient doing well overall from a cardiovascular perspective. Patient denied any chest  pain, shortness of breath, PND, orthopnea, palpitations, significant peripheral edema, weakness, fatigue, vertiginous symptoms, or presyncope/syncope. Patient with a past medical history significant for cardiovascular diagnoses. Documented physical exam was grossly benign, providing no evidence of acute exacerbation and/or decompensation of the patient's known cardiovascular conditions.  Patient underwent TTE on 06/27/2020 that revealed a normal left ventricular systolic function with an EF of 60-65%.  Most notable on the study was a mild to moderate mitral valve stenosis with a mean transvalvular gradient of 6.0 mmHg.  CT imaging of the chest performed on 06/27/2020 revealed coronary artery calcifications mainly in the LAD distribution.  Patient presented to the ED on 10/28/2021 with stroke like symptoms; was experiencing weakness, slurred speech, and altered mental status.  Patient was experiencing difficulty with ambulation.  Of note, patient with a history of 2 weeks of diarrhea prior to coming in.  She was orthostatic upon arrival.  CT and MRI imaging of the head was negative for evidence of any acute intracranial abnormalities.  IV fluid resuscitation administered.  Differential was TIA. Patient was admitted for further evaluation.  Neurology was consulted and TIA was ruled out.  Symptoms felt to be medication mediated secondary to the use of meclizine.  Patient improved clinically and was ultimately discharged home 2 days later (10/30/2021).  Most recent TTE was performed on 10/29/2021 revealing a normal left ventricular systolic function with a hyperdynamic LVEF of 65-70%.  There were no regional wall motion abnormalities.  Diastolic Doppler parameters were normal.  Right ventricular size and function normal.  There was mild mitral valve regurgitation with no evidence of the previously observed stenotic valve.  There was trivial aortic valve regurgitation.  All transvalvular gradients were noted to  be normal providing no evidence suggestive of valvular stenosis.  Aorta normal in size with no  evidence of aneurysmal dilatation.  Blood pressure mildly elevated at 148/90 mmHg without the use of pharmacological intervention.  Patient previously on ARB monotherapy using losartan, however it was discontinued during a hospital admission.  Patient is on simvastatin for her HLD diagnosis and ASCVD prevention.  At the time of her clinic visit with cardiology, A1c loosely controlled at 7.8% when checked on 02/19/2022.  Of note, since that time, patient has had A1c levels rechecked 2 additional times with results being 7.5% (05/21/2022) and 7.7% (08/20/2022). Patient does not have an OSAH diagnosis.  For her age, patient with a stable functional capacity overall.  She is able to walk her dog and participate in exercise classes without cardiovascular limitation.  Per the [facility], patient is able to exceed 4 METS of physical activity without experiencing any significant degree of angina/anginal equivalent symptoms.  Given her elevated blood pressure, recommendations were made for patient to resume previously prescribed losartan dose.  No other changes were made to her medication regimen.  Patient to follow-up with outpatient cardiology in 1 year or sooner if needed.  Madison Cuevas is scheduled for an elective COMPUTER ASSISTED TOTAL KNEE ARTHROPLASTY (Left: Knee) on 11/27/2022 with Dr. Francesco Sor, MD.  Given patient's past medical history significant for cardiovascular diagnoses, presurgical cardiac clearance was sought by the PAT team. Per cardiology, "this patient is optimized for surgery and may proceed with the planned procedural course with a MODERATE risk of significant perioperative cardiovascular complications".  In review of her medication reconciliation, it is noted that patient is currently on prescribed daily antithrombotic therapy.  Given patient's cardiovascular history, orthopedics has cleared patient to  continue her daily low-dose ASA throughout her perioperative course.  Patient denies previous perioperative complications with anesthesia in the past. In review of the available records, it is noted that patient underwent a MAC anesthetic course at [facility] ([facility] in 10/2021 without documented complications.      11/20/2022    3:19 PM 11/20/2022    3:00 PM 10/30/2021   11:19 AM  Vitals with BMI  Height  5\' 7"    Weight  196 lbs 14 oz   BMI  30.83   Systolic 131  138  Diastolic 77  78  Pulse 100  96    Providers/Specialists:   NOTE: Primary physician provider listed below. Patient may have been seen by APP or partner within same practice.   PROVIDER ROLE / SPECIALTY LAST OV  Hooten, Illene Labrador, MD Orthopedics (Surgeon) 11/19/2022  Kandyce Rud, MD Primary Care Provider 10/29/2022  Tiajuana Amass, MD Cardiology 02/20/2022  Wendall Mola, MD Endocrinology 11/22/2022   Allergies:  Trulicity [dulaglutide] and Adhesive [tape]  Current Home Medications:   No current facility-administered medications for this encounter.    aspirin EC 81 MG tablet   buPROPion (WELLBUTRIN XL) 150 MG 24 hr tablet   carboxymethylcellulose (REFRESH PLUS) 0.5 % SOLN   glipiZIDE (GLUCOTROL XL) 10 MG 24 hr tablet   glipiZIDE (GLUCOTROL XL) 5 MG 24 hr tablet   losartan (COZAAR) 25 MG tablet   metFORMIN (GLUCOPHAGE-XR) 500 MG 24 hr tablet   OVER THE COUNTER MEDICATION   pantoprazole (PROTONIX) 40 MG tablet   progesterone (PROMETRIUM) 100 MG capsule   simvastatin (ZOCOR) 40 MG tablet   tolterodine (DETROL) 2 MG tablet   triamcinolone (NASACORT ALLERGY 24HR) 55 MCG/ACT AERO nasal inhaler   venlafaxine XR (EFFEXOR-XR) 75 MG 24 hr capsule   amoxicillin (AMOXIL) 500 MG capsule   History:  Past Medical History:  Diagnosis Date   Acute metabolic encephalopathy 10/28/2021   a.) initally felt to be TIA (ruled out by neurology); symptoms related to medication mediated metabolic  encephalopathy caused by meclizine per neurology   Aortic atherosclerosis (HCC)    BPPV (benign paroxysmal positional vertigo)    Carotid atherosclerosis, bilateral    Coronary artery calcification seen on CT scan    DDD (degenerative disc disease), lumbar    Depression    Diabetic neuropathy (HCC)    Diastolic dysfunction    a.) TTE 06/27/2020: EF 60-65%, no RWMAs, G1DD, triv MR, mild-mod MS (MPG 6 mmHg(, mild AoV calc with mild-mod sclerosis (no stenosis); b.) TEE 06/29/2020: EF 60-65%, no LAA thrombus, mild MR, triv AR, no IAS; c.) TTE 10/29/2021: EF 65-70%, no RWMAs, mild MR, triv AR   Diverticulosis    DM (diabetes mellitus), type 2 (HCC)    DVT (deep venous thrombosis) (HCC) 07/2020   GERD (gastroesophageal reflux disease)    Herpes zoster    History of bilateral cataract extraction    History of chicken pox    Hyperlipidemia    Hypertension    Long term current use of aspirin    Mitral stenosis 06/27/2020   a.) TTE 06/27/2020: mild-mod MS (MPG 6.0 mmHg)   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   a.) surgical PCR (+) 11/20/2022 prior to LEFT TKA   OAB (overactive bladder)    Osteoarthrosis    PE (pulmonary thromboembolism) (HCC) 07/2020   a.) s/p Tx with 6 months DOAC therapy   Pneumonia    PVC's (premature ventricular contractions)    Sepsis due to gram negative bacteria (Pasturella multocida) 06/26/2020   Small vessel disease, cerebrovascular    Wears partial dentures (lower)    Past Surgical History:  Procedure Laterality Date   APPENDECTOMY     BACK SURGERY     lumbar x2   CATARACT EXTRACTION W/PHACO Right 09/09/2018   Procedure: CATARACT EXTRACTION PHACO AND INTRAOCULAR LENS PLACEMENT (IOC)  RIGHT DIABETIC  01:01.5  16.2%  9.99;  Surgeon: Lockie Mola, MD;  Location: [facility];  Service: Ophthalmology;  Laterality: Right;  Diabetic - oral meds   CATARACT EXTRACTION W/PHACO Left 10/17/2021   Procedure: CATARACT EXTRACTION PHACO AND INTRAOCULAR LENS  PLACEMENT (IOC) LEFT DIABETIC;  Surgeon: Lockie Mola, MD;  Location: [facility];  Service: Ophthalmology;  Laterality: Left;  Diabetic 11.78 01:11.2   COLONOSCOPY WITH PROPOFOL N/A 08/21/2015   Procedure: COLONOSCOPY WITH PROPOFOL;  Surgeon: Christena Deem, MD;  Location: [facility];  Service: Endoscopy;  Laterality: N/A;   ESOPHAGOGASTRODUODENOSCOPY (EGD) WITH PROPOFOL N/A 06/21/2020   Procedure: ESOPHAGOGASTRODUODENOSCOPY (EGD) WITH PROPOFOL;  Surgeon: Toledo, Boykin Nearing, MD;  Location: [facility] ENDOSCOPY;  Service: Gastroenterology;  Laterality: N/A;   JOINT REPLACEMENT Right    knee   TEE WITHOUT CARDIOVERSION N/A 06/29/2020   Procedure: TRANSESOPHAGEAL ECHOCARDIOGRAM (TEE);  Surgeon: Lamar Blinks, MD;  Location: [facility] ORS;  Service: Cardiovascular;  Laterality: N/A;   TONSILLECTOMY     TUBAL LIGATION     Family History  Problem Relation Age of Onset   Breast cancer Cousin    Social History   Tobacco Use   Smoking status: Former    Current packs/day: 0.00    Average packs/day: 1 pack/day for 20.0 years (20.0 ttl pk-yrs)    Types: Cigarettes    Start date: 74    Quit date: 1999    Years since quitting: 25.9   Smokeless tobacco: Never  Vaping Use   Vaping status: Never Used  Substance Use Topics   Alcohol use: Yes    Comment: may have 1-2 drinks/month   Drug use: No    Pertinent Clinical Results:  LABS:   No visits with results within 3 Day(s) from this visit.  Latest known visit with results is:  Hospital Outpatient Visit on 11/20/2022  Component Date Value Ref Range Status   MRSA, PCR 11/20/2022 POSITIVE (A)  NEGATIVE Final   Comment: RESULT CALLED TO, READ BACK BY AND VERIFIED WITH: Mckinley Olheiser [id number] 11/20/22 MU    Staphylococcus aureus 11/20/2022 POSITIVE (A)  NEGATIVE Final   Comment: (NOTE) The Xpert SA Assay (FDA approved for NASAL specimens in patients 34 years of age and older), is one component of a comprehensive surveillance  program. It is not intended to diagnose infection nor to guide or monitor treatment. Performed at [facility], [address] [place], [place] [address]    CRP 11/20/2022 0.8  <1.0 mg/dL Final   Performed at [facility], [address], [place], [place] [address]   Sed Rate 11/20/2022 5  0 - 30 mm/hr Final   Performed at [facility], [address] [place], [place] [address]   Hgb A1c MFr Bld 11/20/2022 7.3 (H)  4.8 - 5.6 % Final   Comment: (NOTE) Pre diabetes:          5.7%-6.4%  Diabetes:              >6.4%  Glycemic control for   <7.0% adults with diabetes    Mean Plasma Glucose 11/20/2022 162.81  mg/dL Final   Performed at [facility], [address] [place], [place] [address]   WBC 11/20/2022 6.2  4.0 - 10.5 K/uL Final   RBC 11/20/2022 4.22  3.87 - 5.11 MIL/uL Final   Hemoglobin 11/20/2022 12.9  12.0 - 15.0 g/dL Final   HCT 11/03/2534 38.9  36.0 - 46.0 % Final   MCV 11/20/2022 92.2  80.0 - 100.0 fL Final   MCH 11/20/2022 30.6  26.0 - 34.0 pg Final   MCHC 11/20/2022 33.2  30.0 - 36.0 g/dL Final   RDW 64/40/3474 12.1  11.5 - 15.5 % Final   Platelets 11/20/2022 179  150 - 400 K/uL Final   nRBC 11/20/2022 0.0  0.0 - 0.2 % Final   Performed at [facility], [address] [place], [place] [address]   Sodium 11/20/2022 143  135 - 145 mmol/L Final   Potassium 11/20/2022 4.8  3.5 - 5.1 mmol/L Final   Chloride 11/20/2022 104  98 - 111 mmol/L Final   CO2 11/20/2022 27  22 - 32 mmol/L Final   Glucose, Bld 11/20/2022 149 (H)  70 - 99 mg/dL Final   Glucose reference range applies only to samples taken after fasting for at least 8 hours.   BUN 11/20/2022 17  8 - 23 mg/dL Final   Creatinine, Ser 11/20/2022 0.90  0.44 - 1.00 mg/dL Final   Calcium 38/75/6433 9.2  8.9 - 10.3 mg/dL Final   Total Protein 29/51/8841 6.4 (L)  6.5 - 8.1 g/dL Final   Albumin 66/06/3014 4.0  3.5 - 5.0 g/dL Final   AST 01/15/3233 19  15 - 41 U/L Final   ALT  11/20/2022 17  0 - 44 U/L Final   Alkaline Phosphatase 11/20/2022 35 (L)  38 - 126 U/L Final   Total Bilirubin 11/20/2022 0.2  <1.2 mg/dL Final  GFR, Estimated 11/20/2022 >60  >60 mL/min Final   Comment: (NOTE) Calculated using the CKD-EPI Creatinine Equation (2021)    Anion gap 11/20/2022 12  5 - 15 Final   Performed at [facility], [address] [place], [place] [address]    ECG: Date: 11/20/2022 Time ECG obtained: 1545 PM Rate: 96 bpm Rhythm: normal sinus Axis (leads I and aVF): Normal Intervals: PR 162 ms. QRS 88 ms. QTc 462 ms. ST segment and T wave changes: No evidence of acute ST segment elevation or depression.   Comparison: Similar to previous tracing obtained on 10/28/2021   IMAGING / PROCEDURES: DIAGNOSTIC RADIOGRAPHS OF LEFT KNEE 3 VIEWS performed on 11/19/2022 Near bone-on-bone to both the medial and lateral compartment with the lateral compartment being more pronounced.   She is noted to have osteophytes in a tricompartmental fashion.   Mild subchondral sclerosis noted.   There is significant degenerative changes to the patellofemoral articulation.   The patella appears to be tracking well.   ECHOCARDIOGRAM COMPLETE performed on 10/29/2021 Left ventricular ejection fraction, by estimation, is 65 to 70%. The left ventricle has normal function. The left ventricle has no regional wall motion abnormalities. Left ventricular diastolic parameters were normal.  Right ventricular systolic function is normal. The right ventricular size is normal.  The mitral valve is normal in structure. Mild mitral valve regurgitation.  The aortic valve is normal in structure. Aortic valve regurgitation is trivial.   CT ANGIO HEAD NECK W WO CM performed on 10/29/2021 No intracranial large vessel occlusion or proximal stenosis. Aortic atherosclerosis. Atherosclerotic change at both carotid bifurcations but no stenosis.  MR BRAIN WO CONTRAST performed on 10/28/2021 No acute  intracranial abnormality. Mild age-related cerebral atrophy with chronic small vessel ischemic disease.  Impression and Plan:  Madison Cuevas has been referred for pre-anesthesia review and clearance prior to her undergoing the planned anesthetic and procedural courses. Available labs, pertinent testing, and imaging results were personally reviewed by me in preparation for upcoming operative/procedural course. [facility] medical record has been updated following extensive record review and patient interview with PAT staff.   This patient has been appropriately cleared by cardiology with an overall MODERATE risk of experiencing significant perioperative cardiovascular complications. Based on clinical review performed today (11/26/22), barring any significant acute changes in the patient's overall condition, it is anticipated that she will be able to proceed with the planned surgical intervention. Any acute changes in clinical condition may necessitate her procedure being postponed and/or cancelled. Patient will meet with anesthesia team (MD and/or CRNA) on the day of her procedure for preoperative evaluation/assessment. Questions regarding anesthetic course will be fielded at that time.   Pre-surgical instructions were reviewed with the patient during her PAT appointment, and questions were fielded to satisfaction by PAT clinical staff. She has been instructed on which medications that she will need to hold prior to surgery, as well as the ones that have been deemed safe/appropriate to take on the day of her procedure. As part of the general education provided by PAT, patient made aware both verbally and in writing, that she would need to abstain from the use of any illegal substances during her perioperative course.  She was advised that failure to follow the provided instructions could necessitate case cancellation or result in serious perioperative complications up to and including death. Patient  encouraged to contact PAT and/or her surgeon's office to discuss any questions or concerns that may arise prior to surgery; verbalized understanding.  Quentin Mulling, MSN, APRN, FNP-C, CEN [facility]  Perioperative Services Nurse Practitioner Phone: [phone] Fax: [phone] 11/26/22 7:47 AM  NOTE: This note has been prepared using Dragon dictation software. Despite my best ability to proofread, there is always the potential that unintentional transcriptional errors may still occur from this process.

## 2022-11-26 ENCOUNTER — Encounter: Payer: Self-pay | Admitting: Orthopedic Surgery

## 2022-11-26 MED ORDER — DEXAMETHASONE SODIUM PHOSPHATE 10 MG/ML IJ SOLN
8.0000 mg | Freq: Once | INTRAMUSCULAR | Status: AC
  Administered 2022-11-27: 8 mg via INTRAVENOUS

## 2022-11-26 MED ORDER — CHLORHEXIDINE GLUCONATE 4 % EX SOLN
60.0000 mL | Freq: Once | CUTANEOUS | Status: AC
  Administered 2022-11-27: 4 via TOPICAL

## 2022-11-26 MED ORDER — VANCOMYCIN HCL IN DEXTROSE 1-5 GM/200ML-% IV SOLN
1000.0000 mg | Freq: Once | INTRAVENOUS | Status: AC
  Administered 2022-11-27: 1000 mg via INTRAVENOUS

## 2022-11-26 MED ORDER — CHLORHEXIDINE GLUCONATE 0.12 % MT SOLN
15.0000 mL | Freq: Once | OROMUCOSAL | Status: AC
  Administered 2022-11-27: 15 mL via OROMUCOSAL

## 2022-11-26 MED ORDER — CELECOXIB 200 MG PO CAPS
400.0000 mg | ORAL_CAPSULE | Freq: Once | ORAL | Status: AC
  Administered 2022-11-27: 400 mg via ORAL

## 2022-11-26 MED ORDER — CEFAZOLIN SODIUM-DEXTROSE 2-4 GM/100ML-% IV SOLN
2.0000 g | INTRAVENOUS | Status: AC
  Administered 2022-11-27: 2 g via INTRAVENOUS

## 2022-11-26 MED ORDER — ORAL CARE MOUTH RINSE: 15.0000 mL | Freq: Once | OROMUCOSAL | Status: AC

## 2022-11-26 MED ORDER — SODIUM CHLORIDE 0.9 % IV SOLN: INTRAVENOUS | Status: DC

## 2022-11-26 MED ORDER — TRANEXAMIC ACID-NACL 1000-0.7 MG/100ML-% IV SOLN
1000.0000 mg | INTRAVENOUS | Status: AC
  Administered 2022-11-27: 1000 mg via INTRAVENOUS

## 2022-11-26 MED ORDER — GABAPENTIN 300 MG PO CAPS
300.0000 mg | ORAL_CAPSULE | Freq: Once | ORAL | Status: AC
  Administered 2022-11-27: 300 mg via ORAL

## 2022-11-27 ENCOUNTER — Observation Stay: Payer: Medicare PPO

## 2022-11-27 ENCOUNTER — Other Ambulatory Visit: Payer: Self-pay

## 2022-11-27 ENCOUNTER — Encounter
Admission: RE | Disposition: A | Discharge status: Home / Self Care | Payer: Self-pay | Source: Home / Self Care | Attending: Orthopedic Surgery

## 2022-11-27 ENCOUNTER — Ambulatory Visit: Payer: Medicare PPO | Admitting: Urgent Care

## 2022-11-27 ENCOUNTER — Observation Stay
Admission: RE | Admit: 2022-11-27 | Discharge: 2022-11-28 | Disposition: A | Discharge status: Home / Self Care | Payer: Medicare PPO | Attending: Orthopedic Surgery | Admitting: Orthopedic Surgery

## 2022-11-27 ENCOUNTER — Encounter: Payer: Self-pay | Admitting: Orthopedic Surgery

## 2022-11-27 DIAGNOSIS — M1712 Unilateral primary osteoarthritis, left knee: Principal | ICD-10-CM | POA: Insufficient documentation

## 2022-11-27 DIAGNOSIS — Z79899 Other long term (current) drug therapy: Secondary | ICD-10-CM | POA: Diagnosis not present

## 2022-11-27 DIAGNOSIS — I10 Essential (primary) hypertension: Secondary | ICD-10-CM | POA: Diagnosis not present

## 2022-11-27 DIAGNOSIS — Z96651 Presence of right artificial knee joint: Secondary | ICD-10-CM | POA: Diagnosis not present

## 2022-11-27 DIAGNOSIS — Z01818 Encounter for other preprocedural examination: Secondary | ICD-10-CM

## 2022-11-27 DIAGNOSIS — E119 Type 2 diabetes mellitus without complications: Secondary | ICD-10-CM | POA: Insufficient documentation

## 2022-11-27 DIAGNOSIS — Z7984 Long term (current) use of oral hypoglycemic drugs: Secondary | ICD-10-CM | POA: Diagnosis not present

## 2022-11-27 DIAGNOSIS — Z01812 Encounter for preprocedural laboratory examination: Secondary | ICD-10-CM

## 2022-11-27 DIAGNOSIS — Z7982 Long term (current) use of aspirin: Secondary | ICD-10-CM | POA: Insufficient documentation

## 2022-11-27 DIAGNOSIS — E1142 Type 2 diabetes mellitus with diabetic polyneuropathy: Secondary | ICD-10-CM

## 2022-11-27 DIAGNOSIS — Z96652 Presence of left artificial knee joint: Secondary | ICD-10-CM

## 2022-11-27 HISTORY — DX: Ventricular premature depolarization: I49.3

## 2022-11-27 HISTORY — PX: KNEE ARTHROPLASTY: SHX992

## 2022-11-27 HISTORY — DX: Cataract extraction status, right eye: Z98.41

## 2022-11-27 HISTORY — DX: Cerebrovascular disease, unspecified: I67.9

## 2022-11-27 HISTORY — DX: Depression, unspecified: F32.A

## 2022-11-27 HISTORY — DX: Presence of dental prosthetic device (complete) (partial): Z97.2

## 2022-11-27 HISTORY — DX: Long term (current) use of aspirin: Z79.82

## 2022-11-27 HISTORY — DX: Other ill-defined heart diseases: I51.89

## 2022-11-27 LAB — GLUCOSE, CAPILLARY
Glucose-Capillary: 215 mg/dL — ABNORMAL HIGH (ref 70–99)
Glucose-Capillary: 250 mg/dL — ABNORMAL HIGH (ref 70–99)
Glucose-Capillary: 274 mg/dL — ABNORMAL HIGH (ref 70–99)
Glucose-Capillary: 236 mg/dL — ABNORMAL HIGH (ref 70–99)

## 2022-11-27 SURGERY — ARTHROPLASTY, KNEE, TOTAL, USING IMAGELESS COMPUTER-ASSISTED NAVIGATION
Anesthesia: Spinal | Site: Knee | Laterality: Left

## 2022-11-27 MED ORDER — SIMVASTATIN 40 MG PO TABS
40.0000 mg | ORAL_TABLET | Freq: Every day | ORAL | Status: DC
  Administered 2022-11-27: 40 mg via ORAL
  Filled 2022-11-27: qty 1

## 2022-11-27 MED ORDER — ACETAMINOPHEN 10 MG/ML IV SOLN
INTRAVENOUS | Status: DC | PRN
  Administered 2022-11-27: 1000 mg via INTRAVENOUS

## 2022-11-27 MED ORDER — GLIPIZIDE ER 5 MG PO TB24
5.0000 mg | ORAL_TABLET | Freq: Every day | ORAL | Status: DC
  Administered 2022-11-27: 5 mg via ORAL
  Filled 2022-11-27: qty 1

## 2022-11-27 MED ORDER — APIXABAN 2.5 MG PO TABS
2.5000 mg | ORAL_TABLET | Freq: Two times a day (BID) | ORAL | Status: DC
  Administered 2022-11-28: 2.5 mg via ORAL

## 2022-11-27 MED ORDER — ONDANSETRON HCL 4 MG/2ML IJ SOLN: 4.0000 mg | Freq: Four times a day (QID) | INTRAMUSCULAR | Status: DC | PRN

## 2022-11-27 MED ORDER — BUPIVACAINE HCL (PF) 0.25 % IJ SOLN
INTRAMUSCULAR | Status: AC
  Filled 2022-11-27: qty 60

## 2022-11-27 MED ORDER — SODIUM CHLORIDE FLUSH 0.9 % IV SOLN
INTRAVENOUS | Status: AC
  Filled 2022-11-27: qty 40

## 2022-11-27 MED ORDER — GABAPENTIN 300 MG PO CAPS
ORAL_CAPSULE | ORAL | Status: AC
  Filled 2022-11-27: qty 1

## 2022-11-27 MED ORDER — PHENOL 1.4 % MT LIQD: 1.0000 | OROMUCOSAL | Status: DC | PRN

## 2022-11-27 MED ORDER — BISACODYL 10 MG RE SUPP: 10.0000 mg | Freq: Every day | RECTAL | Status: DC | PRN

## 2022-11-27 MED ORDER — CARBOXYMETHYLCELLULOSE SODIUM 0.5 % OP SOLN: 1.0000 [drp] | Freq: Three times a day (TID) | OPHTHALMIC | Status: DC | PRN

## 2022-11-27 MED ORDER — METOCLOPRAMIDE HCL 10 MG PO TABS
ORAL_TABLET | ORAL | Status: AC
  Filled 2022-11-27: qty 1

## 2022-11-27 MED ORDER — SODIUM CHLORIDE (PF) 0.9 % IJ SOLN
INTRAMUSCULAR | Status: DC | PRN
  Administered 2022-11-27: 120 mL via INTRAMUSCULAR

## 2022-11-27 MED ORDER — TRANEXAMIC ACID-NACL 1000-0.7 MG/100ML-% IV SOLN
INTRAVENOUS | Status: AC
  Filled 2022-11-27: qty 100

## 2022-11-27 MED ORDER — ESMOLOL HCL 100 MG/10ML IV SOLN
INTRAVENOUS | Status: DC | PRN
  Administered 2022-11-27: 10 mg via INTRAVENOUS

## 2022-11-27 MED ORDER — CEFAZOLIN SODIUM-DEXTROSE 2-4 GM/100ML-% IV SOLN
2.0000 g | Freq: Four times a day (QID) | INTRAVENOUS | Status: AC
  Administered 2022-11-27 – 2022-11-28 (×2): 2 g via INTRAVENOUS

## 2022-11-27 MED ORDER — TRANEXAMIC ACID-NACL 1000-0.7 MG/100ML-% IV SOLN
1000.0000 mg | Freq: Once | INTRAVENOUS | Status: AC
  Administered 2022-11-27: 1000 mg via INTRAVENOUS

## 2022-11-27 MED ORDER — FESOTERODINE FUMARATE ER 4 MG PO TB24
4.0000 mg | ORAL_TABLET | Freq: Every day | ORAL | Status: DC
  Administered 2022-11-27 – 2022-11-28 (×2): 4 mg via ORAL
  Filled 2022-11-27 (×2): qty 1

## 2022-11-27 MED ORDER — OXYCODONE HCL 5 MG PO TABS
5.0000 mg | ORAL_TABLET | ORAL | Status: DC | PRN
  Administered 2022-11-27: 5 mg via ORAL

## 2022-11-27 MED ORDER — PROPOFOL 1000 MG/100ML IV EMUL
INTRAVENOUS | Status: AC
  Filled 2022-11-27: qty 100

## 2022-11-27 MED ORDER — DIPHENHYDRAMINE HCL 12.5 MG/5ML PO ELIX: 12.5000 mg | ORAL_SOLUTION | ORAL | Status: DC | PRN

## 2022-11-27 MED ORDER — BUPIVACAINE HCL (PF) 0.5 % IJ SOLN
INTRAMUSCULAR | Status: DC | PRN
  Administered 2022-11-27: 3 mL via INTRATHECAL

## 2022-11-27 MED ORDER — MAGNESIUM HYDROXIDE 400 MG/5ML PO SUSP
ORAL | Status: AC
  Filled 2022-11-27: qty 30

## 2022-11-27 MED ORDER — CEFAZOLIN SODIUM-DEXTROSE 2-4 GM/100ML-% IV SOLN
INTRAVENOUS | Status: AC
  Filled 2022-11-27: qty 100

## 2022-11-27 MED ORDER — OXYCODONE HCL 5 MG PO TABS
ORAL_TABLET | ORAL | Status: AC
  Filled 2022-11-27: qty 1

## 2022-11-27 MED ORDER — BUPROPION HCL ER (XL) 150 MG PO TB24
150.0000 mg | ORAL_TABLET | ORAL | Status: DC
  Administered 2022-11-28: 150 mg via ORAL
  Filled 2022-11-27: qty 1

## 2022-11-27 MED ORDER — PHENYLEPHRINE HCL-NACL 20-0.9 MG/250ML-% IV SOLN
INTRAVENOUS | Status: AC
  Filled 2022-11-27: qty 250

## 2022-11-27 MED ORDER — INSULIN ASPART 100 UNIT/ML IJ SOLN
0.0000 [IU] | Freq: Three times a day (TID) | INTRAMUSCULAR | Status: DC
  Administered 2022-11-27: 8 [IU] via SUBCUTANEOUS
  Administered 2022-11-28: 3 [IU] via SUBCUTANEOUS

## 2022-11-27 MED ORDER — TRIAMCINOLONE ACETONIDE 55 MCG/ACT NA AERO
2.0000 | INHALATION_SPRAY | Freq: Every day | NASAL | Status: DC
  Administered 2022-11-27: 2 via NASAL
  Filled 2022-11-27: qty 21.6

## 2022-11-27 MED ORDER — MIDAZOLAM HCL 2 MG/2ML IJ SOLN
INTRAMUSCULAR | Status: AC
  Filled 2022-11-27: qty 2

## 2022-11-27 MED ORDER — ACETAMINOPHEN 325 MG PO TABS: 325.0000 mg | ORAL_TABLET | Freq: Four times a day (QID) | ORAL | Status: DC | PRN

## 2022-11-27 MED ORDER — SENNOSIDES-DOCUSATE SODIUM 8.6-50 MG PO TABS
ORAL_TABLET | ORAL | Status: AC
  Filled 2022-11-27: qty 1

## 2022-11-27 MED ORDER — METFORMIN HCL ER 500 MG PO TB24
1000.0000 mg | ORAL_TABLET | Freq: Two times a day (BID) | ORAL | Status: DC
  Administered 2022-11-27 – 2022-11-28 (×2): 1000 mg via ORAL

## 2022-11-27 MED ORDER — PANTOPRAZOLE SODIUM 40 MG PO TBEC
DELAYED_RELEASE_TABLET | ORAL | Status: AC
  Filled 2022-11-27: qty 1

## 2022-11-27 MED ORDER — GLIPIZIDE ER 10 MG PO TB24
10.0000 mg | ORAL_TABLET | Freq: Every day | ORAL | Status: DC
  Administered 2022-11-28: 10 mg via ORAL
  Filled 2022-11-27: qty 1

## 2022-11-27 MED ORDER — LIDOCAINE HCL (CARDIAC) PF 100 MG/5ML IV SOSY
PREFILLED_SYRINGE | INTRAVENOUS | Status: DC | PRN
  Administered 2022-11-27: 40 mg via INTRAVENOUS

## 2022-11-27 MED ORDER — HYDROMORPHONE HCL 1 MG/ML IJ SOLN: 0.5000 mg | INTRAMUSCULAR | Status: DC | PRN

## 2022-11-27 MED ORDER — TRAMADOL HCL 50 MG PO TABS: 50.0000 mg | ORAL_TABLET | ORAL | Status: DC | PRN

## 2022-11-27 MED ORDER — PROPOFOL 10 MG/ML IV BOLUS
INTRAVENOUS | Status: DC | PRN
  Administered 2022-11-27: 100 ug/kg/min via INTRAVENOUS
  Administered 2022-11-27: 20 mg via INTRAVENOUS

## 2022-11-27 MED ORDER — ONDANSETRON HCL 4 MG PO TABS: 4.0000 mg | ORAL_TABLET | Freq: Four times a day (QID) | ORAL | Status: DC | PRN

## 2022-11-27 MED ORDER — ACETAMINOPHEN 10 MG/ML IV SOLN
1000.0000 mg | Freq: Four times a day (QID) | INTRAVENOUS | Status: DC
  Administered 2022-11-27 – 2022-11-28 (×3): 1000 mg via INTRAVENOUS

## 2022-11-27 MED ORDER — VENLAFAXINE HCL ER 75 MG PO CP24
75.0000 mg | ORAL_CAPSULE | Freq: Every day | ORAL | Status: DC
  Administered 2022-11-28: 75 mg via ORAL
  Filled 2022-11-27: qty 1

## 2022-11-27 MED ORDER — SODIUM CHLORIDE 0.9 % IR SOLN
Status: DC | PRN
  Administered 2022-11-27: 3000 mL

## 2022-11-27 MED ORDER — CELECOXIB 200 MG PO CAPS
ORAL_CAPSULE | ORAL | Status: AC
  Filled 2022-11-27: qty 1

## 2022-11-27 MED ORDER — ONDANSETRON HCL 4 MG/2ML IJ SOLN
INTRAMUSCULAR | Status: DC | PRN
  Administered 2022-11-27: 4 mg via INTRAVENOUS

## 2022-11-27 MED ORDER — DROPERIDOL 2.5 MG/ML IJ SOLN: 0.6250 mg | Freq: Once | INTRAMUSCULAR | Status: DC | PRN

## 2022-11-27 MED ORDER — MENTHOL 3 MG MT LOZG: 1.0000 | LOZENGE | OROMUCOSAL | Status: DC | PRN

## 2022-11-27 MED ORDER — BUPIVACAINE LIPOSOME 1.3 % IJ SUSP
INTRAMUSCULAR | Status: AC
  Filled 2022-11-27: qty 20

## 2022-11-27 MED ORDER — PANTOPRAZOLE SODIUM 40 MG PO TBEC
40.0000 mg | DELAYED_RELEASE_TABLET | Freq: Two times a day (BID) | ORAL | Status: DC
  Administered 2022-11-27 – 2022-11-28 (×2): 40 mg via ORAL

## 2022-11-27 MED ORDER — CHLORHEXIDINE GLUCONATE 4 % EX SOLN: 1.0000 | CUTANEOUS | 1 refills | Status: AC

## 2022-11-27 MED ORDER — PROGESTERONE MICRONIZED 100 MG PO CAPS
100.0000 mg | ORAL_CAPSULE | Freq: Every day | ORAL | Status: DC
  Administered 2022-11-27: 100 mg via ORAL
  Filled 2022-11-27: qty 1

## 2022-11-27 MED ORDER — LOSARTAN POTASSIUM 50 MG PO TABS
ORAL_TABLET | ORAL | Status: AC
  Filled 2022-11-27: qty 1

## 2022-11-27 MED ORDER — CELECOXIB 200 MG PO CAPS
ORAL_CAPSULE | ORAL | Status: AC
  Filled 2022-11-27: qty 2

## 2022-11-27 MED ORDER — LIDOCAINE HCL (PF) 2 % IJ SOLN
INTRAMUSCULAR | Status: AC
  Filled 2022-11-27: qty 5

## 2022-11-27 MED ORDER — ONDANSETRON HCL 4 MG/2ML IJ SOLN
INTRAMUSCULAR | Status: AC
  Filled 2022-11-27: qty 2

## 2022-11-27 MED ORDER — SENNOSIDES-DOCUSATE SODIUM 8.6-50 MG PO TABS
1.0000 | ORAL_TABLET | Freq: Two times a day (BID) | ORAL | Status: DC
  Administered 2022-11-27 – 2022-11-28 (×2): 1 via ORAL

## 2022-11-27 MED ORDER — ACETAMINOPHEN 10 MG/ML IV SOLN
INTRAVENOUS | Status: AC
  Filled 2022-11-27: qty 100

## 2022-11-27 MED ORDER — OXYCODONE HCL 5 MG PO TABS: 5.0000 mg | ORAL_TABLET | Freq: Once | ORAL | Status: DC | PRN

## 2022-11-27 MED ORDER — FLEET ENEMA RE ENEM: 1.0000 | ENEMA | Freq: Once | RECTAL | Status: DC | PRN

## 2022-11-27 MED ORDER — MIDAZOLAM HCL 5 MG/5ML IJ SOLN
INTRAMUSCULAR | Status: DC | PRN
  Administered 2022-11-27: 2 mg via INTRAVENOUS

## 2022-11-27 MED ORDER — LOSARTAN POTASSIUM 50 MG PO TABS
25.0000 mg | ORAL_TABLET | Freq: Every day | ORAL | Status: DC
  Administered 2022-11-27: 25 mg via ORAL

## 2022-11-27 MED ORDER — MAGNESIUM HYDROXIDE 400 MG/5ML PO SUSP
30.0000 mL | Freq: Every day | ORAL | Status: DC
Start: 2022-11-27 — End: 2022-11-28
  Administered 2022-11-28: 30 mL via ORAL

## 2022-11-27 MED ORDER — SURGIPHOR WOUND IRRIGATION SYSTEM - OPTIME: TOPICAL | Status: DC | PRN

## 2022-11-27 MED ORDER — ALUM & MAG HYDROXIDE-SIMETH 200-200-20 MG/5ML PO SUSP
30.0000 mL | ORAL | Status: DC | PRN
Start: 2022-11-27 — End: 2022-11-28

## 2022-11-27 MED ORDER — DEXAMETHASONE SODIUM PHOSPHATE 10 MG/ML IJ SOLN
INTRAMUSCULAR | Status: AC
  Filled 2022-11-27: qty 1

## 2022-11-27 MED ORDER — ESMOLOL HCL 100 MG/10ML IV SOLN
INTRAVENOUS | Status: AC
  Filled 2022-11-27: qty 10

## 2022-11-27 MED ORDER — CHLORHEXIDINE GLUCONATE 0.12 % MT SOLN
OROMUCOSAL | Status: AC
  Filled 2022-11-27: qty 15

## 2022-11-27 MED ORDER — CELECOXIB 200 MG PO CAPS
200.0000 mg | ORAL_CAPSULE | Freq: Two times a day (BID) | ORAL | Status: DC
  Administered 2022-11-27 – 2022-11-28 (×2): 200 mg via ORAL

## 2022-11-27 MED ORDER — OXYCODONE HCL 5 MG/5ML PO SOLN: 5.0000 mg | Freq: Once | ORAL | Status: DC | PRN

## 2022-11-27 MED ORDER — METFORMIN HCL ER 500 MG PO TB24
ORAL_TABLET | ORAL | Status: AC
Start: 2022-11-27 — End: ?
  Filled 2022-11-27: qty 2

## 2022-11-27 MED ORDER — MUPIROCIN 2 % EX OINT: 1.0000 | TOPICAL_OINTMENT | Freq: Two times a day (BID) | CUTANEOUS | 0 refills | Status: AC

## 2022-11-27 MED ORDER — ACETAMINOPHEN 10 MG/ML IV SOLN: 1000.0000 mg | Freq: Once | INTRAVENOUS | Status: DC | PRN

## 2022-11-27 MED ORDER — METOCLOPRAMIDE HCL 10 MG PO TABS
10.0000 mg | ORAL_TABLET | Freq: Three times a day (TID) | ORAL | Status: DC
  Administered 2022-11-27 – 2022-11-28 (×3): 10 mg via ORAL

## 2022-11-27 MED ORDER — POLYVINYL ALCOHOL 1.4 % OP SOLN: 1.0000 [drp] | OPHTHALMIC | Status: DC | PRN

## 2022-11-27 MED ORDER — INSULIN ASPART 100 UNIT/ML IJ SOLN
INTRAMUSCULAR | Status: AC
  Filled 2022-11-27: qty 1

## 2022-11-27 MED ORDER — FERROUS SULFATE 325 (65 FE) MG PO TABS
ORAL_TABLET | ORAL | Status: AC
  Filled 2022-11-27: qty 1

## 2022-11-27 MED ORDER — FERROUS SULFATE 325 (65 FE) MG PO TABS
325.0000 mg | ORAL_TABLET | Freq: Two times a day (BID) | ORAL | Status: DC
Start: 2022-11-27 — End: 2022-11-28
  Administered 2022-11-27 – 2022-11-28 (×2): 325 mg via ORAL

## 2022-11-27 MED ORDER — OXYCODONE HCL 5 MG PO TABS: 10.0000 mg | ORAL_TABLET | ORAL | Status: DC | PRN

## 2022-11-27 MED ORDER — FENTANYL CITRATE (PF) 100 MCG/2ML IJ SOLN: 25.0000 ug | INTRAMUSCULAR | Status: DC | PRN

## 2022-11-27 MED ORDER — INSULIN ASPART 100 UNIT/ML IJ SOLN
0.0000 [IU] | Freq: Every day | INTRAMUSCULAR | Status: DC
  Administered 2022-11-27: 2 [IU] via SUBCUTANEOUS

## 2022-11-27 MED ORDER — SODIUM CHLORIDE 0.9 % IV SOLN
INTRAVENOUS | Status: DC
Start: 2022-11-27 — End: 2022-11-28

## 2022-11-27 MED ORDER — VANCOMYCIN HCL IN DEXTROSE 1-5 GM/200ML-% IV SOLN
INTRAVENOUS | Status: AC
  Filled 2022-11-27: qty 200

## 2022-11-27 SURGICAL SUPPLY — 64 items
ATTUNE MED DOME PAT 38 KNEE (Knees) IMPLANT
ATTUNE PS FEM LT SZ 5 CEM KNEE (Femur) IMPLANT
ATTUNE PSRP INSR SZ5 5 KNEE (Insert) IMPLANT
BASE TIBIA ATTUNE KNEE SYS SZ6 (Knees) IMPLANT
BATTERY INSTRU NAVIGATION (MISCELLANEOUS) ×4 IMPLANT
BIT DRILL QUICK REL 1/8 2PK SL (BIT) ×1 IMPLANT
BLADE SAW 70X12.5 (BLADE) ×1 IMPLANT
BLADE SAW 90X13X1.19 OSCILLAT (BLADE) ×1 IMPLANT
BLADE SAW 90X25X1.19 OSCILLAT (BLADE) ×1 IMPLANT
BONE CEMENT GENTAMICIN (Cement) ×2 IMPLANT
BRUSH SCRUB EZ PLAIN DRY (MISCELLANEOUS) ×1 IMPLANT
CEMENT BONE GENTAMICIN 40 (Cement) IMPLANT
COOLER POLAR GLACIER W/PUMP (MISCELLANEOUS) ×1 IMPLANT
CUFF TRNQT CYL 24X4X16.5-23 (TOURNIQUET CUFF) IMPLANT
DRAPE SHEET LG 3/4 BI-LAMINATE (DRAPES) ×1 IMPLANT
DRSG AQUACEL AG ADV 3.5X14 (GAUZE/BANDAGES/DRESSINGS) ×1 IMPLANT
DRSG MEPILEX SACRM 8.7X9.8 (GAUZE/BANDAGES/DRESSINGS) ×1 IMPLANT
DRSG TEGADERM 4X4.75 (GAUZE/BANDAGES/DRESSINGS) ×1 IMPLANT
DURAPREP 26ML APPLICATOR (WOUND CARE) ×2 IMPLANT
ELECT CAUTERY BLADE 6.4 (BLADE) ×1 IMPLANT
ELECT REM PT RETURN 9FT ADLT (ELECTROSURGICAL) ×1
ELECTRODE REM PT RTRN 9FT ADLT (ELECTROSURGICAL) ×1 IMPLANT
EVACUATOR 1/8 PVC DRAIN (DRAIN) ×1 IMPLANT
EX-PIN ORTHOLOCK NAV 4X150 (PIN) ×2 IMPLANT
GAUZE XEROFORM 1X8 LF (GAUZE/BANDAGES/DRESSINGS) ×1 IMPLANT
GLOVE BIOGEL M STRL SZ7.5 (GLOVE) ×6 IMPLANT
GLOVE SURG UNDER POLY LF SZ8 (GLOVE) ×2 IMPLANT
GOWN STRL REUS W/ TWL LRG LVL3 (GOWN DISPOSABLE) ×1 IMPLANT
GOWN STRL REUS W/ TWL XL LVL3 (GOWN DISPOSABLE) ×1 IMPLANT
GOWN TOGA ZIPPER T7+ PEEL AWAY (MISCELLANEOUS) ×1 IMPLANT
HOLDER FOLEY CATH W/STRAP (MISCELLANEOUS) ×1 IMPLANT
HOOD PEEL AWAY T7 (MISCELLANEOUS) ×1 IMPLANT
IV NS IRRIG 3000ML ARTHROMATIC (IV SOLUTION) ×1 IMPLANT
KIT TURNOVER KIT A (KITS) ×1 IMPLANT
KNIFE SCULPS 14X20 (INSTRUMENTS) ×1 IMPLANT
MANIFOLD NEPTUNE II (INSTRUMENTS) ×2 IMPLANT
NDL SPNL 20GX3.5 QUINCKE YW (NEEDLE) ×2 IMPLANT
NEEDLE SPNL 20GX3.5 QUINCKE YW (NEEDLE) ×2 IMPLANT
PACK TOTAL KNEE (MISCELLANEOUS) ×1 IMPLANT
PAD ABD DERMACEA PRESS 5X9 (GAUZE/BANDAGES/DRESSINGS) ×2 IMPLANT
PAD ARMBOARD 7.5X6 YLW CONV (MISCELLANEOUS) ×3 IMPLANT
PAD WRAPON POLAR KNEE (MISCELLANEOUS) ×1 IMPLANT
PENCIL SMOKE EVACUATOR COATED (MISCELLANEOUS) ×1 IMPLANT
PIN DRILL FIX HALF THREAD (BIT) ×2 IMPLANT
PIN FIXATION 1/8DIA X 3INL (PIN) ×1 IMPLANT
PULSAVAC PLUS IRRIG FAN TIP (DISPOSABLE) ×1
SOLUTION IRRIG SURGIPHOR (IV SOLUTION) ×1 IMPLANT
SPONGE DRAIN TRACH 4X4 STRL 2S (GAUZE/BANDAGES/DRESSINGS) ×1 IMPLANT
STAPLER SKIN PROX 35W (STAPLE) ×1 IMPLANT
STOCKINETTE STRL BIAS CUT 8X4 (MISCELLANEOUS) ×1 IMPLANT
STRAP TIBIA SHORT (MISCELLANEOUS) ×1 IMPLANT
SUCTION TUBE FRAZIER 10FR DISP (SUCTIONS) ×1 IMPLANT
SUT VIC AB 0 CT1 36 (SUTURE) ×1 IMPLANT
SUT VIC AB 1 CT1 36 (SUTURE) ×2 IMPLANT
SUT VIC AB 2-0 CT2 27 (SUTURE) ×1 IMPLANT
SYR 30ML LL (SYRINGE) ×2 IMPLANT
TIBIA ATTUNE KNEE SYS BASE SZ6 (Knees) ×1 IMPLANT
TIP FAN IRRIG PULSAVAC PLUS (DISPOSABLE) ×1 IMPLANT
TOWEL OR 17X26 4PK STRL BLUE (TOWEL DISPOSABLE) ×1 IMPLANT
TOWER CARTRIDGE SMART MIX (DISPOSABLE) ×1 IMPLANT
TRAP FLUID SMOKE EVACUATOR (MISCELLANEOUS) ×1 IMPLANT
TRAY FOLEY MTR SLVR 16FR STAT (SET/KITS/TRAYS/PACK) ×1 IMPLANT
WATER STERILE IRR 1000ML POUR (IV SOLUTION) ×1 IMPLANT
WRAPON POLAR PAD KNEE (MISCELLANEOUS) ×1

## 2022-11-27 NOTE — Discharge Summary (Signed)
Physician Discharge Summary  Subjective: Day of Surgery Procedure(s) (LRB): COMPUTER ASSISTED TOTAL KNEE ARTHROPLASTY (Left) Patient reports pain as mild.   Patient seen in rounds with Dr. Ernest Pine. Patient is well, and has had no acute complaints or problems.  Denies any CP, SOB, N/B, fevers or chills. We will continue with therapy today Patient is ready to go home  Physician Discharge Summary  Patient ID: Madison Cuevas MRN: [id number] DOB/AGE: [id number] y.o.  Admit date: 11/27/2022 Discharge date: 11/27/2022  Admission Diagnoses:  Discharge Diagnoses:  Principal Problem:   History of total knee arthroplasty, left   Discharged Condition: good  Hospital Course: Patient presented to the hospital on 11/27/2022 for an elective left total knee arthroplasty performed by Dr. Ernest Pine.  The patient was given 1 g of TXA, 2 g of Ancef and vancomycin prior to her procedure for MRSA coverage.  The patient tolerated the procedure well without any complications.  See procedural note below for details.  Postoperatively, the patient did well.  She was able to pass her PT protocols on postop day 1 without any issues.  She was able to void her bladder without any issues.  Her JP drain was pulled without difficulty, intact.  She denies having any issues including any CP, SOB, N/B, fevers or chills.  Her vital signs are stable.  Patient is stable for discharge home.  ***  Of note patient does have a previous history of DVT and PE for which she is being placed on Eliquis twice daily  Treatments: Vancomycin perioperatively  Discharge Exam: Blood pressure 139/87, pulse (!) 108, temperature 97.9 F (36.6 C), temperature source Temporal, resp. rate 16, SpO2 97%.   Disposition: home   Allergies as of 11/27/2022       Reactions   Trulicity [dulaglutide] Nausea Only   Adhesive [tape] Rash   With extended use     Med Rec must be completed prior to using this [facility]***       Follow-up  Information     Rayburn Go, PA-C Follow up on 12/12/2022.   Specialty: Orthopedic Surgery Why: at 1:15pm Contact information: [address] [place] [address] [phone]         Donato Heinz, MD Follow up on 01/14/2023.   Specialty: Orthopedic Surgery Why: at 9:30am Contact information: [address] [facility] [place] [address] [phone]                 Signed: Gean Birchwood 11/27/2022, 2:58 PM   Objective: Vital signs in last 24 hours: Temp:  [97.9 F (36.6 C)] 97.9 F (36.6 C) (11/20 1039) Pulse Rate:  [108] 108 (11/20 1039) Resp:  [16] 16 (11/20 1039) BP: (139)/(87) 139/87 (11/20 1039) SpO2:  [97 %] 97 % (11/20 1039)  Intake/Output from previous day:  Intake/Output Summary (Last 24 hours) at 11/27/2022 1458 Last data filed at 11/27/2022 1451 Gross per 24 hour  Intake 1150 ml  Output 550 ml  Net 600 ml    Intake/Output this shift: Total I/O In: 1150 [I.V.:850; IV Piggyback:300] Out: 550 [Urine:500; Blood:50]  Labs: No results for input(s): "HGB" in the last 72 hours. No results for input(s): "WBC", "RBC", "HCT", "PLT" in the last 72 hours. No results for input(s): "NA", "K", "CL", "CO2", "BUN", "CREATININE", "GLUCOSE", "CALCIUM" in the last 72 hours. No results for input(s): "LABPT", "INR" in the last 72 hours.  EXAM: General - Patient is Alert and Appropriate and oriented Extremity - Neurologically intact ABD  soft Neurovascular intact Sensation intact distally Intact pulses distally Dorsiflexion/Plantar flexion intact No cellulitis present Compartment soft Dressing - dressing C/D/I and no drainage Motor Function - intact, moving foot and toes well on exam. Able to plantar and dorsiflex with good strength and range of motion.  Neurovascularly intact all dermatomes down her left lower extremity.  Posterior tibial pulses appreciated. JP Drain pulled without difficulty.  Intact  Assessment/Plan: Day of Surgery Procedure(s) (LRB): COMPUTER ASSISTED TOTAL KNEE ARTHROPLASTY (Left) Procedure(s) (LRB): COMPUTER ASSISTED TOTAL KNEE ARTHROPLASTY (Left) Past Medical History:  Diagnosis Date   Acute metabolic encephalopathy 10/28/2021   a.) initally felt to be TIA (ruled out by neurology); symptoms related to medication mediated metabolic encephalopathy caused by meclizine per neurology   Aortic atherosclerosis (HCC)    BPPV (benign paroxysmal positional vertigo)    Carotid atherosclerosis, bilateral    Coronary artery calcification seen on CT scan    DDD (degenerative disc disease), lumbar    Depression    Diabetic neuropathy (HCC)    Diastolic dysfunction    a.) TTE 06/27/2020: EF 60-65%, no RWMAs, G1DD, triv MR, mild-mod MS (MPG 6 mmHg(, mild AoV calc with mild-mod sclerosis (no stenosis); b.) TEE 06/29/2020: EF 60-65%, no LAA thrombus, mild MR, triv AR, no IAS; c.) TTE 10/29/2021: EF 65-70%, no RWMAs, mild MR, triv AR   Diverticulosis    DM (diabetes mellitus), type 2 (HCC)    DVT (deep venous thrombosis) (HCC) 07/2020   GERD (gastroesophageal reflux disease)    Herpes zoster    History of bilateral cataract extraction    History of chicken pox    Hyperlipidemia    Hypertension    Long term current use of aspirin    Mitral stenosis 06/27/2020   a.) TTE 06/27/2020: mild-mod MS (MPG 6.0 mmHg)   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   Weight as of 11/20/22: 89.3 kg.  Patient will continue to work with home health physical therapy on gait, ROM and strengthening of left lower extremity   Discussed with the patient continuing to utilize Polar Care   Patient will use bone foam in 20-30 minute intervals   Patient will wear TED hose bilaterally to help prevent DVT and clot formation   Discussed the Aquacel bandage.  This bandage will stay in place 7 days postoperatively.  Can be replaced with honeycomb bandages that will be sent home with the patient   Discussed sending the patient home with tramadol and oxycodone for as needed pain management.  Patient will also be sent home with Celebrex to help with swelling and inflammation.  Patient will take an Eliquis 2.5 mg twice daily for DVT prophylaxis   JP drain removed without difficulty, intact   Weight-Bearing as tolerated to left leg   Patient will follow-up with [facility] orthopedics in 2 weeks for staple removal and reevaluation  Diet - Regular diet Follow up - in 2 weeks Activity - WBAT Disposition - Home Condition Upon Discharge - Good DVT Prophylaxis -  Eliquis and TED hose  Danise Edge, PA-C Orthopaedic Surgery 11/27/2022, 2:58 PM

## 2022-11-27 NOTE — H&P (Signed)
ORTHOPAEDIC HISTORY & PHYSICAL Latanya Maudlin, PA - 11/19/2022 1:45 PM EST Formatting of this note is different from the original. Images from the original note were not included. Chief Complaint Chief Complaint Patient presents with Knee Pain H & P LEFT KNEE  Reason for Visit Madison Cuevas is a 77 y.o. who presents today for a history and physical. She is to undergo a left total knee arthroplasty on 11/27/2022. Since her last visit at the clinic there has been no improvement in her condition. Patient expresses her desire to proceed with surgery. Patient did fall approximately 3 weeks ago and is concerned about a "knot" to the right knee. She is status post right total knee arthroplasty performed approximately 7 years ago by Dr. Erin Sons.  She reports a several year history of left knee pain. She localizes most of the pain along the lateral aspect of the knee. She reports some swelling, no locking, and some giving way of the knee. The pain is aggravated by any weight bearing, going up and down stairs, and walking. The knee pain limits the patient's ability to ambulate long distances.The patient has not appreciated any significant improvement despite activity modification, intraarticular corticosteroid injections and viscosupplementation. She is not using any ambulatory aids. The patient states that the knee pain has progressed to the point that it is significantly interfering with her activities of daily living.  Past Medical History Past Medical History: Diagnosis Date Diabetic peripheral neuropathy (CMS/HHS-HCC) Diverticulosis 08/21/2015 Herpes zoster History of chickenpox Hypertension Osteoarthrosis, unspecified whether generalized or localized, lower leg 05/28/2013 Other and unspecified hyperlipidemia 05/28/2013 Type II or unspecified type diabetes mellitus without mention of complication, uncontrolled 05/28/2013  Past Surgical History Past Surgical History: Procedure  Laterality Date COLONOSCOPY 04/16/2010 11/24/2006. Tubular adenoma, diverticulosis. EGD 01/21/2011 no repeat per [facility] Right total knee arthroplasty 04/27/2011 Dr. Erin Sons COLONOSCOPY 08/21/2015 Diverticulosis/PHx colon polyps/Repeat 72yrs/MUS COLONOSCOPY 07/20/2019 Diverticulosis/Negative colon bx/PHx CP No repeat EGD 06/21/2020 Gastritis/No repeat/TKT CATARACT EXTRACTION Left 09/16/2021 root canal 2024 APPENDECTOMY BACK SURGERY Back surgery x2. TONSILLECTOMY TUBAL LIGATION  Past Family History Family History Problem Relation Age of Onset Heart disease Mother MI Alcohol abuse Father Cirrhosis Father Hyperlipidemia (Elevated cholesterol) Sister Diabetes Maternal Grandmother Diabetes Paternal Grandmother  Medications Current Outpatient Medications Medication Sig Dispense Refill ACCU-CHEK GUIDE TEST STRIPS test strip ONCE DAILY AS INSTRUCTED. 100 strip 3 amoxicillin (AMOXIL) 500 MG capsule Take 4 capsules by mouth once 1 hour prior to Dental Appt aspirin 81 MG EC tablet Take 1 tablet (81 mg total) by mouth every other day blood glucose meter kit as directed 1 each 1 buPROPion (WELLBUTRIN XL) 150 MG XL tablet Take 1 tablet (150 mg total) by mouth once daily 90 tablet 1 carboxymethylcellulose (REFRESH TEARS) 0.5 % ophthalmic solution Place 1 drop into both eyes 3 (three) times daily as needed for Dry Eyes glipiZIDE (GLUCOTROL XL) 10 MG XL tablet Take 1 tablet (10 mg total) by mouth daily with breakfast (Patient taking differently: Take 10 mg by mouth daily with breakfast And 5 mg QHS) 30 tablet 11 glipiZIDE (GLUCOTROL XL) 5 MG XL tablet Take 1 tablet (5 mg total) by mouth once daily 30 tablet 11 Herbal Supplement 2 capsules once daily Herbal Name: Vital hair complex multivitamin lancets (ACCU-CHEK SOFTCLIX LANCETS) ONCE DAILY USE AS INSTRUCTED. 100 each 3 losartan (COZAAR) 25 MG tablet take 1 tablet by mouth every day 90 tablet 1 metFORMIN (GLUCOPHAGE-XR) 500 MG XR  tablet TAKE 1 TABLET BY MOUTH TWICE A DAY (Patient taking  differently: Take 1,000 mg by mouth 2 (two) times daily 2 tabs BID) 180 tablet 1 pantoprazole (PROTONIX) 40 MG DR tablet take 1 tablet by mouth every day 90 tablet 1 progesterone (PROMETRIUM) 100 MG capsule TAKE 1 CAPSULE BY MOUTH ONCE DAILY. 90 capsule 1 simvastatin (ZOCOR) 40 MG tablet take 1 tablet by mouth every day at night 90 tablet 3 tolterodine (DETROL) 2 MG tablet take 1 tablet by mouth 2 times daily. 180 tablet 1 triamcinolone (NASACORT AQ) 55 mcg nasal spray Place 2 sprays into both nostrils at bedtime venlafaxine (EFFEXOR-XR) 75 MG XR capsule take 1 capsule by mouth every day 90 capsule 1  No current facility-administered medications for this visit.  Allergies Allergies Allergen Reactions Dulaglutide Nausea   Review of Systems A comprehensive 14 point ROS was performed, reviewed, and the pertinent orthopaedic findings are documented in the HPI.  Exam BP 122/80 (BP Location: Left upper arm, Patient Position: Sitting, BP Cuff Size: Adult)  Ht 170.2 cm (5\' 7" )  Wt 88.6 kg (195 lb 6.4 oz)  BMI 30.60 kg/m  General: Well-developed well-nourished female seen in no acute distress.  HEENT: Atraumatic,normocephalic. Pupils are equal and reactive to light. Oropharynx is clear with moist mucosa  Lungs: Clear to auscultation bilaterally  Cardiovascular: Regular rate and rhythm. Normal S1, S2. No murmurs. No appreciable gallops or rubs. Peripheral pulses are palpable.  Abdomen: Soft, non-tender, nondistended. Bowel sounds present  Extremity: Left Knee: Soft tissue swelling: minimal Effusion: none Erythema: none Crepitance: mild Tenderness: lateral Alignment: relative valgus Mediolateral laxity: lateral pseudolaxity Posterior sag: negative Patellar tracking: Good tracking without evidence of subluxation or tilt Atrophy: No significant atrophy. Quadriceps tone was fair to good. Range of motion: 0/12/115  degrees  Neurological:  The patient is alert and oriented Sensation to light touch appears to be intact and within normal limits Gross motor strength appeared to be equal to 5/5  Vascular :  Peripheral pulses felt to be palpable. Capillary refill appears to be intact and within normal limits  X-ray  1. AP standing, lateral and sunrise view of the left knee ordered and interpreted on today's visit shows near bone-on-bone to both the medial and lateral compartment with the lateral compartment being more pronounced. She is noted to have osteophytes in a tricompartmental fashion. Mild subchondral sclerosis noted. There is significant degenerative changes to the patellofemoral articulation. The patella appears to be tracking well. 2. AP standing, lateral and sunrise view of the right knee ordered and interpreted on today's visit shows a total knee arthroplasty with good alignment position. There is no evidence of any loosening of the prosthesis. No acute bony abnormalities is noted. Does appear to have just a little bit of increased fluid in the suprapatellar region.  Impression  1. Degenerative arthrosis left knee  Plan  1. Patient's medication was gone over on today's visit 2. Past medical history reviewed 3. Postop rehab course discussed 4. Return to clinic 2 weeks postop. Sooner if any problems  This note was generated in part with voice recognition software and I apologize for any typographical errors that were not detected and corrected   Tera Partridge PA Electronically signed by Latanya Maudlin, PA at 11/25/2022 7:49 AM EST

## 2022-11-27 NOTE — Op Note (Signed)
OPERATIVE NOTE  DATE OF SURGERY:  11/27/2022  PATIENT NAME:  Madison Cuevas   DOB: Aug 31, 1945  MRN: [id number]  PRE-OPERATIVE DIAGNOSIS: Degenerative arthrosis of the left knee, primary  POST-OPERATIVE DIAGNOSIS:  Same  PROCEDURE:  Left total knee arthroplasty using computer-assisted navigation  SURGEON:  Jena Gauss. M.D.  ASSISTANT:  Gean Birchwood, PA-C (present and scrubbed throughout the case, critical for assistance with exposure, retraction, instrumentation, and closure)  ANESTHESIA: spinal  ESTIMATED BLOOD LOSS: 50 mL  FLUIDS REPLACED: 850 mL of crystalloid  TOURNIQUET TIME: 68 minutes  DRAINS: 2 medium Hemovac drains  SOFT TISSUE RELEASES: Anterior cruciate ligament, posterior cruciate ligament, deep medial collateral ligament, patellofemoral ligament  IMPLANTS UTILIZED: DePuy Attune size 5 posterior stabilized femoral component (cemented), size 6 rotating platform tibial component (cemented), 38 mm medialized dome patella (cemented), and a 5 mm stabilized rotating platform polyethylene insert.  INDICATIONS FOR SURGERY: Madison Cuevas is a 77 y.o. year old female with a long history of progressive knee pain. X-rays demonstrated severe degenerative changes in tricompartmental fashion. The patient had not seen any significant improvement despite conservative nonsurgical intervention. After discussion of the risks and benefits of surgical intervention, the patient expressed understanding of the risks benefits and agree with plans for total knee arthroplasty.   The risks, benefits, and alternatives were discussed at length including but not limited to the risks of infection, bleeding, nerve injury, stiffness, blood clots, the need for revision surgery, cardiopulmonary complications, among others, and they were willing to proceed.  PROCEDURE IN DETAIL: The patient was brought into the operating room and, after adequate spinal anesthesia was achieved, a tourniquet was  placed on the patient's upper thigh. The patient's knee and leg were cleaned and prepped with alcohol and DuraPrep and draped in the usual sterile fashion. A "timeout" was performed as per usual protocol. The lower extremity was exsanguinated using an Esmarch, and the tourniquet was inflated to 300 mmHg. An anterior longitudinal incision was made followed by a standard mid vastus approach. The deep fibers of the medial collateral ligament were elevated in a subperiosteal fashion off of the medial flare of the tibia so as to maintain a continuous soft tissue sleeve. The patella was subluxed laterally and the patellofemoral ligament was incised. Inspection of the knee demonstrated severe degenerative changes with full-thickness loss of articular cartilage. Osteophytes were debrided using a rongeur. Anterior and posterior cruciate ligaments were excised. Two 4.0 mm Schanz pins were inserted in the femur and into the tibia for attachment of the array of trackers used for computer-assisted navigation. Hip center was identified using a circumduction technique. Distal landmarks were mapped using the computer. The distal femur and proximal tibia were mapped using the computer. The distal femoral cutting guide was positioned using computer-assisted navigation so as to achieve a 5 distal valgus cut. The femur was sized and it was felt that a size 5 femoral component was appropriate. A size 5 femoral cutting guide was positioned and the anterior cut was performed and verified using the computer. This was followed by completion of the posterior and chamfer cuts. Femoral cutting guide for the central box was then positioned in the center box cut was performed.  Attention was then directed to the proximal tibia. Medial and lateral menisci were excised. The extramedullary tibial cutting guide was positioned using computer-assisted navigation so as to achieve a 0 varus-valgus alignment and 3 posterior slope. The cut was  performed and verified using the computer. The proximal tibia  was sized and it was felt that a size 6 tibial tray was appropriate. Tibial and femoral trials were inserted followed by insertion of a 5 mm polyethylene insert. This allowed for excellent mediolateral soft tissue balancing both in flexion and in full extension. Finally, the patella was cut and prepared so as to accommodate a 38 mm medialized dome patella. A patella trial was placed and the knee was placed through a range of motion with excellent patellar tracking appreciated. The femoral trial was removed after debridement of posterior osteophytes. The central post-hole for the tibial component was reamed followed by insertion of a keel punch. Tibial trials were then removed. Cut surfaces of bone were irrigated with copious amounts of normal saline using pulsatile lavage and then suctioned dry. Polymethylmethacrylate cement with gentamicin was prepared in the usual fashion using a vacuum mixer. Cement was applied to the cut surface of the proximal tibia as well as along the undersurface of a size 6 rotating platform tibial component. Tibial component was positioned and impacted into place. Excess cement was removed using Personal assistant. Cement was then applied to the cut surfaces of the femur as well as along the posterior flanges of the size 5 femoral component. The femoral component was positioned and impacted into place. Excess cement was removed using Personal assistant. A 5 mm polyethylene trial was inserted and the knee was brought into full extension with steady axial compression applied. Finally, cement was applied to the backside of a 38 mm medialized dome patella and the patellar component was positioned and patellar clamp applied. Excess cement was removed using Personal assistant. After adequate curing of the cement, the tourniquet was deflated after a total tourniquet time of 68 minutes. Hemostasis was achieved using electrocautery. The knee was  irrigated with copious amounts of normal saline using pulsatile lavage followed by 450 ml of Surgiphor and then suctioned dry. 20 mL of 1.3% Exparel and 60 mL of 0.25% Marcaine in 40 mL of normal saline was injected along the posterior capsule, medial and lateral gutters, and along the arthrotomy site. A 5 mm stabilized rotating platform polyethylene insert was inserted and the knee was placed through a range of motion with excellent mediolateral soft tissue balancing appreciated and excellent patellar tracking noted. 2 medium drains were placed in the wound bed and brought out through separate stab incisions. The medial parapatellar portion of the incision was reapproximated using interrupted sutures of #1 Vicryl. Subcutaneous tissue was approximated in layers using first #0 Vicryl followed #2-0 Vicryl. The skin was approximated with skin staples. A sterile dressing was applied.  The patient tolerated the procedure well and was transported to the recovery room in stable condition.    Cassell Voorhies P. Angie Fava., M.D.

## 2022-11-27 NOTE — Transfer of Care (Signed)
Immediate Anesthesia Transfer of Care Note  Patient: Madison Cuevas  Procedure(s) Performed: COMPUTER ASSISTED TOTAL KNEE ARTHROPLASTY (Left: Knee)  Patient Location: PACU  Anesthesia Type:Spinal  Level of Consciousness: drowsy  Airway & Oxygen Therapy: Patient Spontanous Breathing and Patient connected to face mask oxygen  Post-op Assessment: Report given to RN and Post -op Vital signs reviewed and stable  Post vital signs: Reviewed and stable  Last Vitals:  Vitals Value Taken Time  BP 117/60 11/27/22 1505  Temp 35.9 1504  Pulse 93 11/27/22 1507  Resp 13 11/27/22 1507  SpO2 98 % 11/27/22 1507  Vitals shown include unfiled device data.  Last Pain:  Vitals:   11/27/22 1039  TempSrc: Temporal  PainSc: 0-No pain         Complications: No notable events documented.

## 2022-11-27 NOTE — Plan of Care (Signed)
continue

## 2022-11-27 NOTE — Progress Notes (Signed)
Subjective: Day of Surgery Procedure(s) (LRB): COMPUTER ASSISTED TOTAL KNEE ARTHROPLASTY (Left) Patient reports pain as mild.   Patient seen in rounds with Dr. Ernest Pine. Patient is well, and has had no acute complaints or problems.  Denies any CP, SOB, N/B, fevers or chills. We will start therapy today.  Plan is to go Home after hospital stay.  Objective: Vital signs in last 24 hours: Temp:  [97.9 F (36.6 C)] 97.9 F (36.6 C) (11/20 1039) Pulse Rate:  [108] 108 (11/20 1039) Resp:  [16] 16 (11/20 1039) BP: (139)/(87) 139/87 (11/20 1039) SpO2:  [97 %] 97 % (11/20 1039)  Intake/Output from previous day:  Intake/Output Summary (Last 24 hours) at 11/27/2022 1455 Last data filed at 11/27/2022 1451 Gross per 24 hour  Intake 1150 ml  Output 550 ml  Net 600 ml    Intake/Output this shift: Total I/O In: 1150 [I.V.:850; IV Piggyback:300] Out: 550 [Urine:500; Blood:50]  Labs: No results for input(s): "HGB" in the last 72 hours. No results for input(s): "WBC", "RBC", "HCT", "PLT" in the last 72 hours. No results for input(s): "NA", "K", "CL", "CO2", "BUN", "CREATININE", "GLUCOSE", "CALCIUM" in the last 72 hours. No results for input(s): "LABPT", "INR" in the last 72 hours.  EXAM General - Patient is Alert and Appropriate and oriented Extremity - Neurologically intact ABD soft Neurovascular intact Sensation intact distally Intact pulses distally Dorsiflexion/Plantar flexion intact No cellulitis present Compartment soft Dressing - dressing C/D/I and no drainage Motor Function - intact, moving foot and toes well on exam. Able to plantar and dorsiflex with good strength and range of motion.  Neurovascularly intact all dermatomes down her left lower extremity.  Posterior tibial pulses appreciated. JP Drain pulled without difficulty. Intact  Past Medical History:  Diagnosis Date   Acute metabolic encephalopathy 10/28/2021   a.) initally felt to be TIA (ruled out by neurology);  symptoms related to medication mediated metabolic encephalopathy caused by meclizine per neurology   Aortic atherosclerosis (HCC)    BPPV (benign paroxysmal positional vertigo)    Carotid atherosclerosis, bilateral    Coronary artery calcification seen on CT scan    DDD (degenerative disc disease), lumbar    Depression    Diabetic neuropathy (HCC)    Diastolic dysfunction    a.) TTE 06/27/2020: EF 60-65%, no RWMAs, G1DD, triv MR, mild-mod MS (MPG 6 mmHg(, mild AoV calc with mild-mod sclerosis (no stenosis); b.) TEE 06/29/2020: EF 60-65%, no LAA thrombus, mild MR, triv AR, no IAS; c.) TTE 10/29/2021: EF 65-70%, no RWMAs, mild MR, triv AR   Diverticulosis    DM (diabetes mellitus), type 2 (HCC)    DVT (deep venous thrombosis) (HCC) 07/2020   GERD (gastroesophageal reflux disease)    Herpes zoster    History of bilateral cataract extraction    History of chicken pox    Hyperlipidemia    Hypertension    Long term current use of aspirin    Mitral stenosis 06/27/2020   a.) TTE 06/27/2020: mild-mod MS (MPG 6.0 mmHg)   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 *  Estimated body mass index is 30.83 kg/m as calculated from the following:   Height as of 11/20/22: 5\' 7"  (1.702 m).   Weight as of 11/20/22: 89.3 kg. Advance diet Up with therapy  Patient will continue to work with physical therapy to pass postoperative PT protocols, ROM and  strengthening  Discussed with the patient continuing to utilize Polar Care  Patient will use bone foam in 20-30 minute intervals  Patient will wear TED hose bilaterally to help prevent DVT and clot formation  Discussed the Aquacel bandage.  This bandage will stay in place 7 days postoperatively.  Can be replaced with honeycomb bandages that will be sent home with the patient  Discussed sending the patient home with tramadol and oxycodone for as needed pain management.  Patient will also be sent home with Celebrex to help with swelling and inflammation.  Patient will take an Eliquis 2.5 mg twice daily for DVT prophylaxis  JP drain removed without difficulty, intact  Weight-Bearing as tolerated to left leg  Patient will follow-up with [facility] clinic orthopedics in 2 weeks for staple removal and reevaluation  Rayburn Go, PA-C [facility] 11/27/2022, 2:55 PM

## 2022-11-27 NOTE — Anesthesia Preprocedure Evaluation (Signed)
Anesthesia Evaluation  Patient identified by MRN, date of birth, ID band Patient awake    Reviewed: Allergy & Precautions, H&P , NPO status , Patient's Chart, lab work & pertinent test results, reviewed documented beta blocker date and time   Airway Mallampati: II   Neck ROM: full    Dental  (+) Poor Dentition   Pulmonary pneumonia, resolved, former smoker   Pulmonary exam normal        Cardiovascular Exercise Tolerance: Poor hypertension, On Medications + CAD  Normal cardiovascular exam Rhythm:regular Rate:Normal     Neuro/Psych  PSYCHIATRIC DISORDERS  Depression    TIA Neuromuscular disease    GI/Hepatic Neg liver ROS,GERD  Medicated,,  Endo/Other  negative endocrine ROSdiabetes, Well Controlled, Type 2    Renal/GU negative Renal ROS  negative genitourinary   Musculoskeletal   Abdominal   Peds  Hematology negative hematology ROS (+)   Anesthesia Other Findings Past Medical History: 10/28/2021: Acute metabolic encephalopathy     Comment:  a.) initally felt to be TIA (ruled out by neurology);               symptoms related to medication mediated metabolic               encephalopathy caused by meclizine per neurology No date: Aortic atherosclerosis (HCC) No date: BPPV (benign paroxysmal positional vertigo) No date: Carotid atherosclerosis, bilateral No date: Coronary artery calcification seen on CT scan No date: DDD (degenerative disc disease), lumbar No date: Depression No date: Diabetic neuropathy (HCC) No date: Diastolic dysfunction     Comment:  a.) TTE 06/27/2020: EF 60-65%, no RWMAs, G1DD, triv MR,               mild-mod MS (MPG 6 mmHg(, mild AoV calc with mild-mod               sclerosis (no stenosis); b.) TEE 06/29/2020: EF 60-65%,               no LAA thrombus, mild MR, triv AR, no IAS; c.) TTE               10/29/2021: EF 65-70%, no RWMAs, mild MR, triv AR No date: Diverticulosis No date: DM  (diabetes mellitus), type 2 (HCC) 07/2020: DVT (deep venous thrombosis) (HCC) No date: GERD (gastroesophageal reflux disease) No date: Herpes zoster No date: History of bilateral cataract extraction No date: History of chicken pox No date: Hyperlipidemia No date: Hypertension No date: Long term current use of aspirin 06/27/2020: Mitral stenosis     Comment:  a.) TTE 06/27/2020: mild-mod MS (MPG 6.0 mmHg) 11/20/2022: Nose colonized with MRSA     Comment:  a.) surgical PCR (+) 11/20/2022 prior to LEFT TKA No date: OAB (overactive bladder) No date: Osteoarthrosis 07/2020: PE (pulmonary thromboembolism) (HCC)     Comment:  a.) s/p Tx with 6 months DOAC therapy No date: Pneumonia No date: PVC's (premature ventricular contractions) 06/26/2020: Sepsis due to gram negative bacteria (Pasturella  multocida) No date: Small vessel disease, cerebrovascular No date: Wears partial dentures (lower) Past Surgical History: No date: APPENDECTOMY No date: BACK SURGERY     Comment:  lumbar x2 09/09/2018: CATARACT EXTRACTION W/PHACO; Right     Comment:  Procedure: CATARACT EXTRACTION PHACO AND INTRAOCULAR               LENS PLACEMENT (IOC)  RIGHT DIABETIC  01:01.5  16.2%  9.99;  Surgeon: Lockie Mola, MD;  Location:               [facility];  Service: Ophthalmology;                Laterality: Right;  Diabetic - oral meds 10/17/2021: CATARACT EXTRACTION W/PHACO; Left     Comment:  Procedure: CATARACT EXTRACTION PHACO AND INTRAOCULAR               LENS PLACEMENT (IOC) LEFT DIABETIC;  Surgeon: Lockie Mola, MD;  Location: [facility];  Service:               Ophthalmology;  Laterality: Left;                Diabetic 11.78 01:11.2 08/21/2015: COLONOSCOPY WITH PROPOFOL; N/A     Comment:  Procedure: COLONOSCOPY WITH PROPOFOL;  Surgeon: Christena Deem, MD;  Location: [facility] END[facility];  Service:               Endoscopy;   Laterality: N/A; 06/21/2020: ESOPHAGOGASTRODUODENOSCOPY (EGD) WITH PROPOFOL; N/A     Comment:  Procedure: ESOPHAGOGASTRODUODENOSCOPY (EGD) WITH               PROPOFOL;  Surgeon: Toledo, Boykin Nearing, MD;  Location:               [facility] ENDOSCOPY;  Service: Gastroenterology;  Laterality:               N/A; No date: JOINT REPLACEMENT; Right     Comment:  knee 06/29/2020: TEE WITHOUT CARDIOVERSION; N/A     Comment:  Procedure: TRANSESOPHAGEAL ECHOCARDIOGRAM (TEE);                Surgeon: Lamar Blinks, MD;  Location: [facility] ORS;                Service: Cardiovascular;  Laterality: N/A; No date: TONSILLECTOMY No date: TUBAL LIGATION   Reproductive/Obstetrics negative OB ROS                             Anesthesia Physical Anesthesia Plan  ASA: 3  Anesthesia Plan: Spinal   Post-op Pain Management:    Induction:   PONV Risk Score and Plan: 3  Airway Management Planned:   Additional Equipment:   Intra-op Plan:   Post-operative Plan:   Informed Consent: I have reviewed the patients History and Physical, chart, labs and discussed the procedure including the risks, benefits and alternatives for the proposed anesthesia with the patient or authorized representative who has indicated his/her understanding and acceptance.     Dental Advisory Given  Plan Discussed with: CRNA  Anesthesia Plan Comments:        Anesthesia Quick Evaluation

## 2022-11-27 NOTE — Interval H&P Note (Signed)
History and Physical Interval Note:  11/27/2022 11:14 AM  Madison Cuevas  has presented today for surgery, with the diagnosis of PRIMARY OSTEOARTHRITIS OF LEFT KNEE..  The various methods of treatment have been discussed with the patient and family. After consideration of risks, benefits and other options for treatment, the patient has consented to  Procedure(s): COMPUTER ASSISTED TOTAL KNEE ARTHROPLASTY (Left) as a surgical intervention.  The patient's history has been reviewed, patient examined, no change in status, stable for surgery.  I have reviewed the patient's chart and labs.  Questions were answered to the patient's satisfaction.     Brynna Dobos P Darren Nodal

## 2022-11-27 NOTE — Progress Notes (Signed)
Patient is not able to walk the distance required to go the bathroom, or he/she is unable to safely negotiate stairs required to access the bathroom.  A 3in1 BSC will alleviate this problem   Liliann File P. Brighton Pilley, Jr. M.D.  

## 2022-11-27 NOTE — Anesthesia Procedure Notes (Signed)
Spinal  Patient location during procedure: OR Start time: 11/27/2022 11:50 AM End time: 11/27/2022 12:06 PM Reason for block: surgical anesthesia Staffing Performed: resident/CRNA  Resident/CRNA: Morene Crocker, CRNA Performed by: Morene Crocker, CRNA Authorized by: Yevette Edwards, MD   Preanesthetic Checklist Completed: patient identified, IV checked, site marked, risks and benefits discussed, surgical consent, monitors and equipment checked, pre-op evaluation and timeout performed Spinal Block Patient position: sitting Prep: ChloraPrep Patient monitoring: heart rate, continuous pulse ox and blood pressure Approach: left paramedian Location: L4-5 Injection technique: single-shot Needle Needle type: Whitacre  Needle gauge: 22 G Needle length: 9 cm Assessment Sensory level: T10 Events: CSF return Additional Notes I was unsuccessful on two attempts midline at L4-5 using needle in the tray, kept on hitting OS. Dr. Pernell Dupre was successful on first attempt at same level utilizing a para-median approach with a 22 G needle.  Negative paresthesia. Negative blood return. Positive free-flowing CSF. Expiration date of kit checked and confirmed.  Patient tolerated procedure well, without complications.

## 2022-11-28 ENCOUNTER — Encounter: Payer: Self-pay | Admitting: Orthopedic Surgery

## 2022-11-28 DIAGNOSIS — M1712 Unilateral primary osteoarthritis, left knee: Secondary | ICD-10-CM | POA: Diagnosis not present

## 2022-11-28 LAB — GLUCOSE, CAPILLARY: Glucose-Capillary: 189 mg/dL — ABNORMAL HIGH (ref 70–99)

## 2022-11-28 MED ORDER — METOCLOPRAMIDE HCL 10 MG PO TABS
ORAL_TABLET | ORAL | Status: AC
  Filled 2022-11-28: qty 1

## 2022-11-28 MED ORDER — PANTOPRAZOLE SODIUM 40 MG PO TBEC
DELAYED_RELEASE_TABLET | ORAL | Status: AC
  Filled 2022-11-28: qty 1

## 2022-11-28 MED ORDER — MAGNESIUM HYDROXIDE 400 MG/5ML PO SUSP
ORAL | Status: AC
  Filled 2022-11-28: qty 30

## 2022-11-28 MED ORDER — CELECOXIB 200 MG PO CAPS: 200.0000 mg | ORAL_CAPSULE | Freq: Two times a day (BID) | ORAL | 1 refills | Status: AC

## 2022-11-28 MED ORDER — OXYCODONE HCL 5 MG PO TABS: 5.0000 mg | ORAL_TABLET | ORAL | 0 refills | Status: AC | PRN

## 2022-11-28 MED ORDER — SENNOSIDES-DOCUSATE SODIUM 8.6-50 MG PO TABS
ORAL_TABLET | ORAL | Status: AC
  Filled 2022-11-28: qty 1

## 2022-11-28 MED ORDER — APIXABAN 2.5 MG PO TABS
ORAL_TABLET | ORAL | Status: AC
  Filled 2022-11-28: qty 1

## 2022-11-28 MED ORDER — METFORMIN HCL ER 500 MG PO TB24
ORAL_TABLET | ORAL | Status: AC
  Filled 2022-11-28: qty 2

## 2022-11-28 MED ORDER — CELECOXIB 200 MG PO CAPS
ORAL_CAPSULE | ORAL | Status: AC
  Filled 2022-11-28: qty 1

## 2022-11-28 MED ORDER — ACETAMINOPHEN 10 MG/ML IV SOLN
INTRAVENOUS | Status: AC
Start: 2022-11-28 — End: ?
  Filled 2022-11-28: qty 100

## 2022-11-28 MED ORDER — ACETAMINOPHEN 10 MG/ML IV SOLN
INTRAVENOUS | Status: AC
  Filled 2022-11-28: qty 100

## 2022-11-28 MED ORDER — APIXABAN 2.5 MG PO TABS: 2.5000 mg | ORAL_TABLET | Freq: Two times a day (BID) | ORAL | 1 refills | Status: DC

## 2022-11-28 MED ORDER — CEFAZOLIN SODIUM-DEXTROSE 2-4 GM/100ML-% IV SOLN
INTRAVENOUS | Status: AC
  Filled 2022-11-28: qty 100

## 2022-11-28 MED ORDER — INSULIN ASPART 100 UNIT/ML IJ SOLN
INTRAMUSCULAR | Status: AC
  Filled 2022-11-28: qty 1

## 2022-11-28 MED ORDER — FERROUS SULFATE 325 (65 FE) MG PO TABS
ORAL_TABLET | ORAL | Status: AC
  Filled 2022-11-28: qty 1

## 2022-11-28 MED ORDER — TRAMADOL HCL 50 MG PO TABS: 50.0000 mg | ORAL_TABLET | ORAL | 0 refills | Status: AC | PRN

## 2022-11-28 NOTE — Evaluation (Addendum)
Physical Therapy Evaluation Patient Details Name: Madison Cuevas MRN: [id number] DOB: Mar 31, 1945 Today's Date: 11/28/2022  History of Present Illness  77yo female s/p L TKA on 11/27/22. PMHx includes R TKA, PE/DVT (6/22), OAB, DM2, HTN, HLD, GERD, and vertigo.  Clinical Impression  Pt was pleasant and motivated to participate during the session and put forth good effort throughout. Pt was received in chair, A,O x4 and gave detailed history. Pt was able to complete transfers with CGA and cuing for safety for increased efficiency. Pt ambulated with RW and Superv, exhibiting a smooth and capable gait pattern with practice. Pt was able to complete stair training with CGA and showed good comprehension of proper sequencing for safety. Pt was educated on safe transfers/mobility and HEP, exhibiting good understanding with all. Pt vitals were monitored and remained WNL throughout session, no reported adverse symptoms other than minimal pain with mobility. Pt will benefit from continued PT services upon discharge to safely address deficits listed in patient problem list for decreased caregiver assistance and eventual return to PLOF.       If plan is discharge home, recommend the following: A little help with walking and/or transfers;Help with stairs or ramp for entrance;Assist for transportation;Assistance with cooking/housework;A little help with bathing/dressing/bathroom   Can travel by private vehicle        Equipment Recommendations    Recommendations for Other Services       Functional Status Assessment Patient has had a recent decline in their functional status and demonstrates the ability to make significant improvements in function in a reasonable and predictable amount of time.     Precautions / Restrictions Precautions Precautions: Fall;Knee Precaution Booklet Issued: Yes (comment) Precaution Comments: no KI required Restrictions Weight Bearing Restrictions: Yes LLE Weight Bearing:  Weight bearing as tolerated      Mobility  Bed Mobility               General bed mobility comments: n/a found in chair    Transfers Overall transfer level: Needs assistance Equipment used: Rolling walker (2 wheels) Transfers: Sit to/from Stand Sit to Stand: Contact guard assist           General transfer comment: good use of BUE pushoff, able to weightshift well over LLE, cuing for LLE placement and use of BLEs    Ambulation/Gait Ambulation/Gait assistance: Supervision Gait Distance (Feet): 200 Feet Assistive device: Rolling walker (2 wheels) Gait Pattern/deviations: Step-through pattern, Decreased step length - right, Decreased step length - left, Decreased stride length Gait velocity: decreased     General Gait Details: initial slow cadence showing improvement, good ability to shift weight over LLE with cuing, mild unsteadiness that progressed into confident reciprocal pattern with increased bilat step lengths, cuing for sequencing and RW management/turns  Stairs Stairs: Yes Stairs assistance: Contact guard assist Stair Management: One rail Left, Step to pattern Number of Stairs: 4 General stair comments: exhibited good recall of sequencing education, good eccentric control of RLE, able to ascend/descend slowly but confidently, cuing for sequencing, hand/feet placement  Wheelchair Mobility     Tilt Bed    Modified Rankin (Stroke Patients Only)       Balance Overall balance assessment: Needs assistance Sitting-balance support: No upper extremity supported, Feet supported Sitting balance-Leahy Scale: Good     Standing balance support: Bilateral upper extremity supported, During functional activity Standing balance-Leahy Scale: Fair Standing balance comment: steady in static standing, mild unsteadiness with dynamic standing activities with RW  Pertinent Vitals/Pain Pain Assessment Pain Assessment: No/denies  pain Pain Location: L knee with mobility Pain Descriptors / Indicators: Aching Pain Intervention(s): Monitored during session, Ice applied    Home Living Family/patient expects to be discharged to:: Private residence Living Arrangements: Spouse/significant other Available Help at Discharge: Family;Available 24 hours/day Type of Home: House Home Access: Stairs to enter   [employer] of Steps: 1   Home Layout: One level Home Equipment: Shower seat - built in;Hand held shower head;Cane - single point;Rolling Environmental consultant (2 wheels);Rollator (4 wheels);Grab bars - tub/shower      Prior Function Prior Level of Function : Independent/Modified Independent;Driving;History of Falls (last six months)             Mobility Comments: pt reports prior ind with all mobility and no use of AD, 2 falls in last 6 mo due to L knee giving out or moving too quickly ADLs Comments: pt reports ind     Extremity/Trunk Assessment   Upper Extremity Assessment Upper Extremity Assessment: Overall WFL for tasks assessed    Lower Extremity Assessment Lower Extremity Assessment: Generalized weakness LLE Deficits / Details: L TKA, expected post-op strength/ROM deficits    Cervical / Trunk Assessment Cervical / Trunk Assessment: Normal  Communication   Communication Communication: No apparent difficulties Cueing Techniques: Verbal cues;Visual cues;Tactile cues  Cognition Arousal: Alert Behavior During Therapy: WFL for tasks assessed/performed Overall Cognitive Status: Within Functional Limits for tasks assessed                                          General Comments      Exercises Total Joint Exercises Ankle Circles/Pumps: AROM, Both, 10 reps, Seated (long-sitting in chair) Quad Sets: AROM, Left, 15 reps, Seated (long seated; light resistance) Long Arc Quad: AROM, Left, 10 reps, Seated Knee Flexion: AROM, Left, 10 reps, Seated, AAROM Goniometric ROM: L Knee AROM 5-97  deg Other Exercises Other Exercises: Pt educated on safety with transfers, stair navigation sequencing for safety and efficiency, and HEP per provided handout with focus on gaining full knee extension   Assessment/Plan    PT Assessment Patient needs continued PT services  PT Problem List Decreased strength;Decreased mobility;Decreased coordination;Decreased range of motion;Decreased activity tolerance;Decreased balance;Decreased knowledge of use of DME;Pain       PT Treatment Interventions DME instruction;Therapeutic exercise;Balance training;Gait training;Stair training;Neuromuscular re-education;Functional mobility training;Therapeutic activities;Patient/family education    PT Goals (Current goals can be found in the Care Plan section)  Acute Rehab PT Goals Patient Stated Goal: ind with walking and ADLs PT Goal Formulation: With patient Time For Goal Achievement: 12/11/22 Potential to Achieve Goals: Good    Frequency BID     Co-evaluation               AM-PAC PT "6 Clicks" Mobility  Outcome Measure Help needed turning from your back to your side while in a flat bed without using bedrails?: None Help needed moving from lying on your back to sitting on the side of a flat bed without using bedrails?: A Little Help needed moving to and from a bed to a chair (including a wheelchair)?: A Little Help needed standing up from a chair using your arms (e.g., wheelchair or bedside chair)?: A Little Help needed to walk in hospital room?: A Little Help needed climbing 3-5 steps with a railing? : A Little 6 Click Score: 19  End of Session Equipment Utilized During Treatment: Gait belt Activity Tolerance: Patient tolerated treatment well Patient left: in chair;with call bell/phone within reach Nurse Communication: Mobility status PT Visit Diagnosis: Other abnormalities of gait and mobility (R26.89);Muscle weakness (generalized) (M62.81);Pain Pain - Right/Left: Left Pain - part of  body: Knee    Time: 3664-4034 PT Time Calculation (min) (ACUTE ONLY): 32 min   Charges:               Rosiland Oz SPT 11/28/22, 10:46 AM This entire session was performed under direct supervision and direction of a licensed therapist/therapist assistant. I have personally read, edited and approve of the note as written.  Loran Senters, DPT

## 2022-11-28 NOTE — TOC Initial Note (Signed)
Transition of Care [facility]) - Initial/Assessment Note    Patient Details  Name: Madison Cuevas MRN: [id number] Date of Birth: [birth year]  Transition of Care [facility]) CM/SW Contact:    Marlowe Sax, RN Phone Number: 11/28/2022, 8:50 AM  Clinical Narrative:                 Patient lives at home with her Husband, She has DME at home including  Shower seat - built in;Hand held shower head;Cane - single point;Rolling Environmental consultant (2 wheels);Rollator (4 wheels);Grab bars - tub/shower  She will get a 3 in 1 delivered to the bedside by [employer] She is set u-p with [facility] for [facility] services Prior to surgery by surgeons office  Expected Discharge Plan: Home w Home Health Services Barriers to Discharge: No Barriers Identified   Patient Goals and CMS Choice            Expected Discharge Plan and Services   Discharge Planning Services: CM Consult Post Acute Care Choice: Durable Medical Equipment Living arrangements for the past 2 months: Single Family Home Expected Discharge Date: 11/28/22               DME Arranged: 3-N-1 DME Agency: [employer] Date DME Agency Contacted: 11/28/22 Time DME Agency Contacted: [phone] Representative spoke with at DME Agency: Cletis [place] HH Arranged: PT, OT HH Agency: [facility] Date [facility] Agency Contacted: 11/28/22 Time HH Agency Contacted: [phone] Representative spoke with at [facility] Agency: [place]  Prior Living Arrangements/Services Living arrangements for the past 2 months: Single Family Home Lives with:: Spouse, Self Patient language and need for interpreter reviewed:: Yes Do you feel safe going back to the place where you live?: Yes      Need for Family Participation in Patient Care: Yes (Comment) Care giver support system in place?: Yes (comment) Current home services: DME (Shower seat - built in;Hand held shower head;Cane - single point;Rolling Walker (2 wheels);Rollator (4 wheels);Grab bars - tub/shower) Criminal Activity/Legal Involvement Pertinent to  Current Situation/Hospitalization: No - Comment as needed  Activities of Daily Living   ADL Screening (condition at time of admission) Independently performs ADLs?: Yes (appropriate for developmental age) Is the patient deaf or have difficulty hearing?: No Does the patient have difficulty seeing, even when wearing glasses/contacts?: No Does the patient have difficulty concentrating, remembering, or making decisions?: No  Permission Sought/Granted   Permission granted to share information with : Yes, Verbal Permission Granted              Emotional Assessment Appearance:: Appears stated age Attitude/Demeanor/Rapport: Engaged Affect (typically observed): Pleasant, Accepting Orientation: : Oriented to Self, Oriented to Place, Oriented to  Time, Oriented to Situation Alcohol / Substance Use: Not Applicable Psych Involvement: No (comment)  Admission diagnosis:  History of total knee arthroplasty, left [Z96.652] Patient Active Problem List   Diagnosis Date Noted   History of total knee arthroplasty, left 11/27/2022   Chronic allergic conjunctivitis 07/07/2022   Cough 07/07/2022   Vasomotor rhinitis 07/07/2022   Dysarthria 10/29/2021   TIA (transient ischemic attack) 10/29/2021   DMII (diabetes mellitus, type 2) (HCC) 10/29/2021   OAB (overactive bladder) 10/29/2021   History of DVT (deep vein thrombosis) 10/29/2021   Acute metabolic encephalopathy    Slurred speech    Acute deep vein thrombosis (DVT) of popliteal vein of left lower extremity (HCC) 01/17/2021   Atherosclerosis of abdominal aorta (HCC) 07/12/2020   Hyperlipidemia, mixed 07/12/2020   Pasteurella infection 06/29/2020   Bacteremia due to Gram-negative bacteria  06/29/2020   Sepsis due to pneumonia (HCC) 06/27/2020   Primary osteoarthritis of left knee 10/31/2019   Type 2 diabetes mellitus with peripheral neuropathy (HCC) 03/18/2017   Depression, major, in remission (HCC) 09/12/2015   DDD (degenerative disc  disease), lumbar 05/05/2014   Lumbar radiculitis 05/05/2014   Esophageal reflux 08/13/2013   Urinary incontinence 08/13/2013   Benign essential hypertension 05/28/2013   PCP:  Kandyce Rud, MD Pharmacy:   [facility] [phone] [place] [address] [place] [address] Phone: [phone] Fax: [phone]     Social Determinants of Health (SDOH) Social History: SDOH Screenings   Food Insecurity: No Food Insecurity (11/27/2022)  Housing: Low Risk  (11/27/2022)  Transportation Needs: No Transportation Needs (11/27/2022)  Utilities: Not At Risk (11/27/2022)  Financial Resource Strain: Low Risk  (02/26/2022)   Received from [facility]  Tobacco Use: Medium Risk (11/27/2022)   SDOH Interventions:     Readmission Risk Interventions     No data to display

## 2022-11-28 NOTE — Plan of Care (Signed)
  Problem: Education: Goal: Knowledge of the prescribed therapeutic regimen will improve Outcome: Progressing   Problem: Activity: Goal: Ability to avoid complications of mobility impairment will improve Outcome: Progressing Goal: Range of joint motion will improve Outcome: Progressing   Problem: Pain Management: Goal: Pain level will decrease with appropriate interventions Outcome: Progressing   

## 2022-11-28 NOTE — Evaluation (Signed)
Occupational Therapy Evaluation Patient Details Name: Madison Cuevas MRN: [id number] DOB: 1945/01/22 Today's Date: 11/28/2022   History of Present Illness 77yo female s/p L TKA on 11/27/22. PMHx includes R TKA, PE/DVT (6/22), OAB, DM2, HTN, HLD, GERD, and vertigo.   Clinical Impression   Pt seen for OT evaluation this date, POD#1 from above surgery. Pt was independent in all ADLs prior to surgery and endorses 2 falls in past 63mo 2/2 turning too quickly and her L knee giving out. Pt lives with her spouse, 2 dogs, and 1 cat. Spouse will be able to provide needed level of assist at home. Pt is eager to return to PLOF with less pain and improved safety and independence. Pt currently requires minimal assist for LB bathing and dressing while in seated position due to pain and limited AROM of L knee. Pt endorsing 0/10 pain at rest up to 4-5/10 with mobility. Pt instructed in polar care mgt, falls prevention strategies, home/routines modifications, DME/AE for LB bathing and dressing tasks, pet care considerations, knee positioning at rest, and compression stocking mgt. Pt verbalized understanding. Do not currently anticipate any additional skilled OT needs following this hospitalization. Will sign off.     If plan is discharge home, recommend the following: A little help with walking and/or transfers;A little help with bathing/dressing/bathroom;Assistance with cooking/housework;Assist for transportation;Help with stairs or ramp for entrance    Functional Status Assessment  Patient has had a recent decline in their functional status and demonstrates the ability to make significant improvements in function in a reasonable and predictable amount of time.  Equipment Recommendations  BSC/3in1;Other (comment) Lexicographer)    Recommendations for Other Services       Precautions / Restrictions Precautions Precautions: Fall;Knee Precaution Comments: no KI required Restrictions Weight Bearing Restrictions:  Yes LLE Weight Bearing: Weight bearing as tolerated      Mobility Bed Mobility Overal bed mobility: Needs Assistance Bed Mobility: Supine to Sit     Supine to sit: Supervision          Transfers Overall transfer level: Needs assistance Equipment used: Rolling walker (2 wheels) Transfers: Sit to/from Stand Sit to Stand: Contact guard assist, Supervision           General transfer comment: CGA + VC for hand placement from std height bed; Supv from [facility] over toilet      Balance Overall balance assessment: Needs assistance Sitting-balance support: No upper extremity supported, Feet supported Sitting balance-Leahy Scale: Good     Standing balance support: No upper extremity supported, During functional activity Standing balance-Leahy Scale: Fair                             ADL either performed or assessed with clinical judgement   ADL Overall ADL's : Needs assistance/impaired                                       General ADL Comments: Pt currently requires MIN A for LB ADL tasks which she reports spouse is able to provide upon discharge. Indep with UB ADL. Supv for toileting (BSC over toilet) and standing grooming tasks.     Vision         Perception         Praxis         Pertinent Vitals/Pain Pain Assessment Pain Assessment: 0-10 Pain Score:  4  Pain Location: L knee with mobility Pain Descriptors / Indicators: Aching Pain Intervention(s): Monitored during session, Repositioned, Ice applied     Extremity/Trunk Assessment Upper Extremity Assessment Upper Extremity Assessment: Overall WFL for tasks assessed   Lower Extremity Assessment Lower Extremity Assessment: LLE deficits/detail LLE Deficits / Details: L TKA, expected post-op strength/ROM deficits   Cervical / Trunk Assessment Cervical / Trunk Assessment: Normal   Communication Communication Communication: No apparent difficulties   Cognition Arousal:  Alert Behavior During Therapy: WFL for tasks assessed/performed Overall Cognitive Status: Within Functional Limits for tasks assessed                                       General Comments       Exercises Other Exercises Other Exercises: Pt educated in home/routines modifications, AE/DME for ADL, falls prevention, pet care considerations, positioning of knee when resting, compression stocking mgt, and polar care mgt. Handout provided.   Shoulder Instructions      Home Living Family/patient expects to be discharged to:: Private residence Living Arrangements: Spouse/significant other Available Help at Discharge: Family;Available 24 hours/day Type of Home: House Home Access: Stairs to enter [employer] of Steps: 1   Home Layout: One level     Bathroom Shower/Tub: Producer, television/film/video: Handicapped height (has both, tall in master bedroom; short in the bathroom she uses)     Home Equipment: Shower seat - built in;Hand held shower head;Cane - single point;Rolling Environmental consultant (2 wheels);Rollator (4 wheels);Grab bars - tub/shower          Prior Functioning/Environment Prior Level of Function : Independent/Modified Independent;Driving;History of Falls (last six months)             Mobility Comments: indep ADLs Comments: 2 falls in past 7mo - turned too quickly and thinks her L knee gave out        OT Problem List: Pain;Decreased strength;Decreased range of motion;Impaired balance (sitting and/or standing)      OT Treatment/Interventions:      OT Goals(Current goals can be found in the care plan section) Acute Rehab OT Goals Patient Stated Goal: go home OT Goal Formulation: All assessment and education complete, DC therapy  OT Frequency:      Co-evaluation              AM-PAC OT "6 Clicks" Daily Activity     Outcome Measure Help from another person eating meals?: None Help from another person taking care of personal  grooming?: None Help from another person toileting, which includes using toliet, bedpan, or urinal?: None Help from another person bathing (including washing, rinsing, drying)?: A Little Help from another person to put on and taking off regular upper body clothing?: None Help from another person to put on and taking off regular lower body clothing?: A Little 6 Click Score: 22   End of Session Equipment Utilized During Treatment: Rolling walker (2 wheels)  Activity Tolerance: Patient tolerated treatment well Patient left: in chair;with call bell/phone within reach;Other (comment) (with PA)  OT Visit Diagnosis: Other abnormalities of gait and mobility (R26.89);Pain Pain - Right/Left: Left Pain - part of body: Knee                Time: 4098-1191 OT Time Calculation (min): 28 min Charges:  OT General Charges $OT Visit: 1 Visit OT Evaluation $OT Eval Low Complexity: 1 Low OT Treatments $  Self Care/Home Management : 8-22 mins  Arman Filter., MPH, MS, OTR/L ascom [phone] 11/28/22, 8:22 AM

## 2022-11-28 NOTE — Anesthesia Postprocedure Evaluation (Signed)
Anesthesia Post Note  Patient: Madison Cuevas  Procedure(s) Performed: COMPUTER ASSISTED TOTAL KNEE ARTHROPLASTY (Left: Knee)  Patient location during evaluation: Nursing Unit Anesthesia Type: Spinal Level of consciousness: oriented and awake and alert Pain management: pain level controlled Vital Signs Assessment: post-procedure vital signs reviewed and stable Respiratory status: spontaneous breathing and respiratory function stable Cardiovascular status: blood pressure returned to baseline and stable Postop Assessment: no headache, no backache, no apparent nausea or vomiting and patient able to bend at knees Anesthetic complications: no   No notable events documented.   Last Vitals:  Vitals:   11/28/22 0518 11/28/22 0726  BP: (!) 127/51 123/79  Pulse: 99 98  Resp: 16 18  Temp: 36.8 C 36.4 C  SpO2: 95% 94%    Last Pain:  Vitals:   11/28/22 0824  TempSrc:   PainSc: 0-No pain                 Starling Manns

## 2023-05-02 ENCOUNTER — Other Ambulatory Visit: Payer: Self-pay | Admitting: Cardiovascular Disease

## 2023-05-02 DIAGNOSIS — I5020 Unspecified systolic (congestive) heart failure: Secondary | ICD-10-CM

## 2023-05-08 ENCOUNTER — Emergency Department

## 2023-05-08 ENCOUNTER — Other Ambulatory Visit: Payer: Self-pay

## 2023-05-08 ENCOUNTER — Emergency Department
Admission: EM | Admit: 2023-05-08 | Discharge: 2023-05-08 | Disposition: A | Discharge status: Home / Self Care | Attending: Emergency Medicine | Admitting: Emergency Medicine

## 2023-05-08 ENCOUNTER — Encounter: Payer: Self-pay | Admitting: Emergency Medicine

## 2023-05-08 DIAGNOSIS — I82532 Chronic embolism and thrombosis of left popliteal vein: Secondary | ICD-10-CM | POA: Diagnosis not present

## 2023-05-08 DIAGNOSIS — E119 Type 2 diabetes mellitus without complications: Secondary | ICD-10-CM | POA: Insufficient documentation

## 2023-05-08 DIAGNOSIS — I82432 Acute embolism and thrombosis of left popliteal vein: Secondary | ICD-10-CM

## 2023-05-08 DIAGNOSIS — M25562 Pain in left knee: Secondary | ICD-10-CM | POA: Diagnosis present

## 2023-05-08 DIAGNOSIS — I110 Hypertensive heart disease with heart failure: Secondary | ICD-10-CM | POA: Insufficient documentation

## 2023-05-08 DIAGNOSIS — I509 Heart failure, unspecified: Secondary | ICD-10-CM | POA: Diagnosis not present

## 2023-05-08 LAB — COMPREHENSIVE METABOLIC PANEL WITH GFR
ALT: 15 U/L (ref 0–44)
AST: 18 U/L (ref 15–41)
Albumin: 3.6 g/dL (ref 3.5–5.0)
Alkaline Phosphatase: 36 U/L — ABNORMAL LOW (ref 38–126)
Anion gap: 9 (ref 5–15)
BUN: 22 mg/dL (ref 8–23)
CO2: 24 mmol/L (ref 22–32)
Calcium: 8.7 mg/dL — ABNORMAL LOW (ref 8.9–10.3)
Chloride: 102 mmol/L (ref 98–111)
Creatinine, Ser: 1 mg/dL (ref 0.44–1.00)
GFR, Estimated: 57 mL/min — ABNORMAL LOW (ref >60)
Glucose, Bld: 96 mg/dL (ref 70–99)
Potassium: 4.3 mmol/L (ref 3.5–5.1)
Sodium: 135 mmol/L (ref 135–145)
Total Bilirubin: 0.6 mg/dL (ref 0.0–1.2)
Total Protein: 6 g/dL — ABNORMAL LOW (ref 6.5–8.1)

## 2023-05-08 LAB — CBC WITH DIFFERENTIAL/PLATELET
Abs Immature Granulocytes: 0.02 10*3/uL (ref 0.00–0.07)
Basophils Absolute: 0 10*3/uL (ref 0.0–0.1)
Basophils Relative: 0 %
Eosinophils Absolute: 0.3 10*3/uL (ref 0.0–0.5)
Eosinophils Relative: 5 %
HCT: 35.1 % — ABNORMAL LOW (ref 36.0–46.0)
Hemoglobin: 11.6 g/dL — ABNORMAL LOW (ref 12.0–15.0)
Immature Granulocytes: 0 %
Lymphocytes Relative: 22 %
Lymphs Abs: 1.2 10*3/uL (ref 0.7–4.0)
MCH: 29.5 pg (ref 26.0–34.0)
MCHC: 33 g/dL (ref 30.0–36.0)
MCV: 89.3 fL (ref 80.0–100.0)
Monocytes Absolute: 0.5 10*3/uL (ref 0.1–1.0)
Monocytes Relative: 9 %
Neutro Abs: 3.6 10*3/uL (ref 1.7–7.7)
Neutrophils Relative %: 64 %
Platelets: 163 10*3/uL (ref 150–400)
RBC: 3.93 MIL/uL (ref 3.87–5.11)
RDW: 13.5 % (ref 11.5–15.5)
WBC: 5.6 10*3/uL (ref 4.0–10.5)
nRBC: 0 % (ref 0.0–0.2)

## 2023-05-08 MED ORDER — APIXABAN 5 MG PO TABS
10.0000 mg | ORAL_TABLET | Freq: Once | ORAL | Status: AC
  Administered 2023-05-08: 10 mg via ORAL
  Filled 2023-05-08: qty 2

## 2023-05-08 MED ORDER — APIXABAN (ELIQUIS) VTE STARTER PACK (10MG AND 5MG)
ORAL_TABLET | ORAL | 0 refills | Status: DC
  Filled 2023-05-09: qty 74, 30d supply, fill #0

## 2023-05-08 NOTE — ED Provider Triage Note (Signed)
 Emergency Medicine Provider Triage Evaluation Note  Madison Cuevas , a 78 y.o. female  was evaluated in triage.  Pt complains of left knee pain.  History of DVT.  No longer takes blood thinners.  No injury.  Review of Systems  Positive:  Negative:   Physical Exam  There were no vitals taken for this visit. Gen:   Awake, no distress   Resp:  Normal effort  MSK:   Moves extremities without difficulty  Other:    Medical Decision Making  Medically screening exam initiated at 2:16 PM.  Appropriate orders placed.  Madison Cuevas was informed that the remainder of the evaluation will be completed by another provider, this initial triage assessment does not replace that evaluation, and the importance of remaining in the ED until their evaluation is complete.  Ultrasound left lower extremity   Delsie Figures, PA-C 05/08/23 1417

## 2023-05-08 NOTE — ED Provider Notes (Signed)
 [facility] Provider Note    Event Date/Time   First MD Initiated Contact with Patient 05/08/23 1559     (approximate)   History   Knee Pain   HPI  Madison Cuevas is a 78 year old female with history of CHF, DVT/PE, HTN, T2DM presenting to the emergency department for evaluation of knee pain.  Yesterday, patient noticed pain with a bump over the left part of her posterior knee with associated tenderness.  No history of trauma to this area.  Does have a history of DVT in this area leading her to present to the walk-in clinic where she was directed to the ER for further evaluation.  She adamantly denies any chest pain or shortness of breath.      Physical Exam   Triage Vital Signs: ED Triage Vitals  Encounter Vitals Group     BP 05/08/23 1416 (!) 95/47     Systolic BP Percentile --      Diastolic BP Percentile --      Pulse Rate 05/08/23 1416 73     Resp 05/08/23 1416 16     Temp 05/08/23 1416 98.3 F (36.8 C)     Temp Source 05/08/23 1416 Oral     SpO2 05/08/23 1416 97 %     Weight 05/08/23 1417 186 lb (84.4 kg)     Height 05/08/23 1417 5\' 7"  (1.702 m)     Head Circumference --      Peak Flow --      Pain Score 05/08/23 1417 0     Pain Loc --      Pain Education --      Exclude from Growth Chart --     Most recent vital signs: Vitals:   05/08/23 1416 05/08/23 1838  BP: (!) 95/47 (!) 124/54  Pulse: 73 71  Resp: 16 18  Temp: 98.3 F (36.8 C) 98.2 F (36.8 C)  SpO2: 97% 99%     General: Awake, interactive  CV:  Regular rate, good peripheral perfusion.  Resp:  Unlabored respirations, clear to auscultation Abd:  Nondistended, soft, nontender Neuro:  Symmetric facial movement, fluid speech MSK:  Full range of motion of bilateral lower extremities.  Does have tenderness to palpation over the left posterior knee without significant overlying skin changes.  Faint DP pulses bilaterally confirmed via Doppler.  Intact sensation throughout  extremity.   ED Results / Procedures / Treatments   Labs (all labs ordered are listed, but only abnormal results are displayed) Labs Reviewed  CBC WITH DIFFERENTIAL/PLATELET - Abnormal; Notable for the following components:      Result Value   Hemoglobin 11.6 (*)    HCT 35.1 (*)    All other components within normal limits  COMPREHENSIVE METABOLIC PANEL WITH GFR - Abnormal; Notable for the following components:   Calcium 8.7 (*)    Total Protein 6.0 (*)    Alkaline Phosphatase 36 (*)    GFR, Estimated 57 (*)    All other components within normal limits     EKG EKG independently reviewed interpreted by myself (ER attending) demonstrates:    RADIOLOGY Imaging independently reviewed and interpreted by myself demonstrates:  US  demonstrates DVT of the left popliteal vein, radiology does note this is similar in location to prior and could be related to chronic or recurrent DVT  Formal Radiology Read:  US  Venous Img Lower Unilateral Left Result Date: 05/08/2023 CLINICAL DATA:  Left lower extremity pain.  History of prior DVT.  EXAM: Left LOWER EXTREMITY VENOUS DOPPLER ULTRASOUND TECHNIQUE: Gray-scale sonography with compression, as well as color and duplex ultrasound, were performed to evaluate the deep venous system(s) from the level of the common femoral vein through the popliteal and proximal calf veins. 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 There is occlusive hypoechoic thrombus demonstrated in 1 limb of the popliteal vein with minimal venous waveform and no flow shown on color flow Doppler imaging. Normal compressibility of the common femoral and superficial femoral, as well as the visualized calf veins. Visualized portions of profunda femoral vein and great saphenous vein unremarkable. No filling defects to suggest DVT on grayscale or color Doppler imaging. Doppler waveforms show normal direction of venous flow, normal respiratory  plasticity and response to augmentation. Limited views of the contralateral common femoral vein are unremarkable. OTHER None. Limitations: none IMPRESSION: 1. 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 Similar appearance to previous study. This could be chronic or recurrent. 2. Visualized lower extremity veins are otherwise patent. Electronically Signed   By: Boyce Byes M.D.   On: 05/08/2023 17:29    PROCEDURES:  Critical Care performed: No  Procedures   MEDICATIONS ORDERED IN ED: Medications  apixaban  (ELIQUIS ) tablet 10 mg (has no administration in time range)     IMPRESSION / MDM / ASSESSMENT AND PLAN / ED COURSE  I reviewed the triage vital signs and the nursing notes.  Differential diagnosis includes, but is not limited to, DVT, muscle strain, no evidence of cellulitis, no clinical history suggestive of PE  Patient's presentation is most consistent with acute presentation with potential threat to life or bodily function.  78 year old female presenting with atraumatic knee pain, known history of prior DVT/PE not on anticoagulation.  DVT previously noted in 2022, was on 6 months of treatment.  Ultrasound today equivocal for chronic versus new DVT.  However with clinical history of new onset knee pain consistent with the location of patient's DVT on ultrasound, do think it is appropriate to treat as an acute DVT.  Discussed with patient is comfortable with this plan.  Was previously on Eliquis  and tolerated this without issue.  No evidence of PE.  Patient is ambulatory, and reports that she can follow-up closely as an outpatient.  No proximal DVT, evidence of phlegmasia.  Labs obtained with stable renal function.  Given first dose of Eliquis  here.  Strict return precautions provided.  Patient discharged stable condition.   Clinical Course as of 05/08/23 1924  Thu May 08, 2023  1630 BP(!): 95/47 [JN]    Clinical  Course User Index [JN] Unk Garb, Student-PA     FINAL CLINICAL IMPRESSION(S) / ED DIAGNOSES   Final diagnoses:  Deep vein thrombosis (DVT) of popliteal vein of left lower extremity, unspecified chronicity (HCC)     Rx / DC Orders   ED Discharge Orders          Ordered    APIXABAN  (ELIQUIS ) VTE STARTER PACK (10MG  AND 5MG )       Note to Pharmacy: If starter pack unavailable, substitute with seventy-four 5 mg apixaban  tabs following the above SIG directions.   05/08/23 1924             Note:  This document was prepared using Dragon voice recognition software and may include unintentional dictation errors.   Claria Crofts, MD 05/08/23 [phone]

## 2023-05-08 NOTE — ED Triage Notes (Signed)
 Pt to ED via [facility] walk in. Pt sent over for left knee pain. Pt states that she has hx/o a blood clot in the same location. Pt also has hx/o PE. Pt states that she is not on blood thinner at this time.

## 2023-05-08 NOTE — Discharge Instructions (Addendum)
 You were seen in the ER today for your leg pain.  It demonstrated a blood clot in your left knee similar to the location where you had your prior clot.  We cannot say for sure if this is new, but with your new pain in this area, we will go ahead and restart you on a blood thinner and have you follow-up with the vascular surgery team for further evaluation.  Return to the ER immediately if you develop any new or worsening symptoms including chest pain, shortness of breath, worsening numbness, tingling in your leg, or any other new or concerning symptoms.

## 2023-05-08 NOTE — ED Notes (Signed)
 Pt to ED from [facility] for left knee pain with hx/o DVT and PE.

## 2023-05-09 ENCOUNTER — Other Ambulatory Visit: Payer: Self-pay

## 2023-05-27 ENCOUNTER — Encounter (INDEPENDENT_AMBULATORY_CARE_PROVIDER_SITE_OTHER): Payer: Self-pay | Admitting: Nurse Practitioner

## 2023-05-27 ENCOUNTER — Encounter (INDEPENDENT_AMBULATORY_CARE_PROVIDER_SITE_OTHER): Payer: Self-pay

## 2023-05-27 ENCOUNTER — Ambulatory Visit (INDEPENDENT_AMBULATORY_CARE_PROVIDER_SITE_OTHER): Payer: Self-pay | Admitting: Nurse Practitioner

## 2023-05-27 VITALS — BP 103/67 | HR 89 | Resp 18 | Ht 67.0 in | Wt 174.2 lb

## 2023-05-27 DIAGNOSIS — I10 Essential (primary) hypertension: Secondary | ICD-10-CM

## 2023-05-27 DIAGNOSIS — I700 Atherosclerosis of aorta: Secondary | ICD-10-CM | POA: Diagnosis not present

## 2023-05-27 DIAGNOSIS — E1142 Type 2 diabetes mellitus with diabetic polyneuropathy: Secondary | ICD-10-CM

## 2023-05-27 DIAGNOSIS — I82432 Acute embolism and thrombosis of left popliteal vein: Secondary | ICD-10-CM

## 2023-05-28 NOTE — Progress Notes (Signed)
 Subjective:    Patient ID: Madison Cuevas, female    DOB: October 07, 1945, 78 y.o.   MRN: [id number] No chief complaint on file.   The patient is a 78 year old female who presents today for evaluation of a recent DVT found on 05/08/2023 in the emergency room.  She notes that she had an area behind her knee that was swollen tender and not achy and she has had a previous history of DVT and this was concerning that there may be a recurrence.  Repeat studies were done and it noted that there was a thrombus in the same location where it was previously noted in several years ago.  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  However based on the patient's description of her symptoms and timing, I suspect she was suffering from a thrombophlebitis.  She was started on Eliquis  in the emergency room and has done well with this and notes that the pain and discomfort dissipated after about a week.  Prior to this the patient did have notable varicosities in the area.  She also has concerns for discoloration of her lower extremity as she notes that the current coloration with elevation and become darker peripheral than what is currently exhibited now.  She has not had any significant leg swelling or significant postphlebitic symptoms.    Review of Systems  Cardiovascular:  Positive for leg swelling.  All other systems reviewed and are negative.      Objective:    Physical Exam Vitals reviewed.  HENT:     Head: Normocephalic.  Cardiovascular:     Rate and Rhythm: Normal rate.  Pulmonary:     Effort: Pulmonary effort is normal.  Musculoskeletal:     Right lower leg: 1+ Edema present.     Left lower leg: 1+ Edema present.  Skin:    General: Skin is warm and dry.  Neurological:     Mental Status: She is alert and oriented to person, place, and time.  Psychiatric:        Mood and Affect: Mood normal.        Behavior: Behavior normal.         Thought Content: Thought content normal.        Judgment: Judgment normal.     BP 103/67   Pulse 89   Resp 18   Ht 5\' 7"  (1.702 m)   Wt 174 lb 3.2 oz (79 kg)   BMI 27.28 kg/m   Past Medical History:  Diagnosis Date   Acute metabolic encephalopathy 10/28/2021   a.) initally felt to be TIA (ruled out by neurology); symptoms related to medication mediated metabolic encephalopathy caused by meclizine per neurology   Aortic atherosclerosis (HCC)    BPPV (benign paroxysmal positional vertigo)    Carotid atherosclerosis, bilateral    Coronary artery calcification seen on CT scan    DDD (degenerative disc disease), lumbar    Depression    Diabetic neuropathy (HCC)    Diastolic dysfunction    a.) TTE 06/27/2020: EF 60-65%, no RWMAs, G1DD, triv MR, mild-mod MS (MPG 6 mmHg(, mild AoV calc with mild-mod sclerosis (no stenosis); b.) TEE 06/29/2020: EF 60-65%, no LAA thrombus, mild MR, triv AR, no IAS; c.) TTE 10/29/2021: EF 65-70%, no RWMAs, mild MR, triv AR   Diverticulosis    DM (diabetes mellitus), type 2 (HCC)    DVT (deep venous thrombosis) (HCC) 07/2020   GERD (gastroesophageal  reflux disease)    Herpes zoster    History of bilateral cataract extraction    History of chicken pox    Hyperlipidemia    Hypertension    Long term current use of aspirin     Mitral stenosis 06/27/2020   a.) TTE 06/27/2020: mild-mod MS (MPG 6.0 mmHg)   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   a.) surgical PCR (+) 11/20/2022 prior to LEFT TKA   OAB (overactive bladder)    Osteoarthrosis    PE (pulmonary thromboembolism) (HCC) 07/2020   a.) s/p Tx with 6 months DOAC therapy   Pneumonia    PVC's (premature ventricular contractions)    Sepsis due to gram negative bacteria (Pasturella multocida) 06/26/2020   Small vessel disease, cerebrovascular    Wears partial dentures (lower)     Social History   Socioeconomic History   Marital status: Married    Spouse name: Not on file   Number of children: Not  on file   Years of education: Not on file   Highest education level: Not on file  Occupational History   Not on file  Tobacco Use   Smoking status: Former    Current packs/day: 0.00    Average packs/day: 1 pack/day for 20.0 years (20.0 ttl pk-yrs)    Types: Cigarettes    Start date: 22    Quit date: 1999    Years since quitting: 26.4   Smokeless tobacco: Never  Vaping Use   Vaping status: Never Used  Substance and Sexual Activity   Alcohol  use: Yes    Comment: may have 1-2 drinks/month   Drug use: No   Sexual activity: Not Currently  Other Topics Concern   Not on file  Social History Narrative   Not on file   Social Drivers of Health   Financial Resource Strain: Patient Declined (03/03/2023)   Received from [facility]   Overall Financial Resource Strain (CARDIA)    Difficulty of Paying Living Expenses: Patient declined  Food Insecurity: Patient Declined (03/03/2023)   Received from [facility]   Hunger Vital Sign    Worried About Running Out of Food in the Last Year: Patient declined    Ran Out of Food in the Last Year: Patient declined  Transportation Needs: Patient Declined (03/03/2023)   Received from [facility]   PRAPARE - Transportation    In the past 12 months, has lack of transportation kept you from medical appointments or from getting medications?: Patient declined    Lack of Transportation (Non-Medical): Patient declined  Physical Activity: Not on file  Stress: Not on file  Social Connections: Not on file  Intimate Partner Violence: Not At Risk (11/27/2022)   Humiliation, Afraid, Rape, and Kick questionnaire    Fear of Current or Ex-Partner: No    Emotionally Abused: No    Physically Abused: No    Sexually Abused: No    Past Surgical History:  Procedure Laterality Date   APPENDECTOMY     BACK SURGERY     lumbar x2   CATARACT EXTRACTION W/PHACO Right 09/09/2018   Procedure: CATARACT EXTRACTION  PHACO AND INTRAOCULAR LENS PLACEMENT (IOC)  RIGHT DIABETIC  01:01.5  16.2%  9.99;  Surgeon: Annell Kidney, MD;  Location: [facility];  Service: Ophthalmology;  Laterality: Right;  Diabetic - oral meds   CATARACT EXTRACTION W/PHACO Left 10/17/2021   Procedure: CATARACT EXTRACTION PHACO AND INTRAOCULAR LENS PLACEMENT (IOC) LEFT DIABETIC;  Surgeon: Annell Kidney, MD;  Location: [facility];  Service: Ophthalmology;  Laterality: Left;  Diabetic 11.78 01:11.2   COLONOSCOPY WITH PROPOFOL  N/A 08/21/2015   Procedure: COLONOSCOPY WITH PROPOFOL ;  Surgeon: Deveron Fly, MD;  Location: [facility];  Service: Endoscopy;  Laterality: N/A;   ESOPHAGOGASTRODUODENOSCOPY (EGD) WITH PROPOFOL  N/A 06/21/2020   Procedure: ESOPHAGOGASTRODUODENOSCOPY (EGD) WITH PROPOFOL ;  Surgeon: Toledo, Alphonsus Jeans, MD;  Location: [facility];  Service: Gastroenterology;  Laterality: N/A;   JOINT REPLACEMENT Right    knee   KNEE ARTHROPLASTY Left 11/27/2022   Procedure: COMPUTER ASSISTED TOTAL KNEE ARTHROPLASTY;  Surgeon: Arlyne Lame, MD;  Location: [facility];  Service: Orthopedics;  Laterality: Left;   TEE WITHOUT CARDIOVERSION N/A 06/29/2020   Procedure: TRANSESOPHAGEAL ECHOCARDIOGRAM (TEE);  Surgeon: Michelle Aid, MD;  Location: [facility];  Service: Cardiovascular;  Laterality: N/A;   TONSILLECTOMY     TUBAL LIGATION      Family History  Problem Relation Age of Onset   Breast cancer Cousin     Allergies  Allergen Reactions   Trulicity [Dulaglutide] Nausea Only   Adhesive [Tape] Rash    With extended use       Latest Ref Rng & Units 05/08/2023    6:37 PM 11/20/2022    3:39 PM 10/28/2021    9:19 PM  CBC  WBC 4.0 - 10.5 K/uL 5.6  6.2  12.7   Hemoglobin 12.0 - 15.0 g/dL 15.1  76.1  60.7   Hematocrit 36.0 - 46.0 % 35.1  38.9  38.5   Platelets 150 - 400 K/uL 163  179  222        CMP     Component Value Date/Time   NA 135 05/08/2023 1837   NA 138 05/07/2011 0227    K 4.3 05/08/2023 1837   K 3.8 05/07/2011 0227   CL 102 05/08/2023 1837   CL 103 05/07/2011 0227   CO2 24 05/08/2023 1837   CO2 26 05/07/2011 0227   GLUCOSE 96 05/08/2023 1837   GLUCOSE 137 (H) 05/07/2011 0227   BUN 22 05/08/2023 1837   BUN 7 05/07/2011 0227   CREATININE 1.00 05/08/2023 1837   CREATININE 0.68 05/07/2011 0227   CALCIUM 8.7 (L) 05/08/2023 1837   CALCIUM 8.1 (L) 05/07/2011 0227   PROT 6.0 (L) 05/08/2023 1837   ALBUMIN 3.6 05/08/2023 1837   AST 18 05/08/2023 1837   ALT 15 05/08/2023 1837   ALKPHOS 36 (L) 05/08/2023 1837   BILITOT 0.6 05/08/2023 1837   GFRNONAA 57 (L) 05/08/2023 1837   GFRNONAA >60 05/07/2011 0227     No results found.     Assessment & Plan:   1. Acute deep vein thrombosis (DVT) of popliteal vein of left lower extremity (HCC) (Primary) Patient will remain on Eliquis  until follow-up.  In the interim she will engage in conservative therapy including use of medical grade compression, elevation and activity.  When she returns we will have the patient undergo venous reflux studies to determine the presence of reflux.  Discussed that sometimes reflux and superficial varicosities can lead to the thrombophlebitis that she had.  At that time we will also discuss intervention.  2. Atherosclerosis of abdominal aorta (HCC) The patient has some discoloration and concern for perfusion of the lower extremities.  She does have a history of atherosclerosis of the aorta.  When she returns we will have her undergo ABIs to ensure there is no underlying arterial disease.  However as discussed with the patient I suspect her discoloration is  related more so to venous insufficiency.  3. Type 2 diabetes mellitus with peripheral neuropathy (HCC) Continue hypoglycemic medications as already ordered, these medications have been reviewed and there are no changes at this time.  Hgb A1C to be monitored as already arranged by primary service  4. Benign essential  hypertension Continue antihypertensive medications as already ordered, these medications have been reviewed and there are no changes at this time.   Current Outpatient Medications on File Prior to Visit  Medication Sig Dispense Refill   amoxicillin (AMOXIL) 500 MG capsule Take 500 mg by mouth once. 1 hour prior to dental procedures     APIXABAN  (ELIQUIS ) VTE STARTER PACK (10MG  AND 5MG ) Take as directed on package: start with 2 tablets twice daily for 7 days. On day 8, switch to 1 tablet twice daily. 74 each 0   buPROPion  (WELLBUTRIN  XL) 150 MG 24 hr tablet Take 150 mg by mouth every morning.     carboxymethylcellulose (REFRESH PLUS) 0.5 % SOLN Place 1 drop into both eyes 3 (three) times daily as needed (Dry eyes).     celecoxib  (CELEBREX ) 200 MG capsule Take 1 capsule (200 mg total) by mouth 2 (two) times daily. 60 capsule 1   chlorhexidine  (HIBICLENS ) 4 % external liquid Apply 15 mLs (1 Application total) topically as directed for 30 doses. Use as directed daily for 5 days every other week for 6 weeks. 946 mL 1   Dulaglutide (TRULICITY) 1.5 MG/0.5ML SOAJ Inject 1.5 mg into the skin.     glipiZIDE  (GLUCOTROL  XL) 10 MG 24 hr tablet Take 10 mg by mouth daily with breakfast.     glipiZIDE  (GLUCOTROL  XL) 5 MG 24 hr tablet Take 5 mg by mouth at bedtime.     losartan  (COZAAR ) 25 MG tablet Take 1 tablet by mouth at bedtime.     metFORMIN  (GLUCOPHAGE -XR) 500 MG 24 hr tablet Take 1,000 mg by mouth 2 (two) times daily.     OVER THE COUNTER MEDICATION Take 2 capsules by mouth daily. Vital hair complex     oxyCODONE  (OXY IR/ROXICODONE ) 5 MG immediate release tablet Take 1 tablet (5 mg total) by mouth every 4 (four) hours as needed for moderate pain (pain score 4-6) (pain score 4-6). 30 tablet 0   progesterone  (PROMETRIUM ) 100 MG capsule Take 100 mg by mouth at bedtime.     simvastatin  (ZOCOR ) 40 MG tablet Take 40 mg by mouth at bedtime.     spironolactone (ALDACTONE) 25 MG tablet Take 25 mg by mouth daily.      tolterodine (DETROL) 2 MG tablet Take 2 mg by mouth 2 (two) times daily.     traMADol  (ULTRAM ) 50 MG tablet Take 1-2 tablets (50-100 mg total) by mouth every 4 (four) hours as needed for moderate pain (pain score 4-6). 30 tablet 0   triamcinolone  (NASACORT  ALLERGY 24HR) 55 MCG/ACT AERO nasal inhaler Place 2 sprays into the nose at bedtime.     venlafaxine  XR (EFFEXOR -XR) 75 MG 24 hr capsule Take 75 mg by mouth daily with breakfast.     pantoprazole  (PROTONIX ) 40 MG tablet Take 1 tablet (40 mg total) by mouth daily. (Patient taking differently: Take 40 mg by mouth every morning.) 30 tablet 1   No current facility-administered medications on file prior to visit.    There are no Patient Instructions on file for this visit. No follow-ups on file.   Martinique Pizzimenti E Ashana Tullo, NP

## 2023-05-29 ENCOUNTER — Ambulatory Visit
Admission: RE | Admit: 2023-05-29 | Discharge: 2023-05-29 | Disposition: A | Discharge status: Home / Self Care | Source: Ambulatory Visit | Attending: Cardiovascular Disease | Admitting: Cardiovascular Disease

## 2023-05-29 DIAGNOSIS — I5020 Unspecified systolic (congestive) heart failure: Secondary | ICD-10-CM | POA: Diagnosis present

## 2023-05-29 MED ORDER — TECHNETIUM TC 99M TETROFOSMIN IV KIT
10.1900 | PACK | Freq: Once | INTRAVENOUS | Status: AC | PRN
  Administered 2023-05-29: 10.19 via INTRAVENOUS

## 2023-05-29 MED ORDER — TECHNETIUM TC 99M TETROFOSMIN IV KIT
31.3900 | PACK | Freq: Once | INTRAVENOUS | Status: AC | PRN
  Administered 2023-05-29: 31.39 via INTRAVENOUS

## 2023-05-29 MED ORDER — REGADENOSON 0.4 MG/5ML IV SOLN
0.4000 mg | Freq: Once | INTRAVENOUS | Status: AC
  Administered 2023-05-29: 0.4 mg via INTRAVENOUS

## 2023-06-06 ENCOUNTER — Other Ambulatory Visit: Payer: Self-pay

## 2023-06-12 ENCOUNTER — Telehealth (INDEPENDENT_AMBULATORY_CARE_PROVIDER_SITE_OTHER): Payer: Self-pay | Admitting: Nurse Practitioner

## 2023-06-12 NOTE — Telephone Encounter (Signed)
 Patient called in stating she needs a refill of Eliquis . Her preferred pharmacy is [facility] on [address]. She states that they did not have it in stock when she picked up the original RX so does not know if they will have it now. Please advise patient.

## 2023-06-13 ENCOUNTER — Other Ambulatory Visit (INDEPENDENT_AMBULATORY_CARE_PROVIDER_SITE_OTHER): Payer: Self-pay

## 2023-06-13 MED ORDER — APIXABAN 5 MG PO TABS: 5.0000 mg | ORAL_TABLET | Freq: Two times a day (BID) | ORAL | 3 refills | Status: DC

## 2023-06-13 NOTE — Progress Notes (Signed)
 p

## 2023-06-13 NOTE — Telephone Encounter (Signed)
 Refill has been sent to pharmacy.

## 2023-06-22 IMAGING — MG MM DIGITAL SCREENING BILAT W/ TOMO AND CAD
8 series · 8 of 24 positions shown · non-contrast
Comparison: Previous exam(s).

CLINICAL DATA: Screening.

EXAM:
DIGITAL SCREENING BILATERAL MAMMOGRAM WITH TOMOSYNTHESIS AND CAD
TECHNIQUE: Bilateral screening digital craniocaudal and mediolateral oblique
mammograms were obtained. Bilateral screening digital breast
tomosynthesis was performed. The images were evaluated with
computer-aided detection.

[L CC synth-2D]
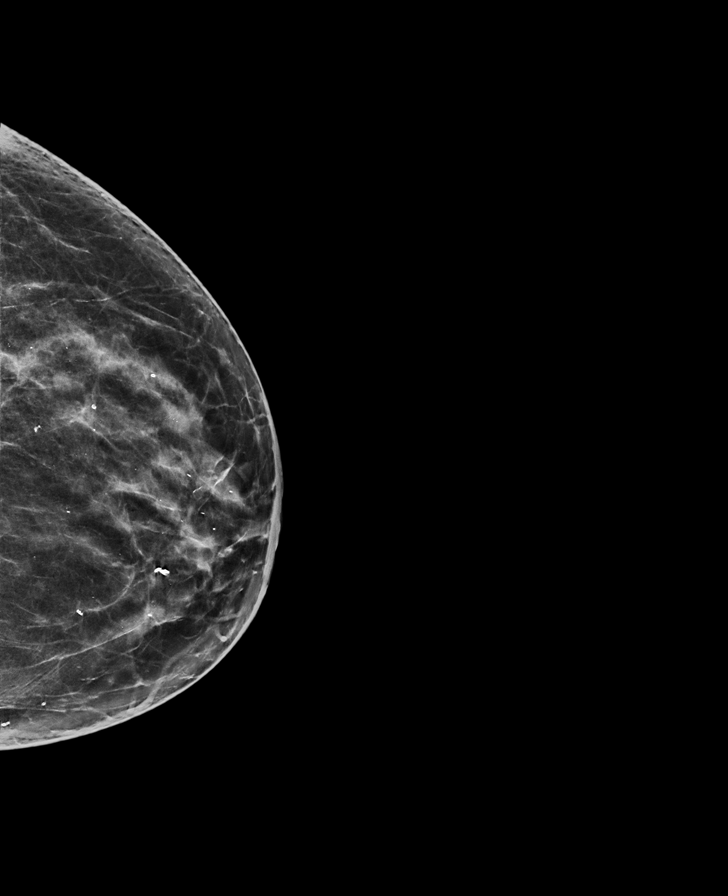

[L MLO synth-2D]
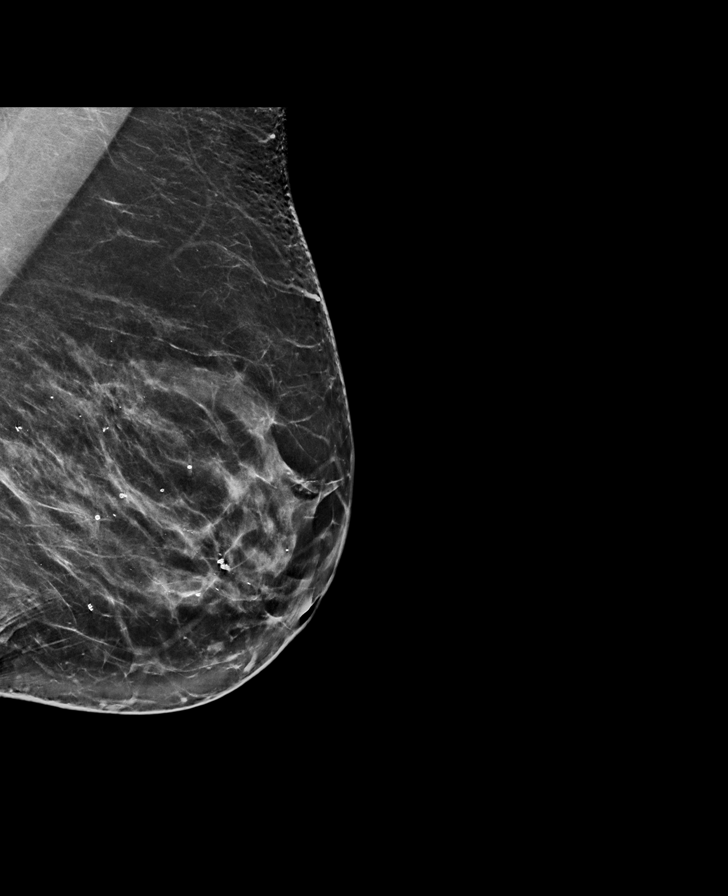

[R CC synth-2D]
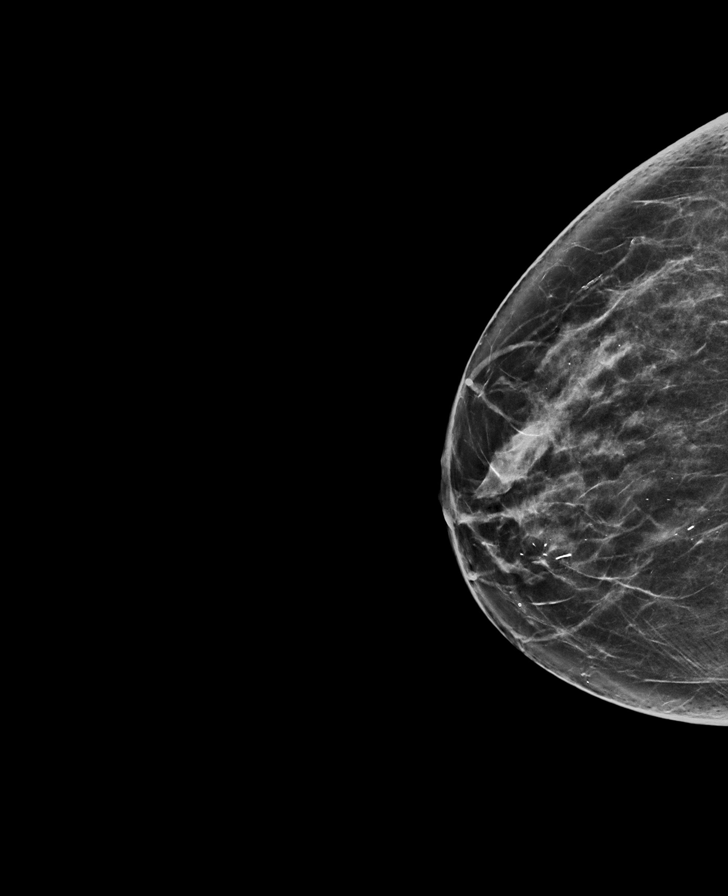

[R MLO synth-2D]
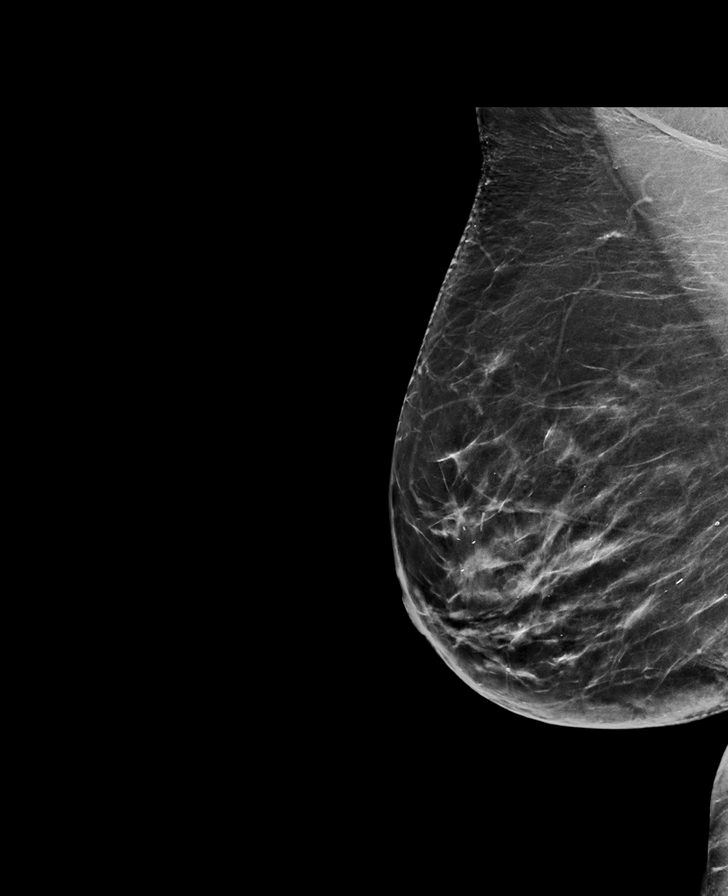

[L MLO tomo · tomo slice 39/78.0]
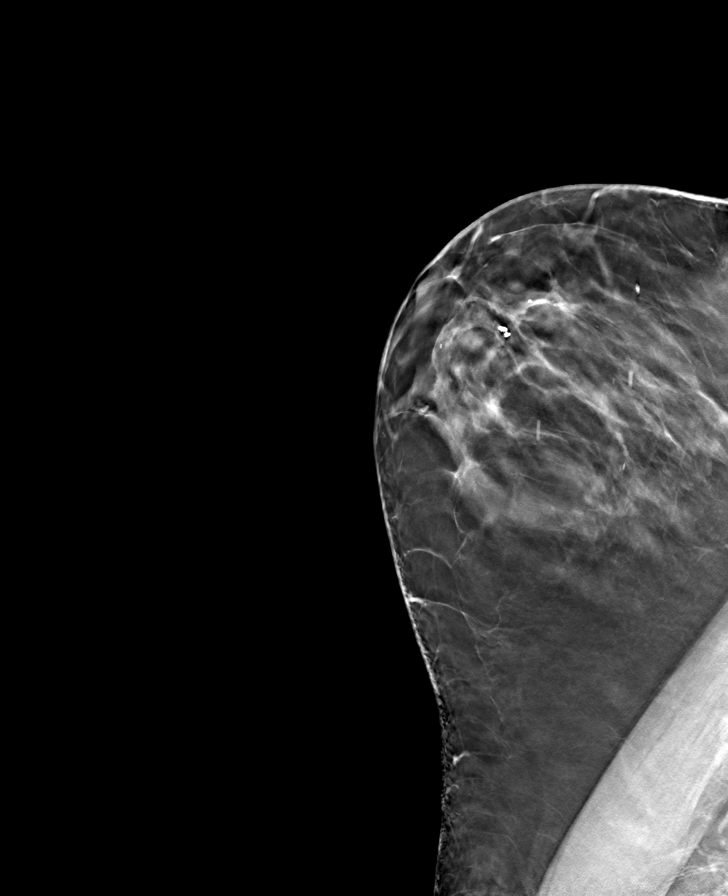

[R MLO tomo · tomo slice 39/78.0]
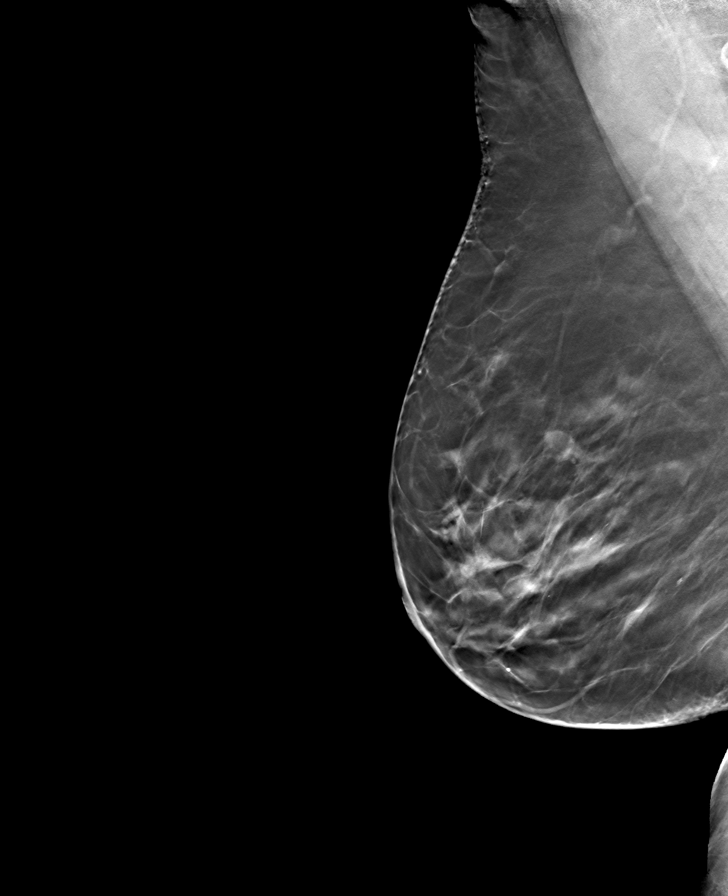

[L CC tomo · tomo slice 36/71.0]
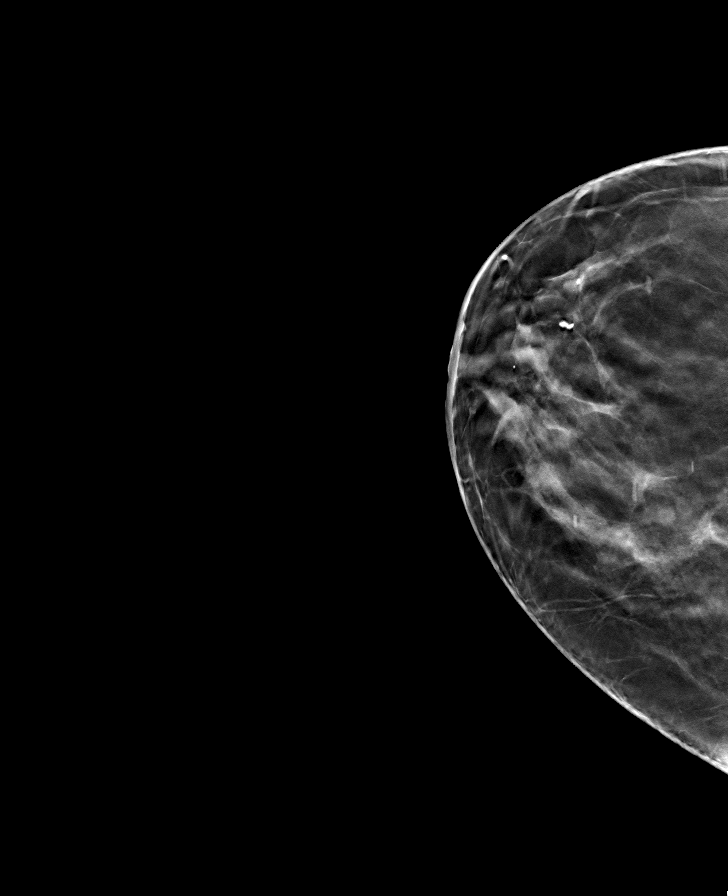

[R CC tomo · tomo slice 35/69.0]
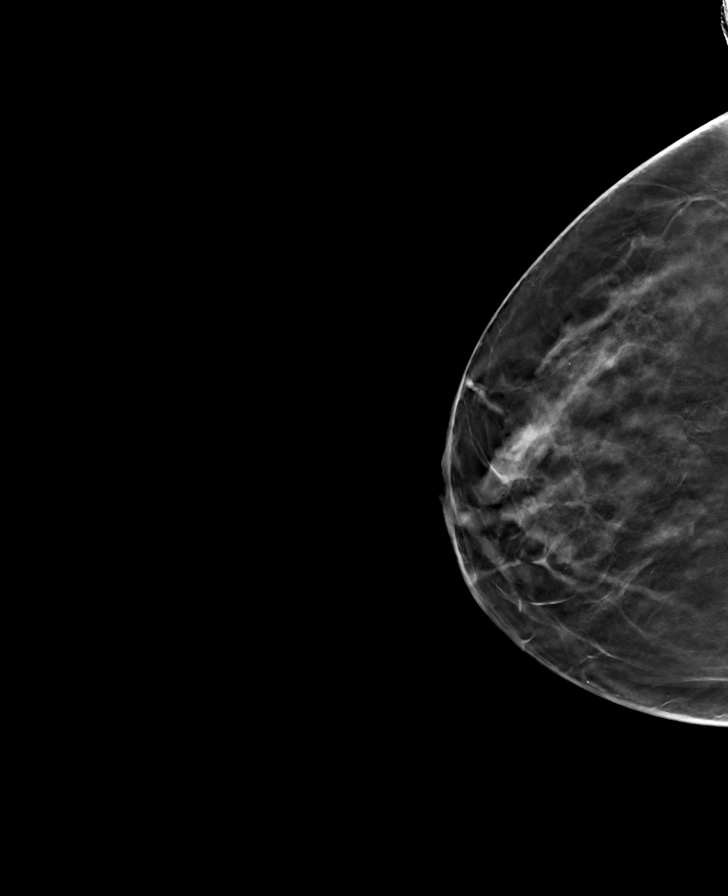

[8 of 24 positions shown; findings below may reference images not displayed]

ACR Breast Density Category c: The breast tissue is heterogeneously
dense, which may obscure small masses.
FINDINGS: There are no findings suspicious for malignancy.
IMPRESSION: No mammographic evidence of malignancy. A result letter of this
screening mammogram will be mailed directly to the patient.

RECOMMENDATION:
Screening mammogram in one year. (Code:Q3-W-BC3)

BI-RADS CATEGORY  1: Negative.

## 2023-06-25 LAB — NM MYOCAR MULTI W/SPECT W/WALL MOTION / EF
Base ST Depression (mm): 0 mm
Estimated workload: 1
Exercise duration (min): 1 min
Exercise duration (sec): 0 s
LV dias vol: 99 mL (ref 46–106)
LV sys vol: 41 mL
MPHR: 141 {beats}/min
Nuc Stress EF: 59 %
Peak HR: 93 {beats}/min
Percent HR: 65 %
Rest HR: 77 {beats}/min
Rest Nuclear Isotope Dose: 10.2 mCi
SDS: 0
SRS: 6
SSS: 5
ST Depression (mm): 0 mm
Stress Nuclear Isotope Dose: 31.4 mCi
TID: 1.05

## 2023-07-09 ENCOUNTER — Ambulatory Visit: Attending: Neurology | Admitting: Speech Pathology

## 2023-07-09 DIAGNOSIS — R41841 Cognitive communication deficit: Secondary | ICD-10-CM | POA: Insufficient documentation

## 2023-07-09 NOTE — Therapy (Unsigned)
 OUTPATIENT SPEECH LANGUAGE PATHOLOGY  COGNITION EVALUATION   Patient Name: Madison Cuevas MRN: [id number] DOB:March 11, 1945, 78 y.o., female Today's Date: 07/09/2023  PCP: Jerona Sayre, MD REFERRING PROVIDER: Jannett Fairly, MD   End of Session - 07/09/23 1517     Visit Number 1    Number of Visits 17    Date for SLP Re-Evaluation 09/03/23    Authorization Type [employer] Choice PPO    Progress Note Due on Visit 10    SLP Start Time 1400    SLP Stop Time  1445    SLP Time Calculation (min) 45 min    Activity Tolerance Patient tolerated treatment well          Past Medical History:  Diagnosis Date   Acute metabolic encephalopathy 10/28/2021   a.) initally felt to be TIA (ruled out by neurology); symptoms related to medication mediated metabolic encephalopathy caused by meclizine per neurology   Aortic atherosclerosis (HCC)    BPPV (benign paroxysmal positional vertigo)    Carotid atherosclerosis, bilateral    Coronary artery calcification seen on CT scan    DDD (degenerative disc disease), lumbar    Depression    Diabetic neuropathy (HCC)    Diastolic dysfunction    a.) TTE 06/27/2020: EF 60-65%, no RWMAs, G1DD, triv MR, mild-mod MS (MPG 6 mmHg(, mild AoV calc with mild-mod sclerosis (no stenosis); b.) TEE 06/29/2020: EF 60-65%, no LAA thrombus, mild MR, triv AR, no IAS; c.) TTE 10/29/2021: EF 65-70%, no RWMAs, mild MR, triv AR   Diverticulosis    DM (diabetes mellitus), type 2 (HCC)    DVT (deep venous thrombosis) (HCC) 07/2020   GERD (gastroesophageal reflux disease)    Herpes zoster    History of bilateral cataract extraction    History of chicken pox    Hyperlipidemia    Hypertension    Long term current use of aspirin     Mitral stenosis 06/27/2020   a.) TTE 06/27/2020: mild-mod MS (MPG 6.0 mmHg)   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   a.) surgical PCR (+) 11/20/2022 prior to LEFT TKA   OAB (overactive bladder)    Osteoarthrosis    PE (pulmonary  thromboembolism) (HCC) 07/2020   a.) s/p Tx with 6 months DOAC therapy   Pneumonia    PVC's (premature ventricular contractions)    Sepsis due to gram negative bacteria (Pasturella multocida) 06/26/2020   Small vessel disease, cerebrovascular    Wears partial dentures (lower)    Past Surgical History:  Procedure Laterality Date   APPENDECTOMY     BACK SURGERY     lumbar x2   CATARACT EXTRACTION W/PHACO Right 09/09/2018   Procedure: CATARACT EXTRACTION PHACO AND INTRAOCULAR LENS PLACEMENT (IOC)  RIGHT DIABETIC  01:01.5  16.2%  9.99;  Surgeon: Mittie Gaskin, MD;  Location: [facility];  Service: Ophthalmology;  Laterality: Right;  Diabetic - oral meds   CATARACT EXTRACTION W/PHACO Left 10/17/2021   Procedure: CATARACT EXTRACTION PHACO AND INTRAOCULAR LENS PLACEMENT (IOC) LEFT DIABETIC;  Surgeon: Mittie Gaskin, MD;  Location: [facility];  Service: Ophthalmology;  Laterality: Left;  Diabetic 11.78 01:11.2   COLONOSCOPY WITH PROPOFOL  N/A 08/21/2015   Procedure: COLONOSCOPY WITH PROPOFOL ;  Surgeon: Gladis RAYMOND Mariner, MD;  Location: [facility] END[facility]COPY;  Service: Endoscopy;  Laterality: N/A;   ESOPHAGOGASTRODUODENOSCOPY (EGD) WITH PROPOFOL  N/A 06/21/2020   Procedure: ESOPHAGOGASTRODUODENOSCOPY (EGD) WITH PROPOFOL ;  Surgeon: Toledo, Ladell POUR, MD;  Location: [facility] ENDOSCOPY;  Service: Gastroenterology;  Laterality: N/A;   JOINT REPLACEMENT  Right    knee   KNEE ARTHROPLASTY Left 11/27/2022   Procedure: COMPUTER ASSISTED TOTAL KNEE ARTHROPLASTY;  Surgeon: Mardee Lynwood SQUIBB, MD;  Location: [facility] ORS;  Service: Orthopedics;  Laterality: Left;   TEE WITHOUT CARDIOVERSION N/A 06/29/2020   Procedure: TRANSESOPHAGEAL ECHOCARDIOGRAM (TEE);  Surgeon: Hester Wolm PARAS, MD;  Location: [facility] ORS;  Service: Cardiovascular;  Laterality: N/A;   TONSILLECTOMY     TUBAL LIGATION     Patient Active Problem List   Diagnosis Date Noted   History of total knee arthroplasty, left 11/27/2022    Chronic allergic conjunctivitis 07/07/2022   Cough 07/07/2022   Vasomotor rhinitis 07/07/2022   Dysarthria 10/29/2021   TIA (transient ischemic attack) 10/29/2021   DMII (diabetes mellitus, type 2) (HCC) 10/29/2021   OAB (overactive bladder) 10/29/2021   History of DVT (deep vein thrombosis) 10/29/2021   Acute metabolic encephalopathy    Slurred speech    Acute deep vein thrombosis (DVT) of popliteal vein of left lower extremity (HCC) 01/17/2021   Atherosclerosis of abdominal aorta (HCC) 07/12/2020   Hyperlipidemia, mixed 07/12/2020   Pasteurella infection 06/29/2020   Bacteremia due to Gram-negative bacteria 06/29/2020   Sepsis due to pneumonia (HCC) 06/27/2020   Primary osteoarthritis of left knee 10/31/2019   Type 2 diabetes mellitus with peripheral neuropathy (HCC) 03/18/2017   Depression, major, in remission (HCC) 09/12/2015   DDD (degenerative disc disease), lumbar 05/05/2014   Lumbar radiculitis 05/05/2014   Esophageal reflux 08/13/2013   Urinary incontinence 08/13/2013   Benign essential hypertension 05/28/2013    ONSET DATE: onset over the last two to three years; date of referral  07/07/2023  REFERRING DIAG: G31.84 (ICD-10-CM) - Mild cognitive impairment   THERAPY DIAG:  Cognitive communication deficit  Rationale for Evaluation and Treatment Rehabilitation  SUBJECTIVE:   SUBJECTIVE STATEMENT: Pt pleasant, accompanied by her husband, eager Pt accompanied by: self and significant other  PERTINENT HISTORY and DIAGNOSTIC FINDINGS: Pt is a 78 year old female who was referred for a cognitive communication evaluation d/t concern for cognitive impairment that had a gradual onset over the last two to three years. MRI shoed mild age-related cerebral atrophy and chronic small vessel ischemic changes. Recent lab results revealed significantly low vitamin B12 level.  PAIN:  Are you having pain? No   FALLS: Has patient fallen in last 6 months?  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 function: Impaired: Problem solving Functional Impairments: pt reports difficulty with memory, continues to   AUDITORY COMPREHENSION  Overall auditory comprehension: Appears intact YES/NO questions: Appears intact Following directions: Appears intact Conversation: Simple  READING COMPREHENSION: Intact  EXPRESSION: verbal  VERBAL EXPRESSION:   Overall verbal expression: Appears intact Level of generative/spontaneous verbalization: conversation Automatic speech: name: intact and social response: intact  Repetition: Appears intact Naming: Responsive: 76-100%, Confrontation: 76-100%, Convergent: 76-100%, and Divergent: 51-75% Pragmatics: Appears intact Comments: suspect word finding might be related to short term memory deficits Interfering components: memory Non-verbal means of communication: N/A  WRITTEN EXPRESSION: Dominant hand: right Written expression: Appears intact  ORAL MOTOR EXAMINATION Facial : WFL Lingual: WFL Velum: WFL Mandible: WFL Cough: WFL Voice: WFL  MOTOR SPEECH: Overall motor speech: Appears intact Respiration: diaphragmatic/abdominal breathing Phonation:  normal Resonance: WFL Articulation: Appears intact Intelligibility: Intelligible Motor planning: Appears intact  STANDARDIZED ASSESSMENTS: Addenbrooke's Cognitive Examination - ACE III The Addenbrooke's Cognitive Examination-III (ACE-III) is a brief cognitive test that assesses five cognitive domains. The total score is 100 with higher scores indicating better cognitive functioning. Cut off scores of 88 and 82 are recommended for  suspicion of dementia (88 has sensitivity of 1.00 and specificity of 0.96, 82 has sensitivity of 0.93 and specificity of 1.00). American Version B  Attention 16/18  Memory 20/26  Fluency 8/14  Language 25/26  Visuospatial 13/16  TOTAL ACE- III Score 82/100     PATIENT REPORTED OUTCOME MEASURES (PROM): To be completed over the course of the next 3 sessions   TODAY'S TREATMENT:  N/A   PATIENT EDUCATION: Education details: results of this assessment, ST POC Person educated: Patient and Spouse Education method: Explanation Education comprehension: verbalized understanding   HOME EXERCISE PROGRAM:   N/A   GOALS:  Goals reviewed with patient? Yes  SHORT TERM GOALS: Target date: 10 sessions  *** Baseline: Goal status: {GOALSTATUS:25110}  2.  *** Baseline:  Goal status: {GOALSTATUS:25110}  3.  *** Baseline:  Goal status: {GOALSTATUS:25110}  4.  *** Baseline:  Goal status: {GOALSTATUS:25110}  5.  *** Baseline:  Goal status: {GOALSTATUS:25110}  6.  *** Baseline:  Goal status: {GOALSTATUS:25110}  LONG TERM GOALS: Target date: 09/03/2023  *** Baseline:  Goal status: {GOALSTATUS:25110}  2.  *** Baseline:  Goal status: {GOALSTATUS:25110}  3.  *** Baseline:  Goal status: {GOALSTATUS:25110}  4.  *** Baseline:  Goal status: {GOALSTATUS:25110}  5.  *** Baseline:  Goal status: {GOALSTATUS:25110}  6.  *** Baseline:  Goal status: {GOALSTATUS:25110}  ASSESSMENT:  CLINICAL IMPRESSION: Patient is a 78 y.o. female who was seen today for a cognitive communication evaluation d/t concern for progressive cognitive impairment. Pt presents with mild amnestic cognitive impairment c/b mild deficits in memory (short-term, delayed recall, prospective) as well as difficulty with verbal fluency task. Pt's strengthens include verbal expression, auditory comprehension, attention and written language.    OBJECTIVE IMPAIRMENTS include memory and executive  functioning. These impairments are limiting patient from household responsibilities, ADLs/IADLs, and effectively communicating at home and in community. Factors affecting potential to achieve goals and functional outcome are ability to learn/carryover information. Patient will benefit from skilled SLP services to address above impairments and improve overall function.  REHAB POTENTIAL: Good  PLAN: SLP FREQUENCY: 1-2x/week  SLP DURATION: 8 weeks  PLANNED INTERVENTIONS: Cognitive reorganization, Internal/external aids, Functional tasks, SLP instruction and feedback, Compensatory strategies, and Patient/family education   Rimas Gilham B. Rubbie, M.S., CCC-SLP, Tree surgeon Certified Brain Injury Specialist [facility]  [facility] Rehabilitation Services Office [phone] Ascom [phone] Fax [phone]

## 2023-07-14 ENCOUNTER — Ambulatory Visit: Admitting: Speech Pathology

## 2023-07-14 DIAGNOSIS — R41841 Cognitive communication deficit: Secondary | ICD-10-CM | POA: Diagnosis not present

## 2023-07-14 NOTE — Therapy (Signed)
 OUTPATIENT SPEECH LANGUAGE PATHOLOGY  COGNITION TREATMENT NOTE   Patient Name: Madison Cuevas MRN: [id number] DOB:07/14/1945, 78 y.o., female Today's Date: 07/14/2023  PCP: Jerona Sayre, MD REFERRING PROVIDER: Jannett Fairly, MD   End of Session - 07/14/23 1415     Visit Number 2    Number of Visits 17    Date for SLP Re-Evaluation 09/03/23    Authorization Type [employer] Choice PPO    Authorization Time Period 07/09/2023 thru 09/05/2023    Authorization - Visit Number 2    Authorization - Number of Visits 17    Progress Note Due on Visit 10    SLP Start Time 1335    SLP Stop Time  1415    SLP Time Calculation (min) 40 min    Activity Tolerance Patient tolerated treatment well          Past Medical History:  Diagnosis Date   Acute metabolic encephalopathy 10/28/2021   a.) initally felt to be TIA (ruled out by neurology); symptoms related to medication mediated metabolic encephalopathy caused by meclizine per neurology   Aortic atherosclerosis (HCC)    BPPV (benign paroxysmal positional vertigo)    Carotid atherosclerosis, bilateral    Coronary artery calcification seen on CT scan    DDD (degenerative disc disease), lumbar    Depression    Diabetic neuropathy (HCC)    Diastolic dysfunction    a.) TTE 06/27/2020: EF 60-65%, no RWMAs, G1DD, triv MR, mild-mod MS (MPG 6 mmHg(, mild AoV calc with mild-mod sclerosis (no stenosis); b.) TEE 06/29/2020: EF 60-65%, no LAA thrombus, mild MR, triv AR, no IAS; c.) TTE 10/29/2021: EF 65-70%, no RWMAs, mild MR, triv AR   Diverticulosis    DM (diabetes mellitus), type 2 (HCC)    DVT (deep venous thrombosis) (HCC) 07/2020   GERD (gastroesophageal reflux disease)    Herpes zoster    History of bilateral cataract extraction    History of chicken pox    Hyperlipidemia    Hypertension    Long term current use of aspirin     Mitral stenosis 06/27/2020   a.) TTE 06/27/2020: mild-mod MS (MPG 6.0 mmHg)   Nose colonized with MRSA  11/20/2022   a.) surgical PCR (+) 11/20/2022 prior to LEFT TKA   OAB (overactive bladder)    Osteoarthrosis    PE (pulmonary thromboembolism) (HCC) 07/2020   a.) s/p Tx with 6 months DOAC therapy   Pneumonia    PVC's (premature ventricular contractions)    Sepsis due to gram negative bacteria (Pasturella multocida) 06/26/2020   Small vessel disease, cerebrovascular    Wears partial dentures (lower)    Past Surgical History:  Procedure Laterality Date   APPENDECTOMY     BACK SURGERY     lumbar x2   CATARACT EXTRACTION W/PHACO Right 09/09/2018   Procedure: CATARACT EXTRACTION PHACO AND INTRAOCULAR LENS PLACEMENT (IOC)  RIGHT DIABETIC  01:01.5  16.2%  9.99;  Surgeon: Mittie Gaskin, MD;  Location: [facility];  Service: Ophthalmology;  Laterality: Right;  Diabetic - oral meds   CATARACT EXTRACTION W/PHACO Left 10/17/2021   Procedure: CATARACT EXTRACTION PHACO AND INTRAOCULAR LENS PLACEMENT (IOC) LEFT DIABETIC;  Surgeon: Mittie Gaskin, MD;  Location: [facility];  Service: Ophthalmology;  Laterality: Left;  Diabetic 11.78 01:11.2   COLONOSCOPY WITH PROPOFOL  N/A 08/21/2015   Procedure: COLONOSCOPY WITH PROPOFOL ;  Surgeon: Gladis RAYMOND Mariner, MD;  Location: [facility] END[facility];  Service: Endoscopy;  Laterality: N/A;   ESOPHAGOGASTRODUODENOSCOPY (EGD) WITH PROPOFOL  N/A 06/21/2020  Procedure: ESOPHAGOGASTRODUODENOSCOPY (EGD) WITH PROPOFOL ;  Surgeon: Toledo, Ladell POUR, MD;  Location: [facility] ENDOSCOPY;  Service: Gastroenterology;  Laterality: N/A;   JOINT REPLACEMENT Right    knee   KNEE ARTHROPLASTY Left 11/27/2022   Procedure: COMPUTER ASSISTED TOTAL KNEE ARTHROPLASTY;  Surgeon: Mardee Lynwood SQUIBB, MD;  Location: [facility] ORS;  Service: Orthopedics;  Laterality: Left;   TEE WITHOUT CARDIOVERSION N/A 06/29/2020   Procedure: TRANSESOPHAGEAL ECHOCARDIOGRAM (TEE);  Surgeon: Hester Wolm PARAS, MD;  Location: [facility] ORS;  Service: Cardiovascular;  Laterality: N/A;   TONSILLECTOMY      TUBAL LIGATION     Patient Active Problem List   Diagnosis Date Noted   History of total knee arthroplasty, left 11/27/2022   Chronic allergic conjunctivitis 07/07/2022   Cough 07/07/2022   Vasomotor rhinitis 07/07/2022   Dysarthria 10/29/2021   TIA (transient ischemic attack) 10/29/2021   DMII (diabetes mellitus, type 2) (HCC) 10/29/2021   OAB (overactive bladder) 10/29/2021   History of DVT (deep vein thrombosis) 10/29/2021   Acute metabolic encephalopathy    Slurred speech    Acute deep vein thrombosis (DVT) of popliteal vein of left lower extremity (HCC) 01/17/2021   Atherosclerosis of abdominal aorta (HCC) 07/12/2020   Hyperlipidemia, mixed 07/12/2020   Pasteurella infection 06/29/2020   Bacteremia due to Gram-negative bacteria 06/29/2020   Sepsis due to pneumonia (HCC) 06/27/2020   Primary osteoarthritis of left knee 10/31/2019   Type 2 diabetes mellitus with peripheral neuropathy (HCC) 03/18/2017   Depression, major, in remission (HCC) 09/12/2015   DDD (degenerative disc disease), lumbar 05/05/2014   Lumbar radiculitis 05/05/2014   Esophageal reflux 08/13/2013   Urinary incontinence 08/13/2013   Benign essential hypertension 05/28/2013    ONSET DATE: onset over the last two to three years; date of referral  07/07/2023  REFERRING DIAG: G31.84 (ICD-10-CM) - Mild cognitive impairment   THERAPY DIAG:  Cognitive communication deficit  Rationale for Evaluation and Treatment Rehabilitation  SUBJECTIVE:   PERTINENT HISTORY and DIAGNOSTIC FINDINGS: Pt is a 78 year old female who was referred for a cognitive communication evaluation d/t concern for cognitive impairment that had a gradual onset over the last two to three years. MRI shoed mild age-related cerebral atrophy and chronic small vessel ischemic changes. Recent lab results revealed significantly low vitamin B12 level.  PAIN:  Are you having pain? No   FALLS: Has patient fallen in last 6 months?  No  LIVING  ENVIRONMENT: Lives with: lives with their spouse Lives in: House/apartment  PLOF:  Level of assistance: Independent with ADLs, Independent with IADLs Employment: Retired   PATIENT GOALS   to improve memory  SUBJECTIVE STATEMENT: Pt pleasant, accompanied by her husband, they were 20 minutes late - they state that the appt was at 1:30pm; additional calendar provided during today's session Pt accompanied by: self and significant other   OBJECTIVE:   PATIENT REPORTED OUTCOME MEASURES (PROM): To be completed over the course of the next 3 sessions   TODAY'S TREATMENT:  Skilled treatment session focused on pt's cognitive communication goals. SLP facilitated session by providing the following interventions:  Skilled verbal and written information provided on compensatory memory strategies including using a notebook for medical appts, establishing and maintaining eye contact with each other to reduce distractions, hearing and memory, organization.    PATIENT EDUCATION: Education details: results of this assessment, ST POC Person educated: Patient and Spouse Education method: Explanation Education comprehension: verbalized understanding   HOME EXERCISE PROGRAM:   N/A   GOALS:  Goals reviewed with  patient? Yes  SHORT TERM GOALS: Target date: 10 sessions   With Supervision A, patient will use external aid for functional recall of completed/upcoming activities 90% accuracy.  Baseline: Goal status: INITIAL  2.   Pt. will recall 2/4 memory strategies (w-write it, r-repeat, a-associate it, p-picture) and utilize strategies in structured and functional memory tasks to recall 90% of presented information given. Baseline:  Goal status: INITIAL  3.  Pt will complete semi-complex attention tasks (selective, alternating, divided) with 80% accuracy and minimal cueing.   Baseline:  Goal status: INITIAL  4.  Pt will complete word generation tasks to address lexical retrieval given  90% accuracy given minimal cues.  Baseline:  Goal status: INITIAL   LONG TERM GOALS: Target date: 09/03/2023  To maximize preparedness for future changes, the patient will describe at least one pro-active strategy designed to prepare for typical changes associated with speech function Baseline:  Goal status: INITIAL  2.   With Mod I, patient will use strategies (ie., white board, daily planner/calendar, Apps on phone) to improve memory for important information with 95% accuracy.  Baseline:  Goal status: INITIAL  ASSESSMENT:  CLINICAL IMPRESSION: Patient is a 78 y.o. female who was seen today for a cognitive communication treatment d/t concern for progressive cognitive impairment. Pt presents with mild amnestic cognitive impairment c/b mild deficits in memory (short-term, delayed recall, prospective) as well as difficulty with verbal fluency task. Pt's strengthens include verbal expression, auditory comprehension, attention and written language.    Pt eager to have compensatory memory strategies. See the above treatment note for details.   OBJECTIVE IMPAIRMENTS include memory and executive functioning. These impairments are limiting patient from household responsibilities, ADLs/IADLs, and effectively communicating at home and in community. Factors affecting potential to achieve goals and functional outcome are ability to learn/carryover information. Patient will benefit from skilled SLP services to address above impairments and improve overall function.  REHAB POTENTIAL: Good  PLAN: SLP FREQUENCY: 1-2x/week  SLP DURATION: 8 weeks  PLANNED INTERVENTIONS: Cognitive reorganization, Internal/external aids, Functional tasks, SLP instruction and feedback, Compensatory strategies, and Patient/family education   Mariela Rex B. Rubbie, M.S., CCC-SLP, Tree surgeon Certified Brain Injury Specialist [facility]  [facility] Rehabilitation  Services Office [phone] Ascom [phone] Fax [phone]

## 2023-07-16 ENCOUNTER — Ambulatory Visit: Admitting: Speech Pathology

## 2023-07-16 DIAGNOSIS — R41841 Cognitive communication deficit: Secondary | ICD-10-CM | POA: Diagnosis not present

## 2023-07-16 NOTE — Therapy (Signed)
 OUTPATIENT SPEECH LANGUAGE PATHOLOGY  COGNITION TREATMENT NOTE DISCHARGE SUMMARY   Patient Name: NATHANIEL YADEN MRN: [id number] DOB:[birth year], 78 y.o., female Today's Date: 07/16/2023  PCP: Jerona Sayre, MD REFERRING PROVIDER: Jannett Fairly, MD   End of Session - 07/16/23 1315     Visit Number 3    Number of Visits 17    Date for SLP Re-Evaluation 09/03/23    Authorization Type [employer] PPO    Authorization Time Period 07/09/2023 thru 09/05/2023    Authorization - Visit Number 3    Authorization - Number of Visits 17    Progress Note Due on Visit 10    SLP Start Time 1315    SLP Stop Time  1400    SLP Time Calculation (min) 45 min    Activity Tolerance Patient tolerated treatment well          Past Medical History:  Diagnosis Date   Acute metabolic encephalopathy 10/28/2021   a.) initally felt to be TIA (ruled out by neurology); symptoms related to medication mediated metabolic encephalopathy caused by meclizine per neurology   Aortic atherosclerosis (HCC)    BPPV (benign paroxysmal positional vertigo)    Carotid atherosclerosis, bilateral    Coronary artery calcification seen on CT scan    DDD (degenerative disc disease), lumbar    Depression    Diabetic neuropathy (HCC)    Diastolic dysfunction    a.) TTE 06/27/2020: EF 60-65%, no RWMAs, G1DD, triv MR, mild-mod MS (MPG 6 mmHg(, mild AoV calc with mild-mod sclerosis (no stenosis); b.) TEE 06/29/2020: EF 60-65%, no LAA thrombus, mild MR, triv AR, no IAS; c.) TTE 10/29/2021: EF 65-70%, no RWMAs, mild MR, triv AR   Diverticulosis    DM (diabetes mellitus), type 2 (HCC)    DVT (deep venous thrombosis) (HCC) 07/2020   GERD (gastroesophageal reflux disease)    Herpes zoster    History of bilateral cataract extraction    History of chicken pox    Hyperlipidemia    Hypertension    Long term current use of aspirin     Mitral stenosis 06/27/2020   a.) TTE 06/27/2020: mild-mod MS (MPG 6.0 mmHg)   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   a.) surgical PCR (+) 11/20/2022 prior to LEFT TKA   OAB (overactive bladder)    Osteoarthrosis    PE (pulmonary thromboembolism) (HCC) 07/2020   a.) s/p Tx with 6 months DOAC therapy   Pneumonia    PVC's (premature ventricular contractions)    Sepsis due to gram negative bacteria (Pasturella multocida) 06/26/2020   Small vessel disease, cerebrovascular    Wears partial dentures (lower)    Past Surgical History:  Procedure Laterality Date   APPENDECTOMY     BACK SURGERY     lumbar x2   CATARACT EXTRACTION W/PHACO Right 09/09/2018   Procedure: CATARACT EXTRACTION PHACO AND INTRAOCULAR LENS PLACEMENT (IOC)  RIGHT DIABETIC  01:01.5  16.2%  9.99;  Surgeon: Mittie Gaskin, MD;  Location: [facility];  Service: Ophthalmology;  Laterality: Right;  Diabetic - oral meds   CATARACT EXTRACTION W/PHACO Left 10/17/2021   Procedure: CATARACT EXTRACTION PHACO AND INTRAOCULAR LENS PLACEMENT (IOC) LEFT DIABETIC;  Surgeon: Mittie Gaskin, MD;  Location: [facility];  Service: Ophthalmology;  Laterality: Left;  Diabetic 11.78 01:11.2   COLONOSCOPY WITH PROPOFOL  N/A 08/21/2015   Procedure: COLONOSCOPY WITH PROPOFOL ;  Surgeon: Gladis RAYMOND Mariner, MD;  Location: [facility];  Service: Endoscopy;  Laterality: N/A;   ESOPHAGOGASTRODUODENOSCOPY (EGD) WITH PROPOFOL   N/A 06/21/2020   Procedure: ESOPHAGOGASTRODUODENOSCOPY (EGD) WITH PROPOFOL ;  Surgeon: Toledo, Ladell POUR, MD;  Location: [facility] END[facility];  Service: Gastroenterology;  Laterality: N/A;   JOINT REPLACEMENT Right    knee   KNEE ARTHROPLASTY Left 11/27/2022   Procedure: COMPUTER ASSISTED TOTAL KNEE ARTHROPLASTY;  Surgeon: Mardee Lynwood SQUIBB, MD;  Location: [facility];  Service: Orthopedics;  Laterality: Left;   TEE WITHOUT CARDIOVERSION N/A 06/29/2020   Procedure: TRANSESOPHAGEAL ECHOCARDIOGRAM (TEE);  Surgeon: Hester Wolm PARAS, MD;  Location: [facility];  Service: Cardiovascular;  Laterality: N/A;    TONSILLECTOMY     TUBAL LIGATION     Patient Active Problem List   Diagnosis Date Noted   History of total knee arthroplasty, left 11/27/2022   Chronic allergic conjunctivitis 07/07/2022   Cough 07/07/2022   Vasomotor rhinitis 07/07/2022   Dysarthria 10/29/2021   TIA (transient ischemic attack) 10/29/2021   DMII (diabetes mellitus, type 2) (HCC) 10/29/2021   OAB (overactive bladder) 10/29/2021   History of DVT (deep vein thrombosis) 10/29/2021   Acute metabolic encephalopathy    Slurred speech    Acute deep vein thrombosis (DVT) of popliteal vein of left lower extremity (HCC) 01/17/2021   Atherosclerosis of abdominal aorta (HCC) 07/12/2020   Hyperlipidemia, mixed 07/12/2020   Pasteurella infection 06/29/2020   Bacteremia due to Gram-negative bacteria 06/29/2020   Sepsis due to pneumonia (HCC) 06/27/2020   Primary osteoarthritis of left knee 10/31/2019   Type 2 diabetes mellitus with peripheral neuropathy (HCC) 03/18/2017   Depression, major, in remission (HCC) 09/12/2015   DDD (degenerative disc disease), lumbar 05/05/2014   Lumbar radiculitis 05/05/2014   Esophageal reflux 08/13/2013   Urinary incontinence 08/13/2013   Benign essential hypertension 05/28/2013    ONSET DATE: onset over the last two to three years; date of referral  07/07/2023  REFERRING DIAG: G31.84 (ICD-10-CM) - Mild cognitive impairment   THERAPY DIAG:  Cognitive communication deficit  Rationale for Evaluation and Treatment Rehabilitation  SUBJECTIVE:   PERTINENT HISTORY and DIAGNOSTIC FINDINGS: Pt is a 78 year old female who was referred for a cognitive communication evaluation d/t concern for cognitive impairment that had a gradual onset over the last two to three years. MRI shoed mild age-related cerebral atrophy and chronic small vessel ischemic changes. Recent lab results revealed significantly low vitamin B12 level.  PAIN:  Are you having pain? No   FALLS: Has patient fallen in last 6  months?  No  LIVING ENVIRONMENT: Lives with: lives with their spouse Lives in: House/apartment  PLOF:  Level of assistance: Independent with ADLs, Independent with IADLs Employment: Retired   PATIENT GOALS   to improve memory  SUBJECTIVE STATEMENT: Pt arrived early for session by herself Pt accompanied by: self   OBJECTIVE:    TODAY'S TREATMENT:  Skilled treatment session focused on pt's cognitive communication goals. SLP facilitated session by providing the following interventions:  Skilled verbal and written information provided on compensatory memory strategies, social engagement and activities to promote continued cognitive stimulation (reading, puzzles, games). 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   PATIENT EDUCATION: Education details: see above Person educated: Patient and Spouse Education method: Explanation Education comprehension: verbalized understanding   HOME EXERCISE PROGRAM:   N/A   GOALS:  Goals reviewed with patient? Yes  SHORT TERM GOALS: Target date: 10 sessions   With Supervision A, patient will use external aid for functional recall of completed/upcoming activities 90%  accuracy.  Baseline: Goal status: INITIAL: MET - pt effectively using calendar well  2.   Pt. will recall 2/4 memory strategies (w-write it, r-repeat, a-associate it, p-picture) and utilize strategies in structured and functional memory tasks to recall 90% of presented information given. Baseline:  Goal status: INITIAL: Pt reports using prior to initiation of services.   3.  Pt will complete semi-complex attention tasks (selective, alternating, divided) with 80% accuracy and minimal cueing.   Baseline:  Goal status: INITIAL: MET per pt report   4.  Pt will complete word generation tasks to address lexical retrieval given 90% accuracy given minimal cues.  Baseline:  Goal  status: INITIAL: Met with one instance of word finding difficulty, pt is participating in    LONG TERM GOALS: Target date: 09/03/2023  To maximize preparedness for future changes, the patient will describe at least one pro-active strategy designed to prepare for typical changes associated with speech function Baseline:  Goal status: INITIAL: Pt able to report and states that she is already implementing  2.   With Mod I, patient will use strategies (ie., white board, daily planner/calendar, Apps on phone) to improve memory for important information with 95% accuracy.  Baseline:  Goal status: INITIAL: MET was using effectively prior to initiation of services per pt and husband report  ASSESSMENT:  CLINICAL IMPRESSION: Patient is a 78 y.o. female who was seen today for a cognitive communication treatment d/t concern for progressive cognitive impairment. Pt presents with mild amnestic cognitive impairment c/b mild deficits in memory (short-term, delayed recall, prospective) as well as difficulty with verbal fluency task. Pt's strengthens include verbal expression, auditory comprehension, attention and written language.    Pt eager to have compensatory memory strategies. See the above treatment note for details.   Per patient report, she is active and effectively using external and internal memory strategies. Per her husband's report they feel her forgetfulness is age related and doesn't impact pt's ability to perform functional ADLs or iADLs.    PLAN:  Pt has participated in cognitive assessment and education. She voices understanding with overall good implementation of the above mentioned strategies.    Concepcion Gillott B. Rubbie, M.S., CCC-SLP, Tree surgeon Certified Brain Injury Specialist [facility]  [facility] Rehabilitation Services Office [phone] Ascom [phone] Fax [phone]

## 2023-07-21 ENCOUNTER — Ambulatory Visit: Admitting: Speech Pathology

## 2023-07-23 ENCOUNTER — Ambulatory Visit: Admitting: Speech Pathology

## 2023-07-30 ENCOUNTER — Encounter: Admitting: Speech Pathology

## 2023-08-04 ENCOUNTER — Encounter: Admitting: Speech Pathology

## 2023-08-06 ENCOUNTER — Encounter: Admitting: Speech Pathology

## 2023-08-11 ENCOUNTER — Encounter: Admitting: Speech Pathology

## 2023-08-13 ENCOUNTER — Encounter: Admitting: Speech Pathology

## 2023-08-18 ENCOUNTER — Other Ambulatory Visit (INDEPENDENT_AMBULATORY_CARE_PROVIDER_SITE_OTHER): Payer: Self-pay | Admitting: Nurse Practitioner

## 2023-08-18 ENCOUNTER — Encounter: Admitting: Speech Pathology

## 2023-08-18 DIAGNOSIS — I82432 Acute embolism and thrombosis of left popliteal vein: Secondary | ICD-10-CM

## 2023-08-18 DIAGNOSIS — I700 Atherosclerosis of aorta: Secondary | ICD-10-CM

## 2023-08-20 ENCOUNTER — Ambulatory Visit (INDEPENDENT_AMBULATORY_CARE_PROVIDER_SITE_OTHER)

## 2023-08-20 ENCOUNTER — Ambulatory Visit (INDEPENDENT_AMBULATORY_CARE_PROVIDER_SITE_OTHER): Admitting: Nurse Practitioner

## 2023-08-20 DIAGNOSIS — I82432 Acute embolism and thrombosis of left popliteal vein: Secondary | ICD-10-CM

## 2023-08-20 DIAGNOSIS — I700 Atherosclerosis of aorta: Secondary | ICD-10-CM | POA: Diagnosis not present

## 2023-08-21 LAB — VAS US ABI WITH/WO TBI
Left ABI: 1.22
Right ABI: 1.34

## 2023-08-26 ENCOUNTER — Encounter (INDEPENDENT_AMBULATORY_CARE_PROVIDER_SITE_OTHER): Payer: Self-pay | Admitting: Nurse Practitioner

## 2023-08-26 ENCOUNTER — Ambulatory Visit (INDEPENDENT_AMBULATORY_CARE_PROVIDER_SITE_OTHER): Admitting: Nurse Practitioner

## 2023-08-26 VITALS — BP 113/64 | HR 91 | Ht 67.0 in | Wt 184.8 lb

## 2023-08-26 DIAGNOSIS — I10 Essential (primary) hypertension: Secondary | ICD-10-CM

## 2023-08-26 DIAGNOSIS — I82432 Acute embolism and thrombosis of left popliteal vein: Secondary | ICD-10-CM

## 2023-08-26 DIAGNOSIS — I700 Atherosclerosis of aorta: Secondary | ICD-10-CM

## 2023-08-26 DIAGNOSIS — E1142 Type 2 diabetes mellitus with diabetic polyneuropathy: Secondary | ICD-10-CM | POA: Diagnosis not present

## 2023-08-26 MED ORDER — APIXABAN 2.5 MG PO TABS: 2.5000 mg | ORAL_TABLET | Freq: Two times a day (BID) | ORAL | 11 refills | Status: AC

## 2023-08-31 ENCOUNTER — Encounter (INDEPENDENT_AMBULATORY_CARE_PROVIDER_SITE_OTHER): Payer: Self-pay | Admitting: Nurse Practitioner

## 2023-08-31 NOTE — Progress Notes (Signed)
 Subjective:    Patient ID: Madison Cuevas, female    DOB: [birth year], 77 y.o.   MRN: [id number] Chief Complaint  Patient presents with   Follow-up    Bilat Venouse Reflux + ABI see JD/FB     The patient is a 78 year old female who presents today for evaluation of a recent DVT found on 05/08/2023 in the emergency room.  She notes that she had an area behind her knee that was swollen tender and not achy and she has had a previous history of DVT and this was concerning that there may be a recurrence.  Repeat studies were done and it noted that there was a thrombus in the same location where it was previously noted in several years ago.  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  However based on the patient's description of her symptoms and timing, I suspect she was suffering from a thrombophlebitis.  She was started on Eliquis  in the emergency room and has done well with this and notes that the pain and discomfort dissipated after about a week.  Prior to this the patient did have notable varicosities in the area.  She also has concerns for discoloration of her lower extremity as she notes that the current coloration with elevation and become darker peripheral than what is currently exhibited now.  She has not had any significant leg swelling or significant postphlebitic symptoms.  Today noninvasive study showed no evidence of DVT or thrombophlebitis in the left lower extremity.  No evidence of deep venous insufficiency or superficial venous reflux noted.  She has an ABI of 1.34 on the right and 1.22 on the left.  She has strong triphasic tibial artery waveforms with good toe waveforms bilaterally.    Review of Systems  All other systems reviewed and are negative.      Objective:   Physical Exam Vitals reviewed.  HENT:     Head: Normocephalic.  Cardiovascular:     Rate and Rhythm: Normal rate.     Pulses: Normal pulses.   Pulmonary:     Effort: Pulmonary effort is normal.  Skin:    General: Skin is warm and dry.  Neurological:     Mental Status: She is alert and oriented to person, place, and time.  Psychiatric:        Mood and Affect: Mood normal.        Behavior: Behavior normal.        Thought Content: Thought content normal.        Judgment: Judgment normal.     BP 113/64   Pulse 91   Ht 5' 7 (1.702 m)   Wt 184 lb 12.8 oz (83.8 kg)   BMI 28.94 kg/m   Past Medical History:  Diagnosis Date   Acute metabolic encephalopathy 10/28/2021   a.) initally felt to be TIA (ruled out by neurology); symptoms related to medication mediated metabolic encephalopathy caused by meclizine per neurology   Aortic atherosclerosis (HCC)    BPPV (benign paroxysmal positional vertigo)    Carotid atherosclerosis, bilateral    Coronary artery calcification seen on CT scan    DDD (degenerative disc disease), lumbar    Depression    Diabetic neuropathy (HCC)    Diastolic dysfunction    a.) TTE 06/27/2020: EF 60-65%, no RWMAs, G1DD, triv MR, mild-mod MS (MPG 6 mmHg(, mild AoV calc with mild-mod sclerosis (no stenosis); b.) TEE 06/29/2020: EF 60-65%,  no LAA thrombus, mild MR, triv AR, no IAS; c.) TTE 10/29/2021: EF 65-70%, no RWMAs, mild MR, triv AR   Diverticulosis    DM (diabetes mellitus), type 2 (HCC)    DVT (deep venous thrombosis) (HCC) 07/2020   GERD (gastroesophageal reflux disease)    Herpes zoster    History of bilateral cataract extraction    History of chicken pox    Hyperlipidemia    Hypertension    Long term current use of aspirin     Mitral stenosis 06/27/2020   a.) TTE 06/27/2020: mild-mod MS (MPG 6.0 mmHg)   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   a.) surgical PCR (+) 11/20/2022 prior to LEFT TKA   OAB (overactive bladder)    Osteoarthrosis    PE (pulmonary thromboembolism) (HCC) 07/2020   a.) s/p Tx with 6 months DOAC therapy   Pneumonia    PVC's (premature ventricular contractions)     Sepsis due to gram negative bacteria (Pasturella multocida) 06/26/2020   Small vessel disease, cerebrovascular    Wears partial dentures (lower)     Social History   Socioeconomic History   Marital status: Married    Spouse name: Not on file   Number of children: Not on file   Years of education: Not on file   Highest education level: Not on file  Occupational History   Not on file  Tobacco Use   Smoking status: Former    Current packs/day: 0.00    Average packs/day: 1 pack/day for 20.0 years (20.0 ttl pk-yrs)    Types: Cigarettes    Start date: 33    Quit date: 1999    Years since quitting: 26.6   Smokeless tobacco: Never  Vaping Use   Vaping status: Never Used  Substance and Sexual Activity   Alcohol  use: Yes    Comment: may have 1-2 drinks/month   Drug use: No   Sexual activity: Not Currently  Other Topics Concern   Not on file  Social History Narrative   Not on file   Social Drivers of Health   Financial Resource Strain: Low Risk  (07/02/2023)   Received from [facility]   Overall Financial Resource Strain (CARDIA)    Difficulty of Paying Living Expenses: Not hard at all  Food Insecurity: No Food Insecurity (07/02/2023)   Received from [facility]   Hunger Vital Sign    Within the past 12 months, you worried that your food would run out before you got the money to buy more.: Never true    Within the past 12 months, the food you bought just didn't last and you didn't have money to get more.: Never true  Transportation Needs: No Transportation Needs (07/02/2023)   Received from [facility] - Transportation    In the past 12 months, has lack of transportation kept you from medical appointments or from getting medications?: No    Lack of Transportation (Non-Medical): No  Physical Activity: Not on file  Stress: Not on file  Social Connections: Not on file  Intimate Partner Violence: Not At Risk  (11/27/2022)   Humiliation, Afraid, Rape, and Kick questionnaire    Fear of Current or Ex-Partner: No    Emotionally Abused: No    Physically Abused: No    Sexually Abused: No    Past Surgical History:  Procedure Laterality Date   APPENDECTOMY     BACK SURGERY     lumbar x2   CATARACT  EXTRACTION W/PHACO Right 09/09/2018   Procedure: CATARACT EXTRACTION PHACO AND INTRAOCULAR LENS PLACEMENT (IOC)  RIGHT DIABETIC  01:01.5  16.2%  9.99;  Surgeon: Mittie Gaskin, MD;  Location: [facility];  Service: Ophthalmology;  Laterality: Right;  Diabetic - oral meds   CATARACT EXTRACTION W/PHACO Left 10/17/2021   Procedure: CATARACT EXTRACTION PHACO AND INTRAOCULAR LENS PLACEMENT (IOC) LEFT DIABETIC;  Surgeon: Mittie Gaskin, MD;  Location: [facility];  Service: Ophthalmology;  Laterality: Left;  Diabetic 11.78 01:11.2   COLONOSCOPY WITH PROPOFOL  N/A 08/21/2015   Procedure: COLONOSCOPY WITH PROPOFOL ;  Surgeon: Gladis RAYMOND Mariner, MD;  Location: [facility];  Service: Endoscopy;  Laterality: N/A;   ESOPHAGOGASTRODUODENOSCOPY (EGD) WITH PROPOFOL  N/A 06/21/2020   Procedure: ESOPHAGOGASTRODUODENOSCOPY (EGD) WITH PROPOFOL ;  Surgeon: Toledo, Ladell POUR, MD;  Location: [facility] END[facility];  Service: Gastroenterology;  Laterality: N/A;   JOINT REPLACEMENT Right    knee   KNEE ARTHROPLASTY Left 11/27/2022   Procedure: COMPUTER ASSISTED TOTAL KNEE ARTHROPLASTY;  Surgeon: Mardee Lynwood SQUIBB, MD;  Location: [facility] ORS;  Service: Orthopedics;  Laterality: Left;   TEE WITHOUT CARDIOVERSION N/A 06/29/2020   Procedure: TRANSESOPHAGEAL ECHOCARDIOGRAM (TEE);  Surgeon: Hester Wolm PARAS, MD;  Location: [facility] OR[facility];  Service: Cardiovascular;  Laterality: N/A;   TONSILLECTOMY     TUBAL LIGATION      Family History  Problem Relation Age of Onset   Breast cancer Cousin     Allergies  Allergen Reactions   Trulicity [Dulaglutide] Nausea Only   Adhesive [Tape] Rash    With extended use        Latest Ref Rng & Units 05/08/2023    6:37 PM 11/20/2022    3:39 PM 10/28/2021    9:19 PM  CBC  WBC 4.0 - 10.5 K/uL 5.6  6.2  12.7   Hemoglobin 12.0 - 15.0 g/dL 88.3  87.0  87.2   Hematocrit 36.0 - 46.0 % 35.1  38.9  38.5   Platelets 150 - 400 K/uL 163  179  222       CMP     Component Value Date/Time   NA 135 05/08/2023 1837   NA 138 05/07/2011 0227   K 4.3 05/08/2023 1837   K 3.8 05/07/2011 0227   CL 102 05/08/2023 1837   CL 103 05/07/2011 0227   CO2 24 05/08/2023 1837   CO2 26 05/07/2011 0227   GLUCOSE 96 05/08/2023 1837   GLUCOSE 137 (H) 05/07/2011 0227   BUN 22 05/08/2023 1837   BUN 7 05/07/2011 0227   CREATININE 1.00 05/08/2023 1837   CREATININE 0.68 05/07/2011 0227   CALCIUM 8.7 (L) 05/08/2023 1837   CALCIUM 8.1 (L) 05/07/2011 0227   PROT 6.0 (L) 05/08/2023 1837   ALBUMIN 3.6 05/08/2023 1837   AST 18 05/08/2023 1837   ALT 15 05/08/2023 1837   ALKPHOS 36 (L) 05/08/2023 1837   BILITOT 0.6 05/08/2023 1837   GFRNONAA 57 (L) 05/08/2023 1837   GFRNONAA >60 05/07/2011 0227     VAS US  ABI WITH/WO TBI Result Date: 08/21/2023  LOWER EXTREMITY DOPPLER STUDY Patient Name:  Madison Cuevas  Date of Exam:   08/20/2023 Medical Rec #: [id number]       Accession #:    [id number] Date of Birth: [birth year]       Patient Gender: F Patient Age:   70 years Exam Location:  [facility] Procedure:      VAS US  ABI WITH/WO TBI Referring Phys: Selinda Gu --------------------------------------------------------------------------------  Indications: Rest pain.  Performing Technologist: Leafy Gibes RVS  Examination Guidelines: A complete evaluation includes at minimum, Doppler waveform signals and systolic blood pressure reading at the level of bilateral brachial, anterior tibial, and posterior tibial arteries, when vessel segments are accessible. Bilateral testing is considered an integral part of a complete examination. Photoelectric Plethysmograph (PPG) waveforms and toe systolic  pressure readings are included as required and additional duplex testing as needed. Limited examinations for reoccurring indications may be performed as noted.  ABI Findings: +---------+------------------+-----+---------+--------+ Right    Rt Pressure (mmHg)IndexWaveform Comment  +---------+------------------+-----+---------+--------+ Brachial 128                                      +---------+------------------+-----+---------+--------+ ATA      152               1.19 triphasic         +---------+------------------+-----+---------+--------+ PTA      172               1.34 triphasic         +---------+------------------+-----+---------+--------+ Burnetta Reedy               1.04 Normal            +---------+------------------+-----+---------+--------+ +---------+------------------+-----+---------+-------+ Left     Lt Pressure (mmHg)IndexWaveform Comment +---------+------------------+-----+---------+-------+ Brachial 127                                     +---------+------------------+-----+---------+-------+ ATA      147               1.15 biphasic         +---------+------------------+-----+---------+-------+ PTA      156               1.22 triphasic        +---------+------------------+-----+---------+-------+ Great Toe129               1.01 Normal           +---------+------------------+-----+---------+-------+ +-------+-----------+-----------+------------+------------+ ABI/TBIToday's ABIToday's TBIPrevious ABIPrevious TBI +-------+-----------+-----------+------------+------------+ Right  1.34       1.04                                +-------+-----------+-----------+------------+------------+ Left   1.22       1.01                                +-------+-----------+-----------+------------+------------+  Summary: Right: Resting right ankle-brachial index is within normal range. The right toe-brachial index is normal. Left: Resting  left ankle-brachial index is within normal range. The left toe-brachial index is normal. *See table(s) above for measurements and observations.  Electronically signed by Selinda Gu MD on 08/21/2023 at 7:26:39 AM.    Final        Assessment & Plan:   1. Acute deep vein thrombosis (DVT) of popliteal vein of left lower extremity (HCC) (Primary) Previous thrombophlebitis has resolved.  Given the fact that the patient has had recurrence we have had a discussion regarding anticoagulation.  At this time the patient will move forward with Eliquis  2.5 mg twice daily she has tolerated this well without significant issue.  Will see patient return in 1 year or sooner if  issues arise.  We also again discussed postphlebitic symptoms and advised her to continue with use of medical grade compression elevation and activity as conservative therapies. - apixaban  (ELIQUIS ) 2.5 MG TABS tablet; Take 1 tablet (2.5 mg total) by mouth 2 (two) times daily.  Dispense: 60 tablet; Refill: 11  2. Atherosclerosis of abdominal aorta (HCC) Today noninvasive studies show no evidence of significant peripheral arterial disease.  No intervention recommended at this time.  Patient to continue with statin.  3. Type 2 diabetes mellitus with peripheral neuropathy (HCC) Continue hypoglycemic medications as already ordered, these medications have been reviewed and there are no changes at this time.  Hgb A1C to be monitored as already arranged by primary service  4. Benign essential hypertension Continue antihypertensive medications as already ordered, these medications have been reviewed and there are no changes at this time.   Current Outpatient Medications on File Prior to Visit  Medication Sig Dispense Refill   amoxicillin (AMOXIL) 500 MG capsule Take 500 mg by mouth once. 1 hour prior to dental procedures     buPROPion  (WELLBUTRIN  XL) 150 MG 24 hr tablet Take 150 mg by mouth every morning.     carboxymethylcellulose (REFRESH  PLUS) 0.5 % SOLN Place 1 drop into both eyes 3 (three) times daily as needed (Dry eyes).     chlorhexidine  (HIBICLENS ) 4 % external liquid Apply 15 mLs (1 Application total) topically as directed for 30 doses. Use as directed daily for 5 days every other week for 6 weeks. 946 mL 1   glipiZIDE  (GLUCOTROL  XL) 10 MG 24 hr tablet Take 10 mg by mouth daily with breakfast.     glipiZIDE  (GLUCOTROL  XL) 5 MG 24 hr tablet Take 5 mg by mouth at bedtime.     losartan  (COZAAR ) 25 MG tablet Take 1 tablet by mouth at bedtime.     metFORMIN  (GLUCOPHAGE -XR) 500 MG 24 hr tablet Take 1,000 mg by mouth 2 (two) times daily.     OVER THE COUNTER MEDICATION Take 2 capsules by mouth daily. Vital hair complex     oxyCODONE  (OXY IR/ROXICODONE ) 5 MG immediate release tablet Take 1 tablet (5 mg total) by mouth every 4 (four) hours as needed for moderate pain (pain score 4-6) (pain score 4-6). 30 tablet 0   pantoprazole  (PROTONIX ) 40 MG tablet Take 1 tablet (40 mg total) by mouth daily. 30 tablet 1   simvastatin  (ZOCOR ) 40 MG tablet Take 40 mg by mouth at bedtime.     spironolactone (ALDACTONE) 25 MG tablet Take 25 mg by mouth daily.     tolterodine (DETROL) 2 MG tablet Take 2 mg by mouth 2 (two) times daily.     triamcinolone  (NASACORT  ALLERGY 24HR) 55 MCG/ACT AERO nasal inhaler Place 2 sprays into the nose at bedtime.     venlafaxine  XR (EFFEXOR -XR) 75 MG 24 hr capsule Take 75 mg by mouth daily with breakfast.     celecoxib  (CELEBREX ) 200 MG capsule Take 1 capsule (200 mg total) by mouth 2 (two) times daily. (Patient not taking: Reported on 08/26/2023) 60 capsule 1   Dulaglutide (TRULICITY) 1.5 MG/0.5ML SOAJ Inject 1.5 mg into the skin.     progesterone  (PROMETRIUM ) 100 MG capsule Take 100 mg by mouth at bedtime.     traMADol  (ULTRAM ) 50 MG tablet Take 1-2 tablets (50-100 mg total) by mouth every 4 (four) hours as needed for moderate pain (pain score 4-6). 30 tablet 0   No current facility-administered medications on file  prior to  visit.    There are no Patient Instructions on file for this visit. No follow-ups on file.   Miku Udall E Grizel Vesely, NP

## 2023-10-01 ENCOUNTER — Other Ambulatory Visit: Payer: Self-pay | Admitting: Family Medicine

## 2023-10-01 DIAGNOSIS — Z1231 Encounter for screening mammogram for malignant neoplasm of breast: Secondary | ICD-10-CM

## 2023-11-10 ENCOUNTER — Ambulatory Visit
Admission: RE | Admit: 2023-11-10 | Discharge: 2023-11-10 | Disposition: A | Discharge status: Home / Self Care | Source: Ambulatory Visit | Attending: Family Medicine | Admitting: Family Medicine

## 2023-11-10 DIAGNOSIS — Z1231 Encounter for screening mammogram for malignant neoplasm of breast: Secondary | ICD-10-CM | POA: Insufficient documentation

## 2023-12-25 ENCOUNTER — Other Ambulatory Visit (INDEPENDENT_AMBULATORY_CARE_PROVIDER_SITE_OTHER): Payer: Self-pay | Admitting: Nurse Practitioner

## 2024-01-15 ENCOUNTER — Other Ambulatory Visit: Payer: Self-pay

## 2024-02-02 ENCOUNTER — Other Ambulatory Visit: Payer: Self-pay

## 2024-02-03 ENCOUNTER — Other Ambulatory Visit (HOSPITAL_COMMUNITY): Payer: Self-pay

## 2024-08-24 ENCOUNTER — Ambulatory Visit (INDEPENDENT_AMBULATORY_CARE_PROVIDER_SITE_OTHER): Admitting: Nurse Practitioner
